# Patient Record
Sex: Female | Born: 1960 | Race: White | Hispanic: No | Marital: Married | Smoking: Never smoker
Health system: Southern US, Community
[De-identification: ages and names within clinical notes are randomized; demographics above are authoritative.]

## PROBLEM LIST (undated history)

## (undated) DIAGNOSIS — T7840XA Allergy, unspecified, initial encounter: Secondary | ICD-10-CM

## (undated) DIAGNOSIS — D1800 Hemangioma unspecified site: Secondary | ICD-10-CM

## (undated) DIAGNOSIS — C4491 Basal cell carcinoma of skin, unspecified: Secondary | ICD-10-CM

## (undated) DIAGNOSIS — R011 Cardiac murmur, unspecified: Secondary | ICD-10-CM

## (undated) DIAGNOSIS — M810 Age-related osteoporosis without current pathological fracture: Secondary | ICD-10-CM

## (undated) DIAGNOSIS — E785 Hyperlipidemia, unspecified: Secondary | ICD-10-CM

## (undated) DIAGNOSIS — F988 Other specified behavioral and emotional disorders with onset usually occurring in childhood and adolescence: Secondary | ICD-10-CM

## (undated) DIAGNOSIS — F419 Anxiety disorder, unspecified: Secondary | ICD-10-CM

## (undated) DIAGNOSIS — M1990 Unspecified osteoarthritis, unspecified site: Secondary | ICD-10-CM

## (undated) DIAGNOSIS — H538 Other visual disturbances: Secondary | ICD-10-CM

## (undated) DIAGNOSIS — D229 Melanocytic nevi, unspecified: Secondary | ICD-10-CM

## (undated) DIAGNOSIS — J302 Other seasonal allergic rhinitis: Secondary | ICD-10-CM

## (undated) DIAGNOSIS — H269 Unspecified cataract: Secondary | ICD-10-CM

## (undated) HISTORY — DX: Anxiety disorder, unspecified: F41.9

## (undated) HISTORY — DX: Allergy, unspecified, initial encounter: T78.40XA

## (undated) HISTORY — DX: Age-related osteoporosis without current pathological fracture: M81.0

## (undated) HISTORY — DX: Unspecified osteoarthritis, unspecified site: M19.90

## (undated) HISTORY — DX: Cardiac murmur, unspecified: R01.1

## (undated) HISTORY — DX: Other specified behavioral and emotional disorders with onset usually occurring in childhood and adolescence: F98.8

## (undated) HISTORY — PX: BREAST SURGERY: SHX581

## (undated) HISTORY — PX: BREAST EXCISIONAL BIOPSY: SUR124

## (undated) HISTORY — DX: Melanocytic nevi, unspecified: D22.9

## (undated) HISTORY — DX: Hyperlipidemia, unspecified: E78.5

## (undated) HISTORY — DX: Other visual disturbances: H53.8

## (undated) HISTORY — PX: EYE SURGERY: SHX253

## (undated) HISTORY — DX: Unspecified cataract: H26.9

## (undated) HISTORY — DX: Basal cell carcinoma of skin, unspecified: C44.91

---

## 1995-01-11 DIAGNOSIS — D229 Melanocytic nevi, unspecified: Secondary | ICD-10-CM

## 1995-01-11 HISTORY — DX: Melanocytic nevi, unspecified: D22.9

## 1997-12-23 ENCOUNTER — Other Ambulatory Visit: Admission: RE | Admit: 1997-12-23 | Discharge: 1997-12-23 | Payer: Self-pay | Admitting: Obstetrics and Gynecology

## 1998-01-05 ENCOUNTER — Inpatient Hospital Stay (HOSPITAL_COMMUNITY): Admission: AD | Admit: 1998-01-05 | Discharge: 1998-01-05 | Payer: Self-pay | Admitting: Gynecology

## 1998-01-06 ENCOUNTER — Ambulatory Visit (HOSPITAL_COMMUNITY): Admission: RE | Admit: 1998-01-06 | Discharge: 1998-01-06 | Payer: Self-pay | Admitting: Gynecology

## 1998-06-09 ENCOUNTER — Other Ambulatory Visit: Admission: RE | Admit: 1998-06-09 | Discharge: 1998-06-09 | Payer: Self-pay | Admitting: Obstetrics and Gynecology

## 1998-08-30 ENCOUNTER — Inpatient Hospital Stay (HOSPITAL_COMMUNITY): Admission: AD | Admit: 1998-08-30 | Discharge: 1998-08-30 | Payer: Self-pay | Admitting: Gynecology

## 1999-01-12 ENCOUNTER — Inpatient Hospital Stay (HOSPITAL_COMMUNITY): Admission: AD | Admit: 1999-01-12 | Discharge: 1999-01-15 | Payer: Self-pay | Admitting: Obstetrics and Gynecology

## 1999-02-19 ENCOUNTER — Other Ambulatory Visit: Admission: RE | Admit: 1999-02-19 | Discharge: 1999-02-19 | Payer: Self-pay | Admitting: Obstetrics and Gynecology

## 2000-03-22 ENCOUNTER — Other Ambulatory Visit: Admission: RE | Admit: 2000-03-22 | Discharge: 2000-03-22 | Payer: Self-pay | Admitting: Gynecology

## 2001-03-29 ENCOUNTER — Other Ambulatory Visit: Admission: RE | Admit: 2001-03-29 | Discharge: 2001-03-29 | Payer: Self-pay | Admitting: Gynecology

## 2001-06-18 ENCOUNTER — Other Ambulatory Visit: Admission: RE | Admit: 2001-06-18 | Discharge: 2001-06-18 | Payer: Self-pay | Admitting: Gynecology

## 2001-11-19 ENCOUNTER — Inpatient Hospital Stay (HOSPITAL_COMMUNITY): Admission: AD | Admit: 2001-11-19 | Discharge: 2001-11-19 | Payer: Self-pay | Admitting: Gynecology

## 2002-01-10 ENCOUNTER — Inpatient Hospital Stay (HOSPITAL_COMMUNITY): Admission: AD | Admit: 2002-01-10 | Discharge: 2002-01-14 | Payer: Self-pay | Admitting: Gynecology

## 2002-01-10 ENCOUNTER — Encounter (INDEPENDENT_AMBULATORY_CARE_PROVIDER_SITE_OTHER): Payer: Self-pay

## 2002-02-25 ENCOUNTER — Other Ambulatory Visit: Admission: RE | Admit: 2002-02-25 | Discharge: 2002-02-25 | Payer: Self-pay | Admitting: Gynecology

## 2002-09-09 ENCOUNTER — Encounter: Admission: RE | Admit: 2002-09-09 | Discharge: 2002-10-09 | Payer: Self-pay | Admitting: Gynecology

## 2003-05-23 ENCOUNTER — Encounter: Payer: Self-pay | Admitting: Family Medicine

## 2003-05-23 ENCOUNTER — Encounter: Admission: RE | Admit: 2003-05-23 | Discharge: 2003-05-23 | Payer: Self-pay | Admitting: Family Medicine

## 2003-07-21 ENCOUNTER — Encounter: Admission: RE | Admit: 2003-07-21 | Discharge: 2003-07-21 | Payer: Self-pay | Admitting: Gynecology

## 2003-11-04 ENCOUNTER — Encounter: Admission: RE | Admit: 2003-11-04 | Discharge: 2003-11-04 | Payer: Self-pay | Admitting: Family Medicine

## 2004-06-11 ENCOUNTER — Ambulatory Visit: Payer: Self-pay | Admitting: Internal Medicine

## 2004-06-16 ENCOUNTER — Other Ambulatory Visit: Admission: RE | Admit: 2004-06-16 | Discharge: 2004-06-16 | Payer: Self-pay | Admitting: Internal Medicine

## 2004-06-16 ENCOUNTER — Ambulatory Visit: Payer: Self-pay | Admitting: Internal Medicine

## 2004-08-16 ENCOUNTER — Encounter: Admission: RE | Admit: 2004-08-16 | Discharge: 2004-08-16 | Payer: Self-pay | Admitting: Gynecology

## 2005-07-07 ENCOUNTER — Ambulatory Visit: Payer: Self-pay | Admitting: Internal Medicine

## 2005-07-15 ENCOUNTER — Ambulatory Visit: Payer: Self-pay | Admitting: Internal Medicine

## 2005-07-15 ENCOUNTER — Encounter: Payer: Self-pay | Admitting: Internal Medicine

## 2005-07-15 ENCOUNTER — Other Ambulatory Visit: Admission: RE | Admit: 2005-07-15 | Discharge: 2005-07-15 | Payer: Self-pay | Admitting: Internal Medicine

## 2005-09-13 ENCOUNTER — Ambulatory Visit: Payer: Self-pay | Admitting: Internal Medicine

## 2005-10-26 ENCOUNTER — Encounter: Admission: RE | Admit: 2005-10-26 | Discharge: 2005-10-26 | Payer: Self-pay | Admitting: Internal Medicine

## 2006-10-03 ENCOUNTER — Ambulatory Visit: Payer: Self-pay | Admitting: Internal Medicine

## 2006-10-03 LAB — CONVERTED CEMR LAB
Albumin: 3.9 g/dL (ref 3.5–5.2)
Alkaline Phosphatase: 53 units/L (ref 39–117)
BUN: 14 mg/dL (ref 6–23)
Basophils Absolute: 0.1 10*3/uL (ref 0.0–0.1)
Calcium: 9.1 mg/dL (ref 8.4–10.5)
Eosinophils Absolute: 0.1 10*3/uL (ref 0.0–0.6)
Eosinophils Relative: 2.5 % (ref 0.0–5.0)
Glucose, Bld: 92 mg/dL (ref 70–99)
HCT: 40 % (ref 36.0–46.0)
Hemoglobin, Urine: NEGATIVE
Ketones, ur: NEGATIVE mg/dL
LDL Cholesterol: 111 mg/dL — ABNORMAL HIGH (ref 0–99)
MCHC: 35.3 g/dL (ref 30.0–36.0)
Monocytes Relative: 6.2 % (ref 3.0–11.0)
Neutrophils Relative %: 59.3 % (ref 43.0–77.0)
Potassium: 4.6 meq/L (ref 3.5–5.1)
RBC: 4.4 M/uL (ref 3.87–5.11)
RDW: 12.1 % (ref 11.5–14.6)
Sodium: 143 meq/L (ref 135–145)
Total Protein: 6.8 g/dL (ref 6.0–8.3)
Urobilinogen, UA: 1 (ref 0.0–1.0)

## 2006-10-12 ENCOUNTER — Ambulatory Visit: Payer: Self-pay | Admitting: Internal Medicine

## 2006-12-12 ENCOUNTER — Encounter: Admission: RE | Admit: 2006-12-12 | Discharge: 2006-12-12 | Payer: Self-pay | Admitting: Internal Medicine

## 2006-12-19 ENCOUNTER — Encounter: Admission: RE | Admit: 2006-12-19 | Discharge: 2006-12-19 | Payer: Self-pay | Admitting: Internal Medicine

## 2007-02-14 ENCOUNTER — Other Ambulatory Visit: Admission: RE | Admit: 2007-02-14 | Discharge: 2007-02-14 | Payer: Self-pay | Admitting: Gynecology

## 2007-07-04 ENCOUNTER — Telehealth: Payer: Self-pay | Admitting: Internal Medicine

## 2007-07-30 ENCOUNTER — Encounter: Payer: Self-pay | Admitting: Pulmonary Disease

## 2007-07-31 ENCOUNTER — Telehealth: Payer: Self-pay | Admitting: Internal Medicine

## 2007-08-01 ENCOUNTER — Encounter: Payer: Self-pay | Admitting: Internal Medicine

## 2007-08-05 ENCOUNTER — Encounter: Admission: RE | Admit: 2007-08-05 | Discharge: 2007-08-05 | Payer: Self-pay | Admitting: Gastroenterology

## 2007-08-10 ENCOUNTER — Ambulatory Visit: Payer: Self-pay | Admitting: Pulmonary Disease

## 2007-08-20 DIAGNOSIS — J309 Allergic rhinitis, unspecified: Secondary | ICD-10-CM

## 2007-08-30 ENCOUNTER — Encounter: Payer: Self-pay | Admitting: Internal Medicine

## 2007-11-09 ENCOUNTER — Encounter: Payer: Self-pay | Admitting: Internal Medicine

## 2007-12-27 ENCOUNTER — Encounter: Admission: RE | Admit: 2007-12-27 | Discharge: 2007-12-27 | Payer: Self-pay | Admitting: Gynecology

## 2008-01-03 ENCOUNTER — Ambulatory Visit: Payer: Self-pay | Admitting: Internal Medicine

## 2008-01-03 LAB — CONVERTED CEMR LAB
ALT: 19 units/L (ref 0–35)
AST: 22 units/L (ref 0–37)
Alkaline Phosphatase: 77 units/L (ref 39–117)
Basophils Absolute: 0 10*3/uL (ref 0.0–0.1)
Bilirubin, Direct: 0.1 mg/dL (ref 0.0–0.3)
CO2: 27 meq/L (ref 19–32)
Creatinine, Ser: 0.8 mg/dL (ref 0.4–1.2)
Direct LDL: 151.2 mg/dL
Eosinophils Relative: 2.9 % (ref 0.0–5.0)
GFR calc non Af Amer: 81 mL/min
Glucose, Bld: 84 mg/dL (ref 70–99)
HCT: 40.2 % (ref 36.0–46.0)
HDL: 46.7 mg/dL (ref >39.0)
Lymphocytes Relative: 29.5 % (ref 12.0–46.0)
MCHC: 33.5 g/dL (ref 30.0–36.0)
MCV: 93.4 fL (ref 78.0–100.0)
Monocytes Absolute: 0.4 10*3/uL (ref 0.1–1.0)
Monocytes Relative: 6.1 % (ref 3.0–12.0)
Neutro Abs: 3.8 10*3/uL (ref 1.4–7.7)
Neutrophils Relative %: 61.2 % (ref 43.0–77.0)
Platelets: 207 10*3/uL (ref 150–400)
RDW: 11.8 % (ref 11.5–14.6)
TSH: 1.18 microintl units/mL (ref 0.35–5.50)
Total CHOL/HDL Ratio: 4.6
Total Protein, Urine: NEGATIVE mg/dL
Triglycerides: 47 mg/dL (ref 0–149)
Urine Glucose: NEGATIVE mg/dL
VLDL: 9 mg/dL (ref 0–40)
pH: 5.5 (ref 5.0–8.0)

## 2008-01-08 ENCOUNTER — Ambulatory Visit: Payer: Self-pay | Admitting: Internal Medicine

## 2008-01-08 DIAGNOSIS — E785 Hyperlipidemia, unspecified: Secondary | ICD-10-CM

## 2008-01-08 DIAGNOSIS — K219 Gastro-esophageal reflux disease without esophagitis: Secondary | ICD-10-CM

## 2008-02-28 ENCOUNTER — Encounter: Payer: Self-pay | Admitting: Internal Medicine

## 2008-03-04 ENCOUNTER — Other Ambulatory Visit: Admission: RE | Admit: 2008-03-04 | Discharge: 2008-03-04 | Payer: Self-pay | Admitting: Gynecology

## 2008-04-30 DIAGNOSIS — C4491 Basal cell carcinoma of skin, unspecified: Secondary | ICD-10-CM

## 2008-04-30 HISTORY — DX: Basal cell carcinoma of skin, unspecified: C44.91

## 2008-07-15 ENCOUNTER — Encounter: Admission: RE | Admit: 2008-07-15 | Discharge: 2008-07-15 | Payer: Self-pay | Admitting: Gastroenterology

## 2008-10-15 ENCOUNTER — Telehealth: Payer: Self-pay | Admitting: Pulmonary Disease

## 2008-10-21 ENCOUNTER — Telehealth (INDEPENDENT_AMBULATORY_CARE_PROVIDER_SITE_OTHER): Payer: Self-pay | Admitting: *Deleted

## 2009-03-17 ENCOUNTER — Other Ambulatory Visit: Admission: RE | Admit: 2009-03-17 | Discharge: 2009-03-17 | Payer: Self-pay | Admitting: Internal Medicine

## 2009-10-15 LAB — HM COLONOSCOPY

## 2009-12-17 ENCOUNTER — Encounter: Admission: RE | Admit: 2009-12-17 | Discharge: 2009-12-17 | Payer: Self-pay | Admitting: Internal Medicine

## 2009-12-23 ENCOUNTER — Encounter: Admission: RE | Admit: 2009-12-23 | Discharge: 2009-12-23 | Payer: Self-pay | Admitting: Gastroenterology

## 2010-03-17 ENCOUNTER — Other Ambulatory Visit: Admission: RE | Admit: 2010-03-17 | Discharge: 2010-03-17 | Payer: Self-pay | Admitting: Internal Medicine

## 2010-07-12 ENCOUNTER — Encounter: Admission: RE | Admit: 2010-07-12 | Discharge: 2010-07-12 | Payer: Self-pay | Admitting: Gastroenterology

## 2010-12-21 NOTE — Assessment & Plan Note (Signed)
[facility]                                 ON-CALL NOTE   Amanda Salazar, Amanda Salazar                     MRN:          [id number]  DATE:03/01/2008                            DOB:          11/01/1960    REGULAR DOCTOR:  Corwin Levins, MD   ON CALLIdamae Schuller A. Tower, MD   CHIEF COMPLAINT:  Urinary tract infection.   She states she used to have a lot of urinary tract infections.  In fact,  she had a workup from a urologist in the past.  She did not have one in  a long time.  She is starting to feel some discomfort in the bladder  area and some discomfort with voiding.  No fever, back pain, vomiting,  or other symptoms.  She wants me to call in an antibiotic, preferably  Macrobid, to the pharmacy for her to start taking.  I have told her that  I cannot prescribe antibiotics over the phone for urinary tract  infection, that she needs a urine test and possibly culture.  She would  like to know what other options she had for helping her symptoms in the  meantime, as she is giving a party to night and cannot go to Urgent  Care.  I have told her to try Azo over-the-counter, as this may help her  symptoms and would treat the infection, but may help her with her  urinary discomfort and told she can be seen.  I have told to drink lots  and lots of water.  She can even drink some cranberry juice to help  acidify the urine and advised her further to go to Urgent Care when she  is able to get a urine specimen and be treated for this.  Also advised  her that if she begins to develop back pain, nausea, vomiting, or fever,  come to the emergency room immediately.     Marne A. Tower, MD  Electronically Signed    MAT/MedQ  DD: 03/01/2008  DT: 03/01/2008  Job #: [id number]

## 2010-12-24 NOTE — H&P (Signed)
[facility]  Patient:    Amanda Salazar, Amanda Salazar Visit Number: [id number] MRN: [id number]          Service Type: OBS Location: [facility] Attending Physician:  Tonye Royalty Dictated by:   Nadyne Coombes. Fontaine, M.D. Admit Date:  11/19/2001 Discharge Date: 11/19/2001                           History and Physical  CHIEF COMPLAINT:              1. Pregnancy at 37-38 weeks.                               2. History of three prior cesarean sections with                                  increasing bouts of prodromal labor.                               3. Denies permanent sterilization.  HISTORY OF PRESENT ILLNESS:   A 50 year old G5, P3 female at 67-[redacted] weeks gestation with increasing bouts of contractions for scheduled repeat cesarean section and tubal sterilization.  Due to the frequent bouts of contractions and for fear of uterine rupture, the patient is admitted at 37-38 weeks instead of the 39 weeks for repeat section/BTL.  PAST MEDICAL HISTORY:         Unremarkable.  PAST SURGICAL HISTORY:        Cesarean section x3, D&C, breast biopsy.  ALLERGIES:                    None.  REVIEW OF SYSTEMS:            Noncontributory.  SOCIAL HISTORY:               Noncontributory.  FAMILY HISTORY:               Noncontributory.  PHYSICAL EXAMINATION:  VITAL SIGNS:                  Afebrile.  Vital signs are stable.  HEENT:                        Normal.  LUNGS:                        Clear.  CARDIAC:                      Regular rate.  No rubs, murmurs, or gallops.  ABDOMEN:                      Gravid, vertex fetus.  Positive fetal heart tones.  Cervix difficult to palpate due to fetal head low in the pelvis, not grossly dilated.  ASSESSMENT:                   A 50 year old G5, P3 female at 56-[redacted] weeks gestation, three prior cesarean sections for repeat cesarean section, tubal sterilization.  I reviewed with the patient the risks, benefits,  indications, and alternatives for repeat cesarean section and tubal sterilization.  I discussed the risks of infection, antibiotics,  wound complications, opening and draining of incisions, closure by secondary intention, the risk of hemorrhage necessitating transfusion and the risk of transfusion, the risk of inadvertent injury to internal organs including bowel, bladder, ureters, vessels, and nerves necessitating major exploratory reparative surgeries, future reparative surgeries including ostomy formation were all discussed, understood, and accepted.  The risks of fetal injury including musculoskeletal, neurologic, scalpel injuries were all discussed, understood, and accepted.  The patient understands that tubal sterilization is a permanent procedure, although she also understands that there is a risk of failure and she accepts this risk. Dictated by:   Nadyne Coombes. Fontaine, M.D. Attending Physician:  Tonye Royalty DD:  01/09/02 TD:  01/10/02 Job: [phone] [id number]

## 2010-12-24 NOTE — Op Note (Signed)
[facility]  Patient:    Amanda Salazar, Amanda Salazar Visit Number: [id number] MRN: [id number]          Service Type: OBS Location: 910A 9134 01 Attending Physician:  Merrily Pew Dictated by:   Nadyne Coombes Fontaine, M.D. Proc. Date: 01/10/02 Admit Date:  01/10/2002                             Operative Report  PREOPERATIVE DIAGNOSES:       1. Pregnancy near term.                               2. Prior cesarean section, desires repeat                                  cesarean section.                               3. Multiparous, desires permanent sterilization.   POSTOPERATIVE DIAGNOSES:      1. Pregnancy near term.                               2. Prior cesarean section, desires repeat                                  cesarean section.                               3. Multiparous, desires permanent sterilization.  OPERATION:                    1. Repeat low transverse cervical                                  cesarean section.                               2. Bilateral tubal sterilization, modified                                  Pomeroy technique.  SURGEON:                      Timothy P. Fontaine, M.D.  ASSISTANT:                    Douglass Rivers, M.D.  ANESTHESIA:                   Spinal.  ESTIMATED BLOOD LOSS:         Less than 500 cc.  COMPLICATIONS:                None.  SPECIMENS:                    1. Samples of cord blood.  2. Portions of right fallopian tube.                               3. Portions of left fallopian tube.  FINDINGS:                     At 1332 hours normal female infant, Apgars 8 and 9, weight 8 pounds 0 ounces.  Pelvic anatomy noted to be normal.  DESCRIPTION OF PROCEDURE:     The patient was taken to the operating room and underwent spinal anesthesia.  She was placed in the left tilt, supine position, received an abdominal perineal preparation with Betadine solution. The  bladder was emptied with an indwelling Foley catheterization placed in a sterile technique. The patient was draped in the usual fashion, and after assuring adequate abdominal anesthesia, the abdomen was sharply entered through a repeat Pfannenstiel incision. The bladder flap was sharply and bluntly developed without difficulty, and inspection of the lower uterine segment showed a very attenuated lower uterine segment with transillumination of the fluid. The initial lower uterine segment incision ruptured the membranes due to the thinness of the segment, and this was bluntly extended laterally. The infants head was then delivered through the incision with the assistance of the vacuum extractor.  The nares and mouth were suctioned.  The rest of the infant was delivered.  The cord was doubly clamped and cut, and the infant handed to pediatrics in attendance.  Samples of cord blood were obtained.  The placenta was then spontaneously extruded and noted to be intact.  The uterus was exteriorized and the endometrial cavity explored with a sponge to remove all placental membrane fragments.  The patient received 1 g Ancef antibiotic prophylaxis. The uterine incision was then closed in one layer using 0 Vicryl suture in a running interlocking stitch.  The left fallopian tube was identified, traced from its insertion to its fimbriated end, and a mid tubal segment was then doubly ligated using 0 plain suture, and the intervening segment sharply excised. Tubal lumen, as well as adequate hemostasis was grossly visualized. A similar procedure was carried out on the other side. The uterus was then returned to the abdomen which was copiously irrigated. Adequate hemostasis was visualized. Both tubal sites reinspected showing adequate hemostasis and the anterior fascia reapproximated using 0 Vicryl suture in a running stitch. The subcutaneous tissues were irrigated, hemostasis achieved with electrocautery, and  the skin was then reapproximated with staples. A sterile dressing was applied and the patient was taken to the recovery room in good condition having tolerated the procedure well. Dictated by:   Nadyne Coombes. Fontaine, M.D. Attending Physician:  Merrily Pew DD:  01/10/02 TD:  01/14/02 Job: [id number]

## 2010-12-24 NOTE — Assessment & Plan Note (Signed)
[facility]                           PRIMARY CARE OFFICE NOTE   BODHI, STENGLEIN                     MRN:          [id number]  DATE:10/12/2006                            DOB:          04-25-1961    Amanda Salazar is a delightful 50 year old woman who presents today for  routine examination.  She was last seen in the office September 13, 2005  for headache and tooth pain, diagnosed by Dr. Jonny Ruiz as having sinusitis.  Treated with Ceftin b.i.d. with good results.  The patient's last  general physical exam was July 15, 2005, please see that note.  Last  pelvic and Pap smear was done at that time and was a normal study.   CHIEF COMPLAINT:  The patient reports that her allergies are flaring up  a little bit and she has restarted Allegra and is going to be taking her  Nasonex.  She also had 2 children that had influenza-like illness,  although she herself did not come down with influenza symptoms.   PAST MEDICAL HISTORY:  SURGICAL:  1. Breast biopsies in 1986, 1987, 1997, all benign.  2. C-section x4 - 1996, 1998, 2000, 2003.  3. Patient had Radio Keratotomy in 1992.   MEDICAL ILLNESS:  1. Usual childhood disease.  2. Heartburn with history of ulcers in the past.  3. Adolescent heart murmur, resolved.  4. Hay fever and allergies.  5. 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   CURRENT MEDICATIONS:  Nabumetone 500 mg daily.   PHYSICIAN ROSTER:  1. Dr. Stefani Dama, Neurosurgery.  2. Dr. Ria Bush. [facility] for Dermatology.   FAMILY HISTORY:  Positive for arthritis in her mother, positive for  colon cancer in her paternal grandmother.  Positive for hyperlipidemia  and hypertension in her mother.  Positive for maternal aunt with kidney  disease.  There is no family history for breast cancer, lung cancer, or  coronary artery disease.   SOCIAL HISTORY:  The patient has a  degree in Patent attorney from  [employer], an [facility] from [employer].  She worked in  Set designer for 21 years.  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   REVIEW OF SYSTEMS:  Negative for any constitutional problems.  She has  an eye appointment in 2 weeks with her ophthalmologist.  No ENT,  cardiovascular, respiratory, GI, or GU complaints.   EXAMINATION:  Temperature was 98.2, blood pressure 98/59, pulse was 73,  weight 112.  GENERAL APPEARANCE:  This is a slender woman in no acute distress.  HEENT:  Normocephalic/atraumatic, EACs and TMs were normal.  Oropharynx  with native dentition in good repair, no buccal or palate lesions were  noted.  Posterior pharynx was clear.  Conjunctivae and sclerae was  clear.  Further exam deferred to ophthalmology.  NECK:  Supple without thyromegaly.  NODES:  No adenopathy was noted in the cervical, supraclavicular,  or  axillary regions.  CHEST:  No CVA tenderness, no deformities.  LUNGS:  Clear with no rales, wheezes, or rhonchi.  BREAST:  Skin was normal, there has been some mild tissue loss, nipples  without discharge.  The patient had a 3-cm cystic structure at the 10  o'clock position on the left breast.  No other abnormalities were found.  CARDIOVASCULAR:  Was 2+ radial pulse with no JVD or carotid bruits.  She  had a quiet precordium with regular rate and rhythm without murmurs,  rubs, or gallops.  ABDOMEN:  Soft, no guarding or rebound.  No organosplenomegaly was  appreciated.  PELVIC:  Deferred until her next annual exam.  EXTREMITIES:  Without clubbing, cyanosis, edema, or deformities   DATABASE:  Hemoglobin was 14.1 grams, white count with 5800 with a  normal differential.  Chemistries were normal with a blood sugar of 92.  Kidney functions and liver functions were normal.  Cholesterol 184,  triglycerides 74, HDL 58.2, LDL  was 111.  Thyroid function normal with a  TSH of 1.64.  Urinalysis was negative.   ASSESSMENT/PLAN:  1. Allergies.  The patient to continue with Allegra and the Nasonex as      needed.  2. Musculoskeletal.  The patient can use the nabumetone as needed but      generally she gets by without.  She is going to increase her      exercise quotient.  3. Health maintenance.  The patient was with a normal examination.      She was reassured that with a normal Pap smear that going 2 years      that was definitely safe as recommended by ACOG.  The patient's      last mammogram was October 26, 2005 and she will be scheduling her      followup mammogram on her own.   In summary is a very pleasant woman who is medically stable at this  time.  She has asked to return to see me on an as needed basis,  otherwise I will see her in 1 year for routine followup.     Rosalyn Gess Norins, MD  Electronically Signed    MEN/MedQ  DD: 10/12/2006  DT: 10/12/2006  Job #: [id number]   cc:   Sherrie Sport
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
## 2011-02-16 ENCOUNTER — Other Ambulatory Visit: Payer: Self-pay | Admitting: Internal Medicine

## 2011-02-16 DIAGNOSIS — Z1231 Encounter for screening mammogram for malignant neoplasm of breast: Secondary | ICD-10-CM

## 2011-02-23 ENCOUNTER — Ambulatory Visit
Admission: RE | Admit: 2011-02-23 | Discharge: 2011-02-23 | Disposition: A | Discharge status: Home / Self Care | Payer: 59 | Source: Ambulatory Visit | Attending: Internal Medicine | Admitting: Internal Medicine

## 2011-02-23 DIAGNOSIS — Z1231 Encounter for screening mammogram for malignant neoplasm of breast: Secondary | ICD-10-CM

## 2011-06-06 ENCOUNTER — Other Ambulatory Visit: Payer: Self-pay | Admitting: Dermatology

## 2011-06-14 ENCOUNTER — Ambulatory Visit: Payer: 59 | Admitting: Gynecology

## 2011-12-14 ENCOUNTER — Encounter: Payer: Self-pay | Admitting: Gastroenterology

## 2012-02-06 ENCOUNTER — Other Ambulatory Visit: Payer: Self-pay | Admitting: Gynecology

## 2012-02-06 ENCOUNTER — Other Ambulatory Visit: Payer: Self-pay | Admitting: Gastroenterology

## 2012-02-06 DIAGNOSIS — D1800 Hemangioma unspecified site: Secondary | ICD-10-CM

## 2012-02-06 DIAGNOSIS — Z1231 Encounter for screening mammogram for malignant neoplasm of breast: Secondary | ICD-10-CM

## 2012-02-15 ENCOUNTER — Other Ambulatory Visit: Payer: 59

## 2012-02-20 ENCOUNTER — Other Ambulatory Visit: Payer: 59

## 2012-02-27 ENCOUNTER — Ambulatory Visit
Admission: RE | Admit: 2012-02-27 | Discharge: 2012-02-27 | Disposition: A | Discharge status: Home / Self Care | Payer: 59 | Source: Ambulatory Visit | Attending: Gynecology | Admitting: Gynecology

## 2012-02-27 DIAGNOSIS — Z1231 Encounter for screening mammogram for malignant neoplasm of breast: Secondary | ICD-10-CM

## 2012-02-28 ENCOUNTER — Other Ambulatory Visit: Payer: 59

## 2012-03-14 ENCOUNTER — Ambulatory Visit
Admission: RE | Admit: 2012-03-14 | Discharge: 2012-03-14 | Disposition: A | Discharge status: Home / Self Care | Payer: 59 | Source: Ambulatory Visit | Attending: Gastroenterology | Admitting: Gastroenterology

## 2012-03-14 ENCOUNTER — Other Ambulatory Visit: Payer: Self-pay | Admitting: Gastroenterology

## 2012-03-14 ENCOUNTER — Other Ambulatory Visit: Payer: 59

## 2012-03-14 DIAGNOSIS — D1800 Hemangioma unspecified site: Secondary | ICD-10-CM

## 2012-04-17 ENCOUNTER — Other Ambulatory Visit: Payer: Self-pay | Admitting: Internal Medicine

## 2012-04-17 DIAGNOSIS — Z1231 Encounter for screening mammogram for malignant neoplasm of breast: Secondary | ICD-10-CM

## 2012-05-03 ENCOUNTER — Ambulatory Visit: Payer: 59

## 2012-06-01 ENCOUNTER — Emergency Department (HOSPITAL_COMMUNITY): Payer: 59

## 2012-06-01 ENCOUNTER — Emergency Department (HOSPITAL_COMMUNITY)
Admission: EM | Admit: 2012-06-01 | Discharge: 2012-06-01 | Disposition: A | Discharge status: Home / Self Care | Payer: 59 | Attending: Emergency Medicine | Admitting: Emergency Medicine

## 2012-06-01 ENCOUNTER — Encounter (HOSPITAL_COMMUNITY): Payer: Self-pay | Admitting: Emergency Medicine

## 2012-06-01 DIAGNOSIS — J301 Allergic rhinitis due to pollen: Secondary | ICD-10-CM | POA: Insufficient documentation

## 2012-06-01 DIAGNOSIS — IMO0002 Reserved for concepts with insufficient information to code with codable children: Secondary | ICD-10-CM | POA: Insufficient documentation

## 2012-06-01 DIAGNOSIS — Y9389 Activity, other specified: Secondary | ICD-10-CM | POA: Insufficient documentation

## 2012-06-01 DIAGNOSIS — S060XAA Concussion with loss of consciousness status unknown, initial encounter: Secondary | ICD-10-CM | POA: Insufficient documentation

## 2012-06-01 DIAGNOSIS — S0081XA Abrasion of other part of head, initial encounter: Secondary | ICD-10-CM

## 2012-06-01 DIAGNOSIS — R112 Nausea with vomiting, unspecified: Secondary | ICD-10-CM | POA: Insufficient documentation

## 2012-06-01 DIAGNOSIS — Z79899 Other long term (current) drug therapy: Secondary | ICD-10-CM | POA: Insufficient documentation

## 2012-06-01 DIAGNOSIS — Y92410 Unspecified street and highway as the place of occurrence of the external cause: Secondary | ICD-10-CM | POA: Insufficient documentation

## 2012-06-01 DIAGNOSIS — Z8505 Personal history of malignant neoplasm of liver: Secondary | ICD-10-CM | POA: Insufficient documentation

## 2012-06-01 DIAGNOSIS — S060X9A Concussion with loss of consciousness of unspecified duration, initial encounter: Secondary | ICD-10-CM

## 2012-06-01 DIAGNOSIS — S6000XA Contusion of unspecified finger without damage to nail, initial encounter: Secondary | ICD-10-CM | POA: Insufficient documentation

## 2012-06-01 HISTORY — DX: Hemangioma unspecified site: D18.00

## 2012-06-01 HISTORY — DX: Other seasonal allergic rhinitis: J30.2

## 2012-06-01 MED ORDER — SODIUM CHLORIDE 0.9 % IV SOLN: 1000.0000 mL | INTRAVENOUS | Status: DC

## 2012-06-01 MED ORDER — ONDANSETRON HCL 4 MG/2ML IJ SOLN
4.0000 mg | Freq: Once | INTRAMUSCULAR | Status: AC
  Administered 2012-06-01: 4 mg via INTRAVENOUS
  Filled 2012-06-01: qty 2

## 2012-06-01 MED ORDER — ONDANSETRON 8 MG PO TBDP: 8.0000 mg | ORAL_TABLET | Freq: Three times a day (TID) | ORAL | Status: DC | PRN

## 2012-06-01 MED ORDER — ONDANSETRON 4 MG PO TBDP
8.0000 mg | ORAL_TABLET | Freq: Once | ORAL | Status: AC
  Administered 2012-06-01: 8 mg via ORAL

## 2012-06-01 MED ORDER — ONDANSETRON 4 MG PO TBDP
ORAL_TABLET | ORAL | Status: AC
  Filled 2012-06-01: qty 2

## 2012-06-01 MED ORDER — HYDROCODONE-ACETAMINOPHEN 5-325 MG PO TABS: 1.0000 | ORAL_TABLET | Freq: Four times a day (QID) | ORAL | Status: DC | PRN

## 2012-06-01 MED ORDER — MORPHINE SULFATE 4 MG/ML IJ SOLN
4.0000 mg | Freq: Once | INTRAMUSCULAR | Status: AC
  Administered 2012-06-01: 4 mg via INTRAVENOUS
  Filled 2012-06-01: qty 1

## 2012-06-01 MED ORDER — SODIUM CHLORIDE 0.9 % IV SOLN
1000.0000 mL | Freq: Once | INTRAVENOUS | Status: AC
  Administered 2012-06-01: 1000 mL via INTRAVENOUS
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
## 2012-06-01 NOTE — ED Notes (Signed)
Report received from [place], [place].  Pt ready to be discharged only needs help changing clothes and wheelchair assistance to car.  Husband at bedside.  Pt already has discharge paperwork and IV d/c prior to this RN.  Assisted pt to change into gown.  Warm blanket given.  Pt assisted into wheelchair and assisted to car.  Questions answered.  Pt still c/o nausea.  Pt has her Rx x 2 and informed of 24 hour pharmacy if she needs to have them filled there.

## 2012-06-01 NOTE — ED Notes (Signed)
Antibiotic ointment administered to forehead.
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
## 2012-06-01 NOTE — ED Notes (Signed)
Pt to CT at this time.

## 2012-06-01 NOTE — ED Notes (Signed)
[employer] at bedside 
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
## 2012-06-01 NOTE — ED Notes (Signed)
Dr Knapp at pt bedside 

## 2013-04-25 ENCOUNTER — Other Ambulatory Visit: Payer: Self-pay

## 2013-04-25 ENCOUNTER — Other Ambulatory Visit (HOSPITAL_COMMUNITY)
Admission: RE | Admit: 2013-04-25 | Discharge: 2013-04-25 | Disposition: A | Discharge status: Home / Self Care | Payer: 59 | Source: Ambulatory Visit | Attending: Internal Medicine | Admitting: Internal Medicine

## 2013-04-25 DIAGNOSIS — Z01419 Encounter for gynecological examination (general) (routine) without abnormal findings: Secondary | ICD-10-CM | POA: Insufficient documentation

## 2013-04-25 DIAGNOSIS — Z1151 Encounter for screening for human papillomavirus (HPV): Secondary | ICD-10-CM | POA: Insufficient documentation

## 2013-06-24 ENCOUNTER — Encounter: Payer: Self-pay | Admitting: Physician Assistant

## 2013-06-25 ENCOUNTER — Ambulatory Visit: Payer: Self-pay | Admitting: Physician Assistant

## 2013-07-23 ENCOUNTER — Encounter: Payer: Self-pay | Admitting: Physician Assistant

## 2013-07-23 ENCOUNTER — Ambulatory Visit (INDEPENDENT_AMBULATORY_CARE_PROVIDER_SITE_OTHER): Payer: 59 | Admitting: Physician Assistant

## 2013-07-23 VITALS — BP 100/60 | HR 68 | Temp 97.9°F | Resp 16 | Wt 113.0 lb

## 2013-07-23 DIAGNOSIS — F411 Generalized anxiety disorder: Secondary | ICD-10-CM

## 2013-07-23 MED ORDER — CITALOPRAM HYDROBROMIDE 10 MG PO TABS: 10.0000 mg | ORAL_TABLET | Freq: Every day | ORAL | Status: DC

## 2013-07-23 NOTE — Progress Notes (Signed)
HPI Patient presents for what was suppose to be a one month follow up. She had a CPE in Sept 18 and was started on Celexa for anxiety and states she thought it was too high of a dose at 20mg . When she was on the 20 mg she states she felt flighty and paranoid, so far on the half of a dose she has been being better and not feeling an anxious.   Past Medical History  Diagnosis Date  . Seasonal allergies   . Hemangioma     on liver     Allergies  Allergen Reactions  . Erythromycin Nausea And Vomiting      Current Outpatient Prescriptions on File Prior to Visit  Medication Sig Dispense Refill  . CALCIUM PO Take 1 tablet by mouth daily.      . Cholecalciferol (VITAMIN D3 MAXIMUM STRENGTH) 5000 UNITS capsule Take 5,000 Units by mouth daily.      . cycloSPORINE (RESTASIS) 0.05 % ophthalmic emulsion Place 1 drop into both eyes 2 (two) times daily.      . nabumetone (RELAFEN) 500 MG tablet Take 500 mg by mouth 2 (two) times daily as needed. For pain      . Omega-3 Fatty Acids (FISH OIL) 1200 MG CAPS Take 1,200 mg by mouth daily.      Marland Kitchen PRESCRIPTION MEDICATION Place 1 drop into both eyes 4 (four) times daily. Albumin preservative free 10% opthalmic solution  Compounded at [facility]       No current facility-administered medications on file prior to visit.    ROS: all negative expect above.   Physical: Filed Weights   07/23/13 1214  Weight: 113 lb (51.256 kg)   Filed Vitals:   07/23/13 1214  BP: 100/60  Pulse: 68  Temp: 97.9 F (36.6 C)  Resp: 16   General Appearance: Well nourished, in no apparent distress. Eyes: PERRLA, EOMs. Sinuses: No Frontal/maxillary tenderness ENT/Mouth: Ext aud canals clear, normal light reflex with TMs without erythema, bulging. Post pharynx without erythema, swelling, exudate.  Respiratory: CTAB Cardio: RRR, no murmurs, rubs or gallops. Peripheral pulses brisk and equal bilaterally, without edema. No aortic or femoral bruits. Abdomen: Soft,  with bowl sounds. Nontender, no guarding, rebound. Lymphatics: Non tender without lymphadenopathy.  Musculoskeletal: Full ROM all peripheral extremities, 5/5 strength, and normal gait. Skin: Warm, dry without rashes, lesions, ecchymosis.  Neuro: Cranial nerves intact, reflexes equal bilaterally. Normal muscle tone, no cerebellar symptoms. Sensation intact.  Pysch: Awake and oriented X 3, normal affect, Insight and Judgment appropriate.   Assessment and Plan: Anxiety- continue celexa but at half the dose= 10mg  QD Call or follow up in the office PRN

## 2013-07-23 NOTE — Patient Instructions (Signed)

## 2013-08-06 ENCOUNTER — Other Ambulatory Visit: Payer: Self-pay | Admitting: Dermatology

## 2013-10-07 ENCOUNTER — Other Ambulatory Visit: Payer: Self-pay | Admitting: Physician Assistant

## 2013-10-07 MED ORDER — NABUMETONE 500 MG PO TABS: 500.0000 mg | ORAL_TABLET | Freq: Two times a day (BID) | ORAL | Status: DC | PRN

## 2013-10-09 ENCOUNTER — Other Ambulatory Visit: Payer: Self-pay | Admitting: Physician Assistant

## 2014-04-25 ENCOUNTER — Ambulatory Visit: Payer: Self-pay | Admitting: Physician Assistant

## 2014-04-29 ENCOUNTER — Encounter: Payer: Self-pay | Admitting: Physician Assistant

## 2014-04-29 ENCOUNTER — Ambulatory Visit: Payer: Self-pay | Admitting: Physician Assistant

## 2014-04-29 ENCOUNTER — Ambulatory Visit (INDEPENDENT_AMBULATORY_CARE_PROVIDER_SITE_OTHER): Payer: BC Managed Care – PPO | Admitting: Physician Assistant

## 2014-04-29 VITALS — BP 100/62 | HR 72 | Temp 97.9°F | Resp 16 | Ht 62.0 in | Wt 117.0 lb

## 2014-04-29 DIAGNOSIS — R922 Inconclusive mammogram: Secondary | ICD-10-CM

## 2014-04-29 DIAGNOSIS — C4491 Basal cell carcinoma of skin, unspecified: Secondary | ICD-10-CM

## 2014-04-29 DIAGNOSIS — Z0000 Encounter for general adult medical examination without abnormal findings: Secondary | ICD-10-CM

## 2014-04-29 DIAGNOSIS — F411 Generalized anxiety disorder: Secondary | ICD-10-CM

## 2014-04-29 LAB — CBC WITH DIFFERENTIAL/PLATELET
BASOS ABS: 0 10*3/uL (ref 0.0–0.1)
BASOS PCT: 0 % (ref 0–1)
EOS PCT: 2 % (ref 0–5)
Eosinophils Absolute: 0.1 10*3/uL (ref 0.0–0.7)
HEMATOCRIT: 40.1 % (ref 36.0–46.0)
HEMOGLOBIN: 14.2 g/dL (ref 12.0–15.0)
Lymphocytes Relative: 29 % (ref 12–46)
Lymphs Abs: 1.8 10*3/uL (ref 0.7–4.0)
MCH: 31.7 pg (ref 26.0–34.0)
MCHC: 35.4 g/dL (ref 30.0–36.0)
MCV: 89.5 fL (ref 78.0–100.0)
MONO ABS: 0.4 10*3/uL (ref 0.1–1.0)
MONOS PCT: 6 % (ref 3–12)
NEUTROS ABS: 4 10*3/uL (ref 1.7–7.7)
Neutrophils Relative %: 63 % (ref 43–77)
Platelets: 222 10*3/uL (ref 150–400)
RBC: 4.48 MIL/uL (ref 3.87–5.11)
RDW: 12.8 % (ref 11.5–15.5)
WBC: 6.3 10*3/uL (ref 4.0–10.5)

## 2014-04-29 LAB — HEMOGLOBIN A1C
Hgb A1c MFr Bld: 5.3 % (ref <5.7)
MEAN PLASMA GLUCOSE: 105 mg/dL (ref <117)

## 2014-04-29 MED ORDER — ALPRAZOLAM 0.5 MG PO TABS
ORAL_TABLET | ORAL | Status: DC
Start: 2014-04-29 — End: 2015-06-03

## 2014-04-29 MED ORDER — PROMETHAZINE HCL 25 MG PO TABS: 12.5000 mg | ORAL_TABLET | Freq: Four times a day (QID) | ORAL | Status: DC | PRN

## 2014-04-29 MED ORDER — CIPROFLOXACIN HCL 500 MG PO TABS: 500.0000 mg | ORAL_TABLET | Freq: Two times a day (BID) | ORAL | Status: AC

## 2014-04-29 MED ORDER — HYOSCYAMINE SULFATE 0.125 MG SL SUBL: 0.1250 mg | SUBLINGUAL_TABLET | SUBLINGUAL | Status: DC | PRN

## 2014-04-29 MED ORDER — BUPROPION HCL ER (XL) 150 MG PO TB24: 150.0000 mg | ORAL_TABLET | ORAL | Status: DC

## 2014-04-29 NOTE — Patient Instructions (Addendum)
If you are traveling you can take these medications to be more prepared. If you get chest pain, shortness of breath or abdominal pain please go to the hospital wherever you may be.   Ciprofloxacin is good for travelers diarrhea, you can take 2 pills a day for 7 days. Or it is also good for urinary tract infections, you can take 2 a day for 7 days.  Phenergran is for nausea but it sedating so plan on eating and sleeping.  Levsin is good for nausea, diarrhea, or abdominal cramping- it can constipate you so don't take too much. This dissolves under your tongue.   Preventative Care for Adults - Female      MAINTAIN REGULAR HEALTH EXAMS:  A routine yearly physical is a good way to check in with your primary care provider about your health and preventive screening. It is also an opportunity to share updates about your health and any concerns you have, and receive a thorough all-over exam.   Most health insurance companies pay for at least some preventative services.  Check with your health plan for specific coverages.  WHAT PREVENTATIVE SERVICES DO WOMEN NEED?  Adult women should have their weight and blood pressure checked regularly.   Women age 33 and older should have their cholesterol levels checked regularly.  Women should be screened for cervical cancer with a Pap smear and pelvic exam beginning at either age 31, or 3 years after they become sexually activity.    Breast cancer screening generally begins at age 17 with a mammogram and breast exam by your primary care provider.    Beginning at age 47 and continuing to age 34, women should be screened for colorectal cancer.  Certain people may need continued testing until age 67.  Updating vaccinations is part of preventative care.  Vaccinations help protect against diseases such as the flu.  Osteoporosis is a disease in which the bones lose minerals and strength as we age. Women ages 61 and over should discuss this with their caregivers,  as should women after menopause who have other risk factors.  Lab tests are generally done as part of preventative care to screen for anemia and blood disorders, to screen for problems with the kidneys and liver, to screen for bladder problems, to check blood sugar, and to check your cholesterol level.  Preventative services generally include counseling about diet, exercise, avoiding tobacco, drugs, excessive alcohol consumption, and sexually transmitted infections.    GENERAL RECOMMENDATIONS FOR GOOD HEALTH:  Healthy diet:  Eat a variety of foods, including fruit, vegetables, animal or vegetable protein, such as meat, fish, chicken, and eggs, or beans, lentils, tofu, and grains, such as rice.  Drink plenty of water daily.  Decrease saturated fat in the diet, avoid lots of red meat, processed foods, sweets, fast foods, and fried foods.  Exercise:  Aerobic exercise helps maintain good heart health. At least 30-40 minutes of moderate-intensity exercise is recommended. For example, a brisk walk that increases your heart rate and breathing. This should be done on most days of the week.   Find a type of exercise or a variety of exercises that you enjoy so that it becomes a part of your daily life.  Examples are running, walking, swimming, water aerobics, and biking.  For motivation and support, explore group exercise such as aerobic class, spin class, Zumba, Yoga,or  martial arts, etc.    Set exercise goals for yourself, such as a certain weight goal, walk or run in  a race such as a 5k walk/run.  Speak to your primary care provider about exercise goals.  Disease prevention:  If you smoke or chew tobacco, find out from your caregiver how to quit. It can literally save your life, no matter how long you have been a tobacco user. If you do not use tobacco, never begin.   Maintain a healthy diet and normal weight. Increased weight leads to problems with blood pressure and diabetes.   The Body  Mass Index or BMI is a way of measuring how much of your body is fat. Having a BMI above 27 increases the risk of heart disease, diabetes, hypertension, stroke and other problems related to obesity. Your caregiver can help determine your BMI and based on it develop an exercise and dietary program to help you achieve or maintain this important measurement at a healthful level.  High blood pressure causes heart and blood vessel problems.  Persistent high blood pressure should be treated with medicine if weight loss and exercise do not work.   Fat and cholesterol leaves deposits in your arteries that can block them. This causes heart disease and vessel disease elsewhere in your body.  If your cholesterol is found to be high, or if you have heart disease or certain other medical conditions, then you may need to have your cholesterol monitored frequently and be treated with medication.   Ask if you should have a cardiac stress test if your history suggests this. A stress test is a test done on a treadmill that looks for heart disease. This test can find disease prior to there being a problem.  Menopause can be associated with physical symptoms and risks. Hormone replacement therapy is available to decrease these. You should talk to your caregiver about whether starting or continuing to take hormones is right for you.   Osteoporosis is a disease in which the bones lose minerals and strength as we age. This can result in serious bone fractures. Risk of osteoporosis can be identified using a bone density scan. Women ages 53 and over should discuss this with their caregivers, as should women after menopause who have other risk factors. Ask your caregiver whether you should be taking a calcium supplement and Vitamin D, to reduce the rate of osteoporosis.   Avoid drinking alcohol in excess (more than two drinks per day).  Avoid use of street drugs. Do not share needles with anyone. Ask for professional help if you  need assistance or instructions on stopping the use of alcohol, cigarettes, and/or drugs.  Brush your teeth twice a day with fluoride toothpaste, and floss once a day. Good oral hygiene prevents tooth decay and gum disease. The problems can be painful, unattractive, and can cause other health problems. Visit your dentist for a routine oral and dental check up and preventive care every 6-12 months.   Look at your skin regularly.  Use a mirror to look at your back. Notify your caregivers of changes in moles, especially if there are changes in shapes, colors, a size larger than a pencil eraser, an irregular border, or development of new moles.  Safety:  Use seatbelts 100% of the time, whether driving or as a passenger.  Use safety devices such as hearing protection if you work in environments with loud noise or significant background noise.  Use safety glasses when doing any work that could send debris in to the eyes.  Use a helmet if you ride a bike or motorcycle.  Use appropriate  safety gear for contact sports.  Talk to your caregiver about gun safety.  Use sunscreen with a SPF (or skin protection factor) of 15 or greater.  Lighter skinned people are at a greater risk of skin cancer. Don't forget to also wear sunglasses in order to protect your eyes from too much damaging sunlight. Damaging sunlight can accelerate cataract formation.   Practice safe sex. Use condoms. Condoms are used for birth control and to help reduce the spread of sexually transmitted infections (or STIs).  Some of the STIs are gonorrhea (the clap), chlamydia, syphilis, trichomonas, herpes, HPV (human papilloma virus) and HIV (human immunodeficiency virus) which causes AIDS. The herpes, HIV and HPV are viral illnesses that have no cure. These can result in disability, cancer and death.   Keep carbon monoxide and smoke detectors in your home functioning at all times. Change the batteries every 6 months or use a model that plugs into  the wall.   Vaccinations:  Stay up to date with your tetanus shots and other required immunizations. You should have a booster for tetanus every 10 years. Be sure to get your flu shot every year, since 5%-20% of the [place] population comes down with the flu. The flu vaccine changes each year, so being vaccinated once is not enough. Get your shot in the fall, before the flu season peaks.   Other vaccines to consider:  Human Papilloma Virus or HPV causes cancer of the cervix, and other infections that can be transmitted from person to person. There is a vaccine for HPV, and females should get immunized between the ages of 6 and 31. It requires a series of 3 shots.   Pneumococcal vaccine to protect against certain types of pneumonia.  This is normally recommended for adults age 51 or older.  However, adults younger than 54 years old with certain underlying conditions such as diabetes, heart or lung disease should also receive the vaccine.  Shingles vaccine to protect against Varicella Zoster if you are older than age 96, or younger than 54 years old with certain underlying illness.  Hepatitis A vaccine to protect against a form of infection of the liver by a virus acquired from food.  Hepatitis B vaccine to protect against a form of infection of the liver by a virus acquired from blood or body fluids, particularly if you work in health care.  If you plan to travel internationally, check with your local health department for specific vaccination recommendations.  Cancer Screening:  Breast cancer screening is essential to preventive care for women. All women age 75 and older should perform a breast self-exam every month. At age 82 and older, women should have their caregiver complete a breast exam each year. Women at ages 16 and older should have a mammogram (x-ray film) of the breasts. Your caregiver can discuss how often you need mammograms.    Cervical cancer screening includes taking a Pap smear  (sample of cells examined under a microscope) from the cervix (end of the uterus). It also includes testing for HPV (Human Papilloma Virus, which can cause cervical cancer). Screening and a pelvic exam should begin at age 73, or 3 years after a woman becomes sexually active. Screening should occur every year, with a Pap smear but no HPV testing, up to age 24. After age 66, you should have a Pap smear every 3 years with HPV testing, if no HPV was found previously.   Most routine colon cancer screening begins at the age of  50. On a yearly basis, doctors may provide special easy to use take-home tests to check for hidden blood in the stool. Sigmoidoscopy or colonoscopy can detect the earliest forms of colon cancer and is life saving. These tests use a small camera at the end of a tube to directly examine the colon. Speak to your caregiver about this at age 81, when routine screening begins (and is repeated every 5 years unless early forms of pre-cancerous polyps or small growths are found).

## 2014-04-29 NOTE — Progress Notes (Signed)
Complete Physical  Assessment and Plan: 1. Dense breast tissue Needs 3D MGM  2. Basal cell carcinoma Continue follow up with Dr. Denna Haggard  3. Routine general medical examination at a health care facility - CBC with Differential - BASIC METABOLIC PANEL WITH GFR - Hepatic function panel - Lipid panel - TSH - Hemoglobin A1c - Insulin, fasting - Vit D  25 hydroxy (rtn osteoporosis monitoring) - Urinalysis, Routine w reflex microscopic - Microalbumin / creatinine urine ratio - Vitamin B12 - Magnesium - Iron and TIBC - Ferritin  4. Anxiety Try wellbutrin, given xanax 0.5mg  #30 NR, stress management techniques discussed, increase water, good sleep hygiene discussed, increase exercise, and increase veggies.    Discussed med's effects and SE's. Screening labs and tests as requested with regular follow-up as recommended.  HPI 53 y.o. female  presents for a complete physical.  Her blood pressure has been controlled at home, today their BP is BP: 100/62 mmHg She does not workout. She denies chest pain, shortness of breath, dizziness.  She is not on cholesterol medication and denies myalgias. Her cholesterol is at goal. The cholesterol last visit was:   Lab Results  Component Value Date   CHOL 216* 01/03/2008   HDL 46.7 01/03/2008   LDLCALC 111* 10/03/2006   LDLDIRECT 151.2 01/03/2008   TRIG 47 01/03/2008   CHOLHDL 4.6 CALC 01/03/2008   Last A1C in the office was: 5.2 Patient is on Vitamin D supplement.   Iron was 156, ferritin 38, B12 374.  She has increased anxiety, she feels stress at work and stress with her kids, her daughter is struggling with depression and her son is in 10th grade and is not doing his work. She has trouble winding down at night, she was on celexa which she thought was helpful but she felt like she had some memory issues.    Current Medications:  Current Outpatient Prescriptions on File Prior to Visit  Medication Sig Dispense Refill  . CALCIUM PO Take 500 mg  by mouth daily.       . Cholecalciferol (VITAMIN D3 MAXIMUM STRENGTH) 5000 UNITS capsule Take 5,000 Units by mouth daily.      . cycloSPORINE (RESTASIS) 0.05 % ophthalmic emulsion Place 1 drop into both eyes 2 (two) times daily.      . nabumetone (RELAFEN) 500 MG tablet Take 1 tablet (500 mg total) by mouth 2 (two) times daily as needed (for pain).  90 tablet  0  . Omega-3 Fatty Acids (FISH OIL) 1200 MG CAPS Take 1,200 mg by mouth daily.      Marland Kitchen PRESCRIPTION MEDICATION Place 1 drop into both eyes 4 (four) times daily. Albumin preservative free 10% opthalmic solution  Compounded at [facility]       No current facility-administered medications on file prior to visit.   Health Maintenance:   Immunization History  Administered Date(s) Administered  . Tdap 04/16/2012   Tetanus: 2013 Pneumovax:  Flu vaccine: declines Zostavax: Pap: 2014 normal due 3 years MGM: 2013 DUE suggest 3D DEXA: Colonoscopy:2011 EGD:  Allergies:  Allergies  Allergen Reactions  . Erythromycin Nausea And Vomiting   Medical History:  Past Medical History  Diagnosis Date  . Seasonal allergies   . Hemangioma     on liver   Surgical History:  Past Surgical History  Procedure Laterality Date  . Breast surgery      biopsy x 3  . Cesarean section      x 3   Family History:  Family History  Problem Relation Age of Onset  . Hypertension Mother    Social History:  History  Substance Use Topics  . Smoking status: Never Smoker   . Smokeless tobacco: Not on file  . Alcohol Use: 0.6 oz/week    1 Glasses of wine per week    Review of Systems: [X]  = complains of  [ ]  = denies  General: Fatigue [ ]  Fever [ ]  Chills [ ]  Weakness [ ]   Insomnia [ ] Weight change [ ]  Night sweats [ ]   Change in appetite [ ]  Eyes: Redness [ ]  Blurred vision [ ]  Diplopia [ ]  Discharge [ ]   ENT: Congestion [ ]  Sinus Pain [ ]  Post Nasal Drip [ ]  Sore Throat [ ]  Earache [ ]  hearing loss [ ]  Tinnitus [ ]  Snoring [ ]    Cardiac: Chest pain/pressure [ ]  SOB [ ]  Orthopnea [ ]   Palpitations [ ]   Paroxysmal nocturnal dyspnea[ ]  Claudication [ ]  Edema [ ]   Pulmonary: Cough [ ]  Wheezing[ ]   SOB [ ]   Pleurisy [ ]   GI: Nausea [ ]  Vomiting[ ]  Dysphagia[ ]  Heartburn[ ]  Abdominal pain [ ]  Constipation [ ] ; Diarrhea [ ]  BRBPR [ ]  Melena[ ]  Bloating [ ]  Hemorrhoids [ ]   GU: Hematuria[ ]  Dysuria [ ]  Nocturia[ ]  Urgency [ ]   Hesitancy [ ]  Discharge [ ]  Frequency [ ]   Breast:  Breast lumps [ ]   nipple discharge [ ]    Neuro: Headaches[ ]  Vertigo[ ]  Paresthesias[ ]  Spasm [ ]  Speech changes [ ]  Incoordination [ ]   Ortho: Arthritis [ ]  Joint pain [ ]  Muscle pain [ ]  Joint swelling [ ]  Back Pain [ ]  Skin:  Rash [ ]   Pruritis [ ]  Change in skin lesion [ ]   Psych: Depression[ ]  Anxiety[x ] Confusion [ ]  Memory loss [ ]   Heme/Lypmh: Bleeding [ ]  Bruising [ ]  Enlarged lymph nodes [ ]   Endocrine: Visual blurring [ ]  Paresthesia [ ]  Polyuria [ ]  Polydypsea [ ]    Heat/cold intolerance [ ]  Hypoglycemia [ ]   Physical Exam: Estimated body mass index is 21.39 kg/(m^2) as calculated from the following:   Height as of this encounter: 5\' 2"  (1.575 m).   Weight as of this encounter: 117 lb (53.071 kg). BP 100/62  Pulse 72  Temp(Src) 97.9 F (36.6 C)  Resp 16  Ht 5\' 2"  (1.575 m)  Wt 117 lb (53.071 kg)  BMI 21.39 kg/m2 General Appearance: Well nourished, in no apparent distress. Eyes: PERRLA, EOMs, conjunctiva no swelling or erythema, normal fundi and vessels. Sinuses: No Frontal/maxillary tenderness ENT/Mouth: Ext aud canals clear, normal light reflex with TMs without erythema, bulging.  Good dentition. No erythema, swelling, or exudate on post pharynx. Tonsils not swollen or erythematous. Hearing normal.  Neck: Supple, thyroid normal. No bruits Respiratory: Respiratory effort normal, BS equal bilaterally without rales, rhonchi, wheezing or stridor. Cardio: RRR without murmurs, rubs or gallops. Brisk peripheral pulses without edema.   Chest: symmetric, with normal excursions and percussion. Breasts: Symmetric, without lumps, nipple discharge, retractions. Abdomen: Soft, +BS. Non tender, no guarding, rebound, hernias, masses, or organomegaly. .  Lymphatics: Non tender without lymphadenopathy.  Genitourinary: decline Musculoskeletal: Full ROM all peripheral extremities,5/5 strength, and normal gait. Skin: Warm, dry without rashes, lesions, ecchymosis.  Neuro: Cranial nerves intact, reflexes equal bilaterally. Normal muscle tone, no cerebellar symptoms. Sensation intact.  Psych: Awake and oriented X 3, normal affect, Insight and Judgment appropriate.   EKG: defer  Vicie Mutters 12:43 PM

## 2014-04-30 LAB — URINALYSIS, ROUTINE W REFLEX MICROSCOPIC
BILIRUBIN URINE: NEGATIVE
Glucose, UA: NEGATIVE mg/dL
HGB URINE DIPSTICK: NEGATIVE
KETONES UR: NEGATIVE mg/dL
Leukocytes, UA: NEGATIVE
Nitrite: NEGATIVE
PROTEIN: NEGATIVE mg/dL
Specific Gravity, Urine: 1.009 (ref 1.005–1.030)
UROBILINOGEN UA: 0.2 mg/dL (ref 0.0–1.0)
pH: 6 (ref 5.0–8.0)

## 2014-04-30 LAB — BASIC METABOLIC PANEL WITH GFR
BUN: 8 mg/dL (ref 6–23)
CHLORIDE: 105 meq/L (ref 96–112)
CO2: 27 mEq/L (ref 19–32)
CREATININE: 0.61 mg/dL (ref 0.50–1.10)
Calcium: 9.8 mg/dL (ref 8.4–10.5)
GFR, Est Non African American: >89 mL/min
GLUCOSE: 78 mg/dL (ref 70–99)
POTASSIUM: 4.5 meq/L (ref 3.5–5.3)
Sodium: 140 mEq/L (ref 135–145)

## 2014-04-30 LAB — TSH: TSH: 1.484 u[IU]/mL (ref 0.350–4.500)

## 2014-04-30 LAB — MICROALBUMIN / CREATININE URINE RATIO
Creatinine, Urine: 59.1 mg/dL
Microalb Creat Ratio: 3.4 mg/g (ref 0.0–30.0)
Microalb, Ur: 0.2 mg/dL (ref <2.0)

## 2014-04-30 LAB — LIPID PANEL
Cholesterol: 214 mg/dL — ABNORMAL HIGH (ref 0–200)
HDL: 69 mg/dL (ref >39)
LDL CALC: 128 mg/dL — AB (ref 0–99)
Total CHOL/HDL Ratio: 3.1 Ratio
Triglycerides: 83 mg/dL (ref <150)
VLDL: 17 mg/dL (ref 0–40)

## 2014-04-30 LAB — IRON AND TIBC
%SAT: 38 % (ref 20–55)
IRON: 122 ug/dL (ref 42–145)
TIBC: 325 ug/dL (ref 250–470)
UIBC: 203 ug/dL (ref 125–400)

## 2014-04-30 LAB — FERRITIN: Ferritin: 52 ng/mL (ref 10–291)

## 2014-04-30 LAB — HEPATIC FUNCTION PANEL
ALBUMIN: 4.3 g/dL (ref 3.5–5.2)
ALK PHOS: 81 U/L (ref 39–117)
ALT: 15 U/L (ref 0–35)
AST: 21 U/L (ref 0–37)
BILIRUBIN INDIRECT: 0.4 mg/dL (ref 0.2–1.2)
Bilirubin, Direct: 0.1 mg/dL (ref 0.0–0.3)
TOTAL PROTEIN: 7 g/dL (ref 6.0–8.3)
Total Bilirubin: 0.5 mg/dL (ref 0.2–1.2)

## 2014-04-30 LAB — INSULIN, FASTING: INSULIN FASTING, SERUM: 3.4 u[IU]/mL (ref 2.0–19.6)

## 2014-04-30 LAB — VITAMIN B12: Vitamin B-12: >2000 pg/mL — ABNORMAL HIGH (ref 211–911)

## 2014-04-30 LAB — MAGNESIUM: MAGNESIUM: 2 mg/dL (ref 1.5–2.5)

## 2014-04-30 LAB — VITAMIN D 25 HYDROXY (VIT D DEFICIENCY, FRACTURES): VIT D 25 HYDROXY: 80 ng/mL (ref 30–89)

## 2014-05-03 ENCOUNTER — Encounter: Payer: Self-pay | Admitting: Physician Assistant

## 2014-07-20 ENCOUNTER — Other Ambulatory Visit: Payer: Self-pay | Admitting: Physician Assistant

## 2014-07-20 ENCOUNTER — Other Ambulatory Visit: Payer: Self-pay | Admitting: Internal Medicine

## 2014-07-20 MED ORDER — NABUMETONE 500 MG PO TABS: 500.0000 mg | ORAL_TABLET | Freq: Two times a day (BID) | ORAL | Status: DC | PRN

## 2014-07-24 ENCOUNTER — Other Ambulatory Visit: Payer: Self-pay | Admitting: Physician Assistant

## 2014-07-24 MED ORDER — PROMETHAZINE HCL 25 MG PO TABS: 12.5000 mg | ORAL_TABLET | Freq: Four times a day (QID) | ORAL | Status: DC | PRN

## 2014-08-04 ENCOUNTER — Other Ambulatory Visit: Payer: Self-pay | Admitting: Physician Assistant

## 2014-08-05 ENCOUNTER — Other Ambulatory Visit: Payer: Self-pay

## 2015-04-30 ENCOUNTER — Other Ambulatory Visit: Payer: Self-pay

## 2015-04-30 ENCOUNTER — Ambulatory Visit (INDEPENDENT_AMBULATORY_CARE_PROVIDER_SITE_OTHER): Payer: BLUE CROSS/BLUE SHIELD | Admitting: Physician Assistant

## 2015-04-30 VITALS — BP 100/70 | HR 83 | Temp 97.5°F | Resp 16 | Ht 61.0 in | Wt 116.0 lb

## 2015-04-30 DIAGNOSIS — E785 Hyperlipidemia, unspecified: Secondary | ICD-10-CM

## 2015-04-30 DIAGNOSIS — K219 Gastro-esophageal reflux disease without esophagitis: Secondary | ICD-10-CM

## 2015-04-30 DIAGNOSIS — Z1231 Encounter for screening mammogram for malignant neoplasm of breast: Secondary | ICD-10-CM

## 2015-04-30 DIAGNOSIS — D649 Anemia, unspecified: Secondary | ICD-10-CM

## 2015-04-30 DIAGNOSIS — Z1389 Encounter for screening for other disorder: Secondary | ICD-10-CM

## 2015-04-30 DIAGNOSIS — E559 Vitamin D deficiency, unspecified: Secondary | ICD-10-CM

## 2015-04-30 DIAGNOSIS — R413 Other amnesia: Secondary | ICD-10-CM

## 2015-04-30 DIAGNOSIS — Z79899 Other long term (current) drug therapy: Secondary | ICD-10-CM

## 2015-04-30 DIAGNOSIS — I10 Essential (primary) hypertension: Secondary | ICD-10-CM

## 2015-04-30 DIAGNOSIS — J309 Allergic rhinitis, unspecified: Secondary | ICD-10-CM

## 2015-04-30 DIAGNOSIS — Z1159 Encounter for screening for other viral diseases: Secondary | ICD-10-CM

## 2015-04-30 DIAGNOSIS — Z0000 Encounter for general adult medical examination without abnormal findings: Secondary | ICD-10-CM

## 2015-04-30 DIAGNOSIS — Z131 Encounter for screening for diabetes mellitus: Secondary | ICD-10-CM

## 2015-04-30 DIAGNOSIS — R922 Inconclusive mammogram: Secondary | ICD-10-CM

## 2015-04-30 DIAGNOSIS — F411 Generalized anxiety disorder: Secondary | ICD-10-CM | POA: Insufficient documentation

## 2015-04-30 DIAGNOSIS — C4491 Basal cell carcinoma of skin, unspecified: Secondary | ICD-10-CM

## 2015-04-30 LAB — CBC WITH DIFFERENTIAL/PLATELET
BASOS PCT: 0 % (ref 0–1)
Basophils Absolute: 0 10*3/uL (ref 0.0–0.1)
EOS ABS: 0.2 10*3/uL (ref 0.0–0.7)
EOS PCT: 3 % (ref 0–5)
HCT: 40.4 % (ref 36.0–46.0)
Hemoglobin: 14.4 g/dL (ref 12.0–15.0)
LYMPHS ABS: 1.8 10*3/uL (ref 0.7–4.0)
Lymphocytes Relative: 27 % (ref 12–46)
MCH: 31.8 pg (ref 26.0–34.0)
MCHC: 35.6 g/dL (ref 30.0–36.0)
MCV: 89.2 fL (ref 78.0–100.0)
MONO ABS: 0.5 10*3/uL (ref 0.1–1.0)
MONOS PCT: 7 % (ref 3–12)
MPV: 10.5 fL (ref 8.6–12.4)
NEUTROS PCT: 63 % (ref 43–77)
Neutro Abs: 4.1 10*3/uL (ref 1.7–7.7)
PLATELETS: 239 10*3/uL (ref 150–400)
RBC: 4.53 MIL/uL (ref 3.87–5.11)
RDW: 13.5 % (ref 11.5–15.5)
WBC: 6.5 10*3/uL (ref 4.0–10.5)

## 2015-04-30 LAB — HEMOGLOBIN A1C
HEMOGLOBIN A1C: 5.3 % (ref <5.7)
MEAN PLASMA GLUCOSE: 105 mg/dL (ref <117)

## 2015-04-30 MED ORDER — PREDNISONE 20 MG PO TABS: ORAL_TABLET | ORAL | Status: DC

## 2015-04-30 MED ORDER — PROMETHAZINE HCL 25 MG PO TABS: 25.0000 mg | ORAL_TABLET | Freq: Four times a day (QID) | ORAL | Status: DC | PRN

## 2015-04-30 MED ORDER — CIPROFLOXACIN HCL 500 MG PO TABS: 500.0000 mg | ORAL_TABLET | Freq: Two times a day (BID) | ORAL | Status: DC

## 2015-04-30 MED ORDER — FLUCONAZOLE 150 MG PO TABS: 150.0000 mg | ORAL_TABLET | Freq: Every day | ORAL | Status: DC

## 2015-04-30 MED ORDER — NABUMETONE 500 MG PO TABS: 500.0000 mg | ORAL_TABLET | Freq: Two times a day (BID) | ORAL | Status: DC | PRN

## 2015-04-30 MED ORDER — AZITHROMYCIN 250 MG PO TABS: ORAL_TABLET | ORAL | Status: DC

## 2015-04-30 MED ORDER — HYOSCYAMINE SULFATE 0.125 MG SL SUBL: 0.1250 mg | SUBLINGUAL_TABLET | SUBLINGUAL | Status: DC | PRN

## 2015-04-30 NOTE — Patient Instructions (Addendum)
  If you are traveling you can take these medications to be more prepared. If you get chest pain, shortness of breath or abdominal pain please go to the hospital wherever you may be.   Ciprofloxacin is good for travelers diarrhea, you can take 2 pills a day for 7 days. Or it is also good for urinary tract infections, you can take 2 a day for 7 days.  Zpak- is good for sinus infections please finish as prescribed Phenergran is for nausea but it sedating so plan on eating and sleeping.  Levsin is good for nausea, diarrhea, or abdominal cramping- it can constipate you so don't take too much. This dissolves under your tongue.  Prednisone is good for joint pain or rashes or spider bites- you can take it as prescribed but if you are feeling better you can stop it early.  Diflucan is for a yeast infection.   

## 2015-04-30 NOTE — Progress Notes (Signed)
Complete Physical  Assessment and Plan: 1. Hyperlipidemia -continue medications, check lipids, decrease fatty foods, increase activity.  - CBC with Differential/Platelet - BASIC METABOLIC PANEL WITH GFR - Hepatic function panel - Lipid panel - TSH - EKG 12-Lead  2. Dense breast tissue Get 3D MGM  3. Basal cell carcinoma Follow up tafeen  4. Generalized anxiety disorder Stop wellbutrin, start brintellix  5. Gastroesophageal reflux disease, esophagitis presence not specified Continue PPI/H2 blocker, diet discussed  6. Allergic rhinitis, unspecified allergic rhinitis type Continue OTC allergy pills  7. Screening for blood or protein in urine - Urinalysis, Routine w reflex microscopic (not at [facility]) - Microalbumin / creatinine urine ratio  8. Routine general medical examination at a health care facility - hyoscyamine (LEVSIN/SL) 0.125 MG SL tablet; Place 1 tablet (0.125 mg total) under the tongue every 4 (four) hours as needed for cramping (nausea, diarrhea).  Dispense: 50 tablet; Refill: 1 - nabumetone (RELAFEN) 500 MG tablet; Take 1 tablet (500 mg total) by mouth 2 (two) times daily as needed (for pain).  Dispense: 90 tablet; Refill: 1 - CBC with Differential/Platelet - BASIC METABOLIC PANEL WITH GFR - Hepatic function panel - Lipid panel - TSH - Hemoglobin A1c - Insulin, fasting - Magnesium - Vit D  25 hydroxy (rtn osteoporosis monitoring) - Urinalysis, Routine w reflex microscopic (not at [facility]) - Microalbumin / creatinine urine ratio - Iron and TIBC - Ferritin - Vitamin B12 - EKG 12-Lead - RPR - Hepatitis C antibody - Sedimentation rate  9. 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  - RPR - Sedimentation rate  10. Medication management - Magnesium  11. Vitamin D deficiency - Vit D  25 hydroxy (rtn osteoporosis monitoring)  12. Screening for diabetes mellitus - Hemoglobin  A1c - Insulin, fasting  13. Anemia, unspecified anemia type - Iron and TIBC - Ferritin - Vitamin B12  14. Screening for viral disease - RPR - Hepatitis C antibody - HIV antibody (with reflex)    Discussed med's effects and SE's. Screening labs and tests as requested with regular follow-up as recommended.  HPI 54 y.o. female  presents for a complete physical.  Her blood pressure has been controlled at home, today their BP is BP: 100/70 mmHg She does not workout. She denies chest pain, shortness of breath, dizziness.  She is not on cholesterol medication and denies myalgias. Her cholesterol is at goal. The cholesterol last visit was:   Lab Results  Component Value Date   CHOL 214* 04/29/2014   HDL 69 04/29/2014   LDLCALC 128* 04/29/2014   LDLDIRECT 151.2 01/03/2008   TRIG 83 04/29/2014   CHOLHDL 3.1 04/29/2014   Last A1C in the office was:  Lab Results  Component Value Date   HGBA1C 5.3 04/29/2014   Patient is on Vitamin D supplement.  Lab Results  Component Value Date   VD25OH 80 04/29/2014   Lab Results  Component Value Date   VITAMINB12 >2000* 04/29/2014   Lab Results  Component Value Date   IRON 122 04/29/2014   TIBC 325 04/29/2014   FERRITIN 52 04/29/2014   She has increased anxiety, she feels stress at work and stress with her kids, she is very stressful at work, only sleeping 4 hours, her daughter had a severe accident this summer with cervical and lumbar fractures, she is back at school ( [employer]) but struggling with depression, her son is in 11th grade.  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 Still completing work, no HA, dizziness, changes in vision, weakness.    Current Medications:  Current Outpatient Prescriptions on File Prior to Visit  Medication Sig Dispense Refill  . ALPRAZolam  (XANAX) 0.5 MG tablet 1/2-1 pill as needed daily for anxiety 30 tablet 0  . buPROPion (WELLBUTRIN XL) 150 MG 24 hr tablet TAKE 1 TABLET (150 MG TOTAL) BY MOUTH EVERY MORNING. 90 tablet 0  . CALCIUM PO Take 500 mg by mouth daily.     . Cholecalciferol (VITAMIN D3 MAXIMUM STRENGTH) 5000 UNITS capsule Take 5,000 Units by mouth daily.    . Cyanocobalamin (VITAMIN B 12 PO) Take by mouth daily.    . cycloSPORINE (RESTASIS) 0.05 % ophthalmic emulsion Place 1 drop into both eyes 2 (two) times daily.    . hyoscyamine (LEVSIN/SL) 0.125 MG SL tablet Place 1 tablet (0.125 mg total) under the tongue every 4 (four) hours as needed for cramping (nausea, diarrhea). 50 tablet 1  . nabumetone (RELAFEN) 500 MG tablet Take 1 tablet (500 mg total) by mouth 2 (two) times daily as needed (for pain). 90 tablet 1  . Omega-3 Fatty Acids (FISH OIL) 1200 MG CAPS Take 1,200 mg by mouth daily.    Marland Kitchen OVER THE COUNTER MEDICATION Turmeric daily     No current facility-administered medications on file prior to visit.   Health Maintenance:   Immunization History  Administered Date(s) Administered  . Tdap 04/16/2012   Tetanus: 2013 Pneumovax: N/A Prevnar 13: due age 78 Flu vaccine: declines Zostavax: N/A Pap: 2014 normal due 3 years/2017 MGM: 2013 DUE suggest 3D DEXA: Colonoscopy:2011 EGD:  Allergies:  Allergies  Allergen Reactions  . Erythromycin Nausea And Vomiting   Medical History:  Past Medical History  Diagnosis Date  . Seasonal allergies   . Hemangioma     on liver   Surgical History:  Past Surgical History  Procedure Laterality Date  . Breast surgery      biopsy x 3  . Cesarean section      x 3   Family History:  Family History  Problem Relation Age of Onset  . Hypertension Mother    Social History:  Social History  Substance Use Topics  . Smoking status: Never Smoker   . Smokeless tobacco: Not on file  . Alcohol Use: 0.6 oz/week    1 Glasses of wine per week   Review of Systems   Constitutional: Negative.   HENT: Negative.   Eyes: Negative.   Respiratory: Negative.   Cardiovascular: Negative.   Gastrointestinal: Negative.   Genitourinary: Negative.   Musculoskeletal: Negative.   Skin: Negative.   Neurological: Negative.  Negative for dizziness, tingling, tremors, sensory change, speech change, focal weakness, seizures and loss of consciousness.  Endo/Heme/Allergies: Negative.   Psychiatric/Behavioral: Positive for depression and memory loss. Negative for suicidal ideas, hallucinations and substance abuse. The patient is nervous/anxious and has insomnia.      Physical Exam: Estimated body mass index is 21.93 kg/(m^2) as calculated from the following:   Height as of this encounter: 5\' 1"  (1.549 m).   Weight as of this encounter: 116 lb (52.617 kg). BP 100/70 mmHg  Pulse 83  Temp(Src) 97.5 F (36.4 C) (Temporal)  Resp 16  Ht 5\' 1"  (1.549 m)  Wt 116 lb (52.617  kg)  BMI 21.93 kg/m2 General Appearance: Well nourished, in no apparent distress. Eyes: PERRLA, EOMs, conjunctiva no swelling or erythema, normal fundi and vessels. Sinuses: No Frontal/maxillary tenderness ENT/Mouth: Ext aud canals clear, normal light reflex with TMs without erythema, bulging.  Good dentition. No erythema, swelling, or exudate on post pharynx. Tonsils not swollen or erythematous. Hearing normal.  Neck: Supple, thyroid normal. No bruits Respiratory: Respiratory effort normal, BS equal bilaterally without rales, rhonchi, wheezing or stridor. Cardio: RRR without murmurs, rubs or gallops. Brisk peripheral pulses without edema.  Chest: symmetric, with normal excursions and percussion. Breasts: Symmetric, without lumps, nipple discharge, retractions. Abdomen: Soft, +BS. Non tender, no guarding, rebound, hernias, masses, or organomegaly. .  Lymphatics: Non tender without lymphadenopathy.  Genitourinary: decline Musculoskeletal: Full ROM all peripheral extremities,5/5 strength, and normal  gait. Skin: Warm, dry without rashes, lesions, ecchymosis.  Neuro: Cranial nerves intact, reflexes equal bilaterally. Normal muscle tone, no cerebellar symptoms. Sensation intact.  Psych: Awake and oriented X 3, normal affect, Insight and Judgment appropriate.   EKG: defer   Vicie Mutters 2:32 PM

## 2015-05-01 LAB — BASIC METABOLIC PANEL WITH GFR
BUN: 14 mg/dL (ref 7–25)
CALCIUM: 9.4 mg/dL (ref 8.6–10.4)
CHLORIDE: 102 mmol/L (ref 98–110)
CO2: 28 mmol/L (ref 20–31)
CREATININE: 0.67 mg/dL (ref 0.50–1.05)
GFR, Est African American: >89 mL/min (ref >=60)
GFR, Est Non African American: >89 mL/min (ref >=60)
Glucose, Bld: 81 mg/dL (ref 65–99)
Potassium: 4.5 mmol/L (ref 3.5–5.3)
SODIUM: 140 mmol/L (ref 135–146)

## 2015-05-01 LAB — IRON AND TIBC
%SAT: 32 % (ref 11–50)
Iron: 97 ug/dL (ref 45–160)
TIBC: 301 ug/dL (ref 250–450)
UIBC: 204 ug/dL (ref 125–400)

## 2015-05-01 LAB — LIPID PANEL
Cholesterol: 221 mg/dL — ABNORMAL HIGH (ref 125–200)
HDL: 58 mg/dL (ref >=46)
LDL Cholesterol: 146 mg/dL — ABNORMAL HIGH (ref <130)
Total CHOL/HDL Ratio: 3.8 Ratio (ref <=5.0)
Triglycerides: 86 mg/dL (ref <150)
VLDL: 17 mg/dL (ref <30)

## 2015-05-01 LAB — TSH: TSH: 1.555 u[IU]/mL (ref 0.350–4.500)

## 2015-05-01 LAB — SEDIMENTATION RATE: Sed Rate: 4 mm/hr (ref 0–30)

## 2015-05-01 LAB — URINALYSIS, ROUTINE W REFLEX MICROSCOPIC
BILIRUBIN URINE: NEGATIVE
GLUCOSE, UA: NEGATIVE
HGB URINE DIPSTICK: NEGATIVE
KETONES UR: NEGATIVE
Leukocytes, UA: NEGATIVE
NITRITE: NEGATIVE
PH: 6 (ref 5.0–8.0)
Protein, ur: NEGATIVE
SPECIFIC GRAVITY, URINE: 1.015 (ref 1.001–1.035)

## 2015-05-01 LAB — HEPATIC FUNCTION PANEL
ALBUMIN: 4.4 g/dL (ref 3.6–5.1)
ALT: 14 U/L (ref 6–29)
AST: 19 U/L (ref 10–35)
Alkaline Phosphatase: 94 U/L (ref 33–130)
BILIRUBIN TOTAL: 0.4 mg/dL (ref 0.2–1.2)
Bilirubin, Direct: 0.1 mg/dL (ref <=0.2)
Indirect Bilirubin: 0.3 mg/dL (ref 0.2–1.2)
Total Protein: 6.9 g/dL (ref 6.1–8.1)

## 2015-05-01 LAB — RPR

## 2015-05-01 LAB — MICROALBUMIN / CREATININE URINE RATIO
Creatinine, Urine: 103.4 mg/dL
Microalb Creat Ratio: 8.7 mg/g (ref 0.0–30.0)
Microalb, Ur: 0.9 mg/dL (ref <2.0)

## 2015-05-01 LAB — HEPATITIS C ANTIBODY: HCV Ab: NEGATIVE

## 2015-05-01 LAB — VITAMIN B12: Vitamin B-12: 1613 pg/mL — ABNORMAL HIGH (ref 211–911)

## 2015-05-01 LAB — FERRITIN: FERRITIN: 105 ng/mL (ref 10–291)

## 2015-05-01 LAB — INSULIN, FASTING: INSULIN FASTING, SERUM: 2.7 u[IU]/mL (ref 2.0–19.6)

## 2015-05-01 LAB — MAGNESIUM: MAGNESIUM: 2 mg/dL (ref 1.5–2.5)

## 2015-05-01 LAB — VITAMIN D 25 HYDROXY (VIT D DEFICIENCY, FRACTURES): Vit D, 25-Hydroxy: 58 ng/mL (ref 30–100)

## 2015-05-06 ENCOUNTER — Other Ambulatory Visit: Payer: Self-pay

## 2015-05-06 ENCOUNTER — Ambulatory Visit
Admission: RE | Admit: 2015-05-06 | Discharge: 2015-05-06 | Disposition: A | Discharge status: Home / Self Care | Payer: BLUE CROSS/BLUE SHIELD | Source: Ambulatory Visit

## 2015-05-06 DIAGNOSIS — Z1231 Encounter for screening mammogram for malignant neoplasm of breast: Secondary | ICD-10-CM

## 2015-05-07 ENCOUNTER — Encounter: Payer: Self-pay | Admitting: Physician Assistant

## 2015-05-07 ENCOUNTER — Other Ambulatory Visit: Payer: Self-pay | Admitting: Physician Assistant

## 2015-06-03 ENCOUNTER — Encounter: Payer: Self-pay | Admitting: Physician Assistant

## 2015-06-03 ENCOUNTER — Ambulatory Visit (INDEPENDENT_AMBULATORY_CARE_PROVIDER_SITE_OTHER): Payer: BLUE CROSS/BLUE SHIELD | Admitting: Physician Assistant

## 2015-06-03 VITALS — BP 128/70 | HR 75 | Temp 97.3°F | Resp 16 | Ht 61.0 in | Wt 120.0 lb

## 2015-06-03 DIAGNOSIS — F411 Generalized anxiety disorder: Secondary | ICD-10-CM

## 2015-06-03 DIAGNOSIS — R413 Other amnesia: Secondary | ICD-10-CM | POA: Diagnosis not present

## 2015-06-03 MED ORDER — AMPHETAMINE-DEXTROAMPHETAMINE 10 MG PO TABS: 10.0000 mg | ORAL_TABLET | Freq: Two times a day (BID) | ORAL | Status: DC

## 2015-06-03 MED ORDER — ALPRAZOLAM 0.5 MG PO TABS: ORAL_TABLET | ORAL | Status: DC

## 2015-06-03 MED ORDER — PHOSPHATIDYLSERINE-DHA-EPA 100-19.5-6.5 MG PO CAPS: ORAL_CAPSULE | ORAL | Status: DC

## 2015-06-03 MED ORDER — BUPROPION HCL ER (XL) 150 MG PO TB24: ORAL_TABLET | ORAL | Status: DC

## 2015-06-03 NOTE — Patient Instructions (Signed)
Generalized Anxiety Disorder Generalized anxiety disorder (GAD) is a mental disorder. It interferes with life functions, including relationships, work, and school. GAD is different from normal anxiety, which everyone experiences at some point in their lives in response to specific life events and activities. Normal anxiety actually helps [place] prepare for and get through these life events and activities. Normal anxiety goes away after the event or activity is over.  GAD causes anxiety that is not necessarily related to specific events or activities. It also causes excess anxiety in proportion to specific events or activities. The anxiety associated with GAD is also difficult to control. GAD can vary from mild to severe. People with severe GAD can have intense waves of anxiety with physical symptoms (panic attacks).  SYMPTOMS The anxiety and worry associated with GAD are difficult to control. This anxiety and worry are related to many life events and activities and also occur more days than not for 6 months or longer. People with GAD also have three or more of the following symptoms (one or more in children):  Restlessness.   Fatigue.  Difficulty concentrating.   Irritability.  Muscle tension.  Difficulty sleeping or unsatisfying sleep. DIAGNOSIS GAD is diagnosed through an assessment by your health care provider. Your health care provider will ask you questions aboutyour mood,physical symptoms, and events in your life. Your health care provider may ask you about your medical history and use of alcohol or drugs, including prescription medicines. Your health care provider may also do a physical exam and blood tests. Certain medical conditions and the use of certain substances can cause symptoms similar to those associated with GAD. Your health care provider may refer you to a mental health specialist for further evaluation. TREATMENT The following therapies are usually used to treat GAD:    Medication. Antidepressant medication usually is prescribed for long-term daily control. Antianxiety medicines may be added in severe cases, especially when panic attacks occur.   Talk therapy (psychotherapy). Certain types of talk therapy can be helpful in treating GAD by providing support, education, and guidance. A form of talk therapy called cognitive behavioral therapy can teach you healthy ways to think about and react to daily life events and activities.  Stress managementtechniques. These include yoga, meditation, and exercise and can be very helpful when they are practiced regularly. A mental health specialist can help determine which treatment is best for you. Some people see improvement with one therapy. However, other people require a combination of therapies.   This information is not intended to replace advice given to you by your health care provider. Make sure you discuss any questions you have with your health care provider.   Document Released: 11/19/2012 Document Revised: 08/15/2014 Document Reviewed: 11/19/2012 Elsevier Interactive Patient Education 2016 [place] [employer]   Use "WARM" strategy.  W= write it down  A= associate it  R= repeat it  M= make a mental note  2.   You can keep a Social worker.  Use a 3-ring notebook with sections for the following: calendar, important names and phone numbers,  medications, doctors' names/phone numbers, lists/reminders, and a section to journal what you did  each day.   3.    Use a calendar to write appointments down.  4.    Write yourself a schedule for the day.  This can be placed on the calendar or in a separate section of the Memory Notebook.  Keeping a  regular schedule can help memory.  5.  Use medication organizer with sections for each day or morning/evening pills.  You may need help loading it  6.    Keep a basket, or pegboard by the door.  Place items that you need to take  out with you in the basket or on the pegboard.  You may also want to  include a message board for reminders.  7.    Use sticky notes.  Place sticky notes with reminders in a place where the task is performed.  For example: " turn off the  stove" placed by the stove, "lock the door" placed on the door at eye level, " take your medications" on  the bathroom mirror or by the place where you normally take your medications.  8.    Use alarms/timers.  Use while cooking to remind yourself to check on food or as a reminder to take your medicine, or as a  reminder to make a call, or as a reminder to perform another task, etc.

## 2015-06-03 NOTE — Progress Notes (Signed)
Assessment and Plan: Anxiety/memory impairment- normal neuro- continue wellbutrin- will add on vayacog and do low dose adderall. Suggest counseling. Follow up 1-2 months   HPI 54 y.o.female presents for follow up for medication change after CPE. She was having anxiety/depression due to increase stress with children/job and her wellbutrin was switched to brintellix but she got nausea with it so she was unable to use it.  So she started back on 150mg  1/2 pill daily.  All of her labs were normal but her cholesterol was elevated.   She went on vacation for a week and states she felt better after that. No issues setting up the trip, organizing, driving down to [place]. She mainly complains of trouble with auditory processing, and word finding issues. Normal CT head 05/2012.     Past Medical History  Diagnosis Date  . Seasonal allergies   . Hemangioma     on liver     Allergies  Allergen Reactions  . Erythromycin Nausea And Vomiting      Current Outpatient Prescriptions on File Prior to Visit  Medication Sig Dispense Refill  . ALPRAZolam (XANAX) 0.5 MG tablet 1/2-1 pill as needed daily for anxiety 30 tablet 0  . buPROPion (WELLBUTRIN XL) 150 MG 24 hr tablet TAKE 1 TABLET (150 MG TOTAL) BY MOUTH EVERY MORNING. 90 tablet 0  . Cholecalciferol (VITAMIN D3 MAXIMUM STRENGTH) 5000 UNITS capsule Take 5,000 Units by mouth daily.    . Cyanocobalamin (VITAMIN B 12 PO) Take by mouth daily.    . cycloSPORINE (RESTASIS) 0.05 % ophthalmic emulsion Place 1 drop into both eyes 2 (two) times daily.    . nabumetone (RELAFEN) 500 MG tablet Take 1 tablet (500 mg total) by mouth 2 (two) times daily as needed (for pain). 90 tablet 1  . Omega-3 Fatty Acids (FISH OIL) 1200 MG CAPS Take 1,200 mg by mouth daily.    Marland Kitchen OVER THE COUNTER MEDICATION Turmeric daily     No current facility-administered medications on file prior to visit.    ROS: all negative except above.   Physical Exam: Filed Weights   06/03/15  1150  Weight: 120 lb (54.432 kg)   BP 128/70 mmHg  Pulse 75  Temp(Src) 97.3 F (36.3 C) (Temporal)  Resp 16  Ht 5\' 1"  (1.549 m)  Wt 120 lb (54.432 kg)  BMI 22.69 kg/m2  SpO2 95% General Appearance: Well nourished, in no apparent distress. Eyes: PERRLA, EOMs, conjunctiva no swelling or erythema Sinuses: No Frontal/maxillary tenderness ENT/Mouth: Ext aud canals clear, TMs without erythema, bulging. No erythema, swelling, or exudate on post pharynx.  Tonsils not swollen or erythematous. Hearing normal.  Neck: Supple, thyroid normal.  Respiratory: Respiratory effort normal, BS equal bilaterally without rales, rhonchi, wheezing or stridor.  Cardio: RRR with no MRGs. Brisk peripheral pulses without edema.  Abdomen: Soft, + BS.  Non tender, no guarding, rebound, hernias, masses. Lymphatics: Non tender without lymphadenopathy.  Musculoskeletal: Full ROM, 5/5 strength, normal gait.  Skin: Warm, dry without rashes, lesions, ecchymosis.  Neuro: Cranial nerves intact. Normal muscle tone, no cerebellar symptoms. Sensation intact.  Psych: Awake and oriented X 3, normal affect, Insight and Judgment appropriate.     Vicie Mutters, PA-C 11:57 AM 2020 [facility]

## 2015-06-10 ENCOUNTER — Other Ambulatory Visit: Payer: Self-pay | Admitting: Physician Assistant

## 2015-06-10 MED ORDER — AMPHETAMINE-DEXTROAMPHETAMINE 10 MG PO TABS: 10.0000 mg | ORAL_TABLET | Freq: Two times a day (BID) | ORAL | Status: DC

## 2015-06-11 ENCOUNTER — Other Ambulatory Visit: Payer: Self-pay | Admitting: Physician Assistant

## 2015-07-13 ENCOUNTER — Encounter (INDEPENDENT_AMBULATORY_CARE_PROVIDER_SITE_OTHER): Payer: BLUE CROSS/BLUE SHIELD | Admitting: Ophthalmology

## 2015-07-13 DIAGNOSIS — H4311 Vitreous hemorrhage, right eye: Secondary | ICD-10-CM | POA: Diagnosis not present

## 2015-07-13 DIAGNOSIS — H33301 Unspecified retinal break, right eye: Secondary | ICD-10-CM

## 2015-07-13 DIAGNOSIS — H2513 Age-related nuclear cataract, bilateral: Secondary | ICD-10-CM

## 2015-07-13 DIAGNOSIS — H4313 Vitreous hemorrhage, bilateral: Secondary | ICD-10-CM | POA: Diagnosis not present

## 2015-07-27 ENCOUNTER — Ambulatory Visit (INDEPENDENT_AMBULATORY_CARE_PROVIDER_SITE_OTHER): Payer: BLUE CROSS/BLUE SHIELD | Admitting: Ophthalmology

## 2015-07-27 DIAGNOSIS — H33301 Unspecified retinal break, right eye: Secondary | ICD-10-CM

## 2015-08-05 ENCOUNTER — Ambulatory Visit (INDEPENDENT_AMBULATORY_CARE_PROVIDER_SITE_OTHER): Payer: BLUE CROSS/BLUE SHIELD | Admitting: Physician Assistant

## 2015-08-05 ENCOUNTER — Encounter: Payer: Self-pay | Admitting: Physician Assistant

## 2015-08-05 ENCOUNTER — Other Ambulatory Visit: Payer: Self-pay

## 2015-08-05 VITALS — BP 116/80 | HR 84 | Temp 97.5°F | Resp 16 | Ht 61.0 in | Wt 122.0 lb

## 2015-08-05 DIAGNOSIS — Z79899 Other long term (current) drug therapy: Secondary | ICD-10-CM

## 2015-08-05 DIAGNOSIS — E559 Vitamin D deficiency, unspecified: Secondary | ICD-10-CM | POA: Diagnosis not present

## 2015-08-05 DIAGNOSIS — F411 Generalized anxiety disorder: Secondary | ICD-10-CM | POA: Diagnosis not present

## 2015-08-05 DIAGNOSIS — E785 Hyperlipidemia, unspecified: Secondary | ICD-10-CM

## 2015-08-05 DIAGNOSIS — R413 Other amnesia: Secondary | ICD-10-CM

## 2015-08-05 MED ORDER — AMPHETAMINE-DEXTROAMPHETAMINE 10 MG PO TABS: 10.0000 mg | ORAL_TABLET | Freq: Two times a day (BID) | ORAL | Status: DC

## 2015-08-05 MED ORDER — ALPRAZOLAM 0.5 MG PO TABS: ORAL_TABLET | ORAL | Status: DC

## 2015-08-05 NOTE — Patient Instructions (Addendum)
Go to [email or web address]  Generalized Anxiety Disorder Generalized anxiety disorder (GAD) is a mental disorder. It interferes with life functions, including relationships, work, and school. GAD is different from normal anxiety, which everyone experiences at some point in their lives in response to specific life events and activities. Normal anxiety actually helps [place] prepare for and get through these life events and activities. Normal anxiety goes away after the event or activity is over.  GAD causes anxiety that is not necessarily related to specific events or activities. It also causes excess anxiety in proportion to specific events or activities. The anxiety associated with GAD is also difficult to control. GAD can vary from mild to severe. People with severe GAD can have intense waves of anxiety with physical symptoms (panic attacks).  SYMPTOMS The anxiety and worry associated with GAD are difficult to control. This anxiety and worry are related to many life events and activities and also occur more days than not for 6 months or longer. People with GAD also have three or more of the following symptoms (one or more in children):  Restlessness.   Fatigue.  Difficulty concentrating.   Irritability.  Muscle tension.  Difficulty sleeping or unsatisfying sleep. DIAGNOSIS GAD is diagnosed through an assessment by your health care provider. Your health care provider will ask you questions aboutyour mood,physical symptoms, and events in your life. Your health care provider may ask you about your medical history and use of alcohol or drugs, including prescription medicines. Your health care provider may also do a physical exam and blood tests. Certain medical conditions and the use of certain substances can cause symptoms similar to those associated with GAD. Your health care provider may refer you to a mental health specialist for further evaluation. TREATMENT The following therapies are usually  used to treat GAD:   Medication. Antidepressant medication usually is prescribed for long-term daily control. Antianxiety medicines may be added in severe cases, especially when panic attacks occur.   Talk therapy (psychotherapy). Certain types of talk therapy can be helpful in treating GAD by providing support, education, and guidance. A form of talk therapy called cognitive behavioral therapy can teach you healthy ways to think about and react to daily life events and activities.  Stress managementtechniques. These include yoga, meditation, and exercise and can be very helpful when they are practiced regularly. A mental health specialist can help determine which treatment is best for you. Some people see improvement with one therapy. However, other people require a combination of therapies.   This information is not intended to replace advice given to you by your health care provider. Make sure you discuss any questions you have with your health care provider.   Document Released: 11/19/2012 Document Revised: 08/15/2014 Document Reviewed: 11/19/2012 Elsevier Interactive Patient Education [employer].

## 2015-08-05 NOTE — Progress Notes (Signed)
Assessment and Plan:  1. Hypertension -Continue medication, monitor blood pressure at home. Continue DASH diet.  Reminder to go to the ER if any CP, SOB, nausea, dizziness, severe HA, changes vision/speech, left arm numbness and tingling and jaw pain.  2. Cholesterol -Continue diet and exercise. Check cholesterol next OV  3. Memory issues Continue adderall PRN, and will add on vayacog, suggest counseling, normal neuro  4. Vitamin D Def - check level and continue medications.   5. Anxiety Continue wellbutrin, suggest counseling.   Continue diet and meds as discussed. Further disposition pending results of labs. Over 30 minutes of exam, counseling, chart review, and critical decision making was performed Future Appointments Date Time Provider [place]  11/30/2015 2:30 PM [name], MD [facility] None  05/02/2016 2:00 PM Vicie Mutters, PA-C [facility] None    HPI 54 y.o. female  presents for 3 month follow up on hypertension, cholesterol, stress/memory issues, and vitamin D deficiency.   Her blood pressure has been controlled at home, today their BP is BP: 116/80 mmHg  She does not workout. She denies chest pain, shortness of breath, dizziness. She has been having increased stress with her daughter and work and having trouble concentrating/word finding issues, normal neuro exams. She was put on wellbutrin, adderall and vayacog last visit but never got an email and is here for follow up, she states that she is feeling better. Her kids are home from college which has increased her stress but she is not forgetting things, feels she is handling work better. .  She had right eye blackness after a trip to [place] for work, she saw her eye doctor and had retinal tear without detachment, had surgery with Dr. Zigmund Daniel, has follow up appointment in April. Eye is better now, no pain, issues.   She is not on cholesterol medication and denies myalgias. Her cholesterol is not at goal. The  cholesterol last visit was:   Lab Results  Component Value Date   CHOL 221* 04/30/2015   HDL 58 04/30/2015   LDLCALC 146* 04/30/2015   LDLDIRECT 151.2 01/03/2008   TRIG 86 04/30/2015   CHOLHDL 3.8 04/30/2015   Last A1C in the office was:  Lab Results  Component Value Date   HGBA1C 5.3 04/30/2015   Patient is on Vitamin D supplement.   Lab Results  Component Value Date   VD25OH 64 04/30/2015      Current Medications:  Current Outpatient Prescriptions on File Prior to Visit  Medication Sig Dispense Refill  . ALPRAZolam (XANAX) 0.5 MG tablet 1/2-1 pill as needed daily for anxiety 60 tablet 0  . amphetamine-dextroamphetamine (ADDERALL) 10 MG tablet Take 1 tablet (10 mg total) by mouth 2 (two) times daily with a meal. 60 tablet 0  . buPROPion (WELLBUTRIN XL) 150 MG 24 hr tablet TAKE 1 TABLET (150 MG TOTAL) BY MOUTH EVERY MORNING. 90 tablet 1  . cetirizine (ZYRTEC) 10 MG tablet Take 10 mg by mouth daily.    . Cholecalciferol (VITAMIN D3 MAXIMUM STRENGTH) 5000 UNITS capsule Take 5,000 Units by mouth daily.    . Cyanocobalamin (VITAMIN B 12 PO) Take by mouth daily.    . cycloSPORINE (RESTASIS) 0.05 % ophthalmic emulsion Place 1 drop into both eyes 2 (two) times daily.    . nabumetone (RELAFEN) 500 MG tablet Take 1 tablet (500 mg total) by mouth 2 (two) times daily as needed (for pain). 90 tablet 1  . Omega-3 Fatty Acids (FISH OIL) 1200 MG CAPS Take 1,200  mg by mouth daily.    Marland Kitchen OVER THE COUNTER MEDICATION Turmeric daily    . Phosphatidylserine-DHA-EPA (VAYACOG) 100-19.5-6.5 MG CAPS 2 a day 60 capsule 3  . triamcinolone (NASACORT ALLERGY 24HR) 55 MCG/ACT AERO nasal inhaler Place 2 sprays into the nose daily.     No current facility-administered medications on file prior to visit.   Medical History:  Past Medical History  Diagnosis Date  . Seasonal allergies   . Hemangioma     on liver   Allergies:  Allergies  Allergen Reactions  . Erythromycin Nausea And Vomiting     Review  of Systems:  Review of Systems  Constitutional: Negative.   HENT: Negative.   Eyes: Negative.   Respiratory: Negative.   Cardiovascular: Negative.   Gastrointestinal: Negative.   Genitourinary: Negative.   Musculoskeletal: Negative.   Skin: Negative.   Neurological: Negative.  Negative for dizziness, tingling, tremors, sensory change, speech change, focal weakness, seizures and loss of consciousness.  Endo/Heme/Allergies: Negative.   Psychiatric/Behavioral: Negative for depression, suicidal ideas, hallucinations, memory loss and substance abuse. The patient is nervous/anxious. The patient does not have insomnia.     Family history- Review and unchanged Social history- Review and unchanged Physical Exam: BP 116/80 mmHg  Pulse 84  Temp(Src) 97.5 F (36.4 C) (Temporal)  Resp 16  Ht 5\' 1"  (1.549 m)  Wt 122 lb (55.339 kg)  BMI 23.06 kg/m2  SpO2 96% Wt Readings from Last 3 Encounters:  08/05/15 122 lb (55.339 kg)  06/03/15 120 lb (54.432 kg)  04/30/15 116 lb (52.617 kg)   General Appearance: Well nourished, in no apparent distress. Eyes: PERRLA, EOMs, conjunctiva no swelling or erythema Sinuses: No Frontal/maxillary tenderness ENT/Mouth: Ext aud canals clear, TMs without erythema, bulging. No erythema, swelling, or exudate on post pharynx.  Tonsils not swollen or erythematous. Hearing normal.  Neck: Supple, thyroid normal.  Respiratory: Respiratory effort normal, BS equal bilaterally without rales, rhonchi, wheezing or stridor.  Cardio: RRR with no MRGs. Brisk peripheral pulses without edema.  Abdomen: Soft, + BS,  Non tender, no guarding, rebound, hernias, masses. Lymphatics: Non tender without lymphadenopathy.  Musculoskeletal: Full ROM, 5/5 strength, Normal gait Skin: Warm, dry without rashes, lesions, ecchymosis.  Neuro: Cranial nerves intact. Normal muscle tone, no cerebellar symptoms. Psych: Awake and oriented X 3, normal affect, Insight and Judgment appropriate.     Vicie Mutters, PA-C 11:34 AM [facility]

## 2015-08-13 ENCOUNTER — Encounter: Payer: Self-pay | Admitting: Physician Assistant

## 2015-08-14 MED ORDER — PHOSPHATIDYLSERINE-DHA-EPA 100-19.5-6.5 MG PO CAPS: ORAL_CAPSULE | ORAL | Status: DC

## 2015-09-07 ENCOUNTER — Encounter (INDEPENDENT_AMBULATORY_CARE_PROVIDER_SITE_OTHER): Payer: BLUE CROSS/BLUE SHIELD | Admitting: Ophthalmology

## 2015-10-07 ENCOUNTER — Encounter (INDEPENDENT_AMBULATORY_CARE_PROVIDER_SITE_OTHER): Payer: BLUE CROSS/BLUE SHIELD | Admitting: Ophthalmology

## 2015-10-07 DIAGNOSIS — H33301 Unspecified retinal break, right eye: Secondary | ICD-10-CM

## 2015-10-07 DIAGNOSIS — H43813 Vitreous degeneration, bilateral: Secondary | ICD-10-CM

## 2015-11-30 ENCOUNTER — Ambulatory Visit (INDEPENDENT_AMBULATORY_CARE_PROVIDER_SITE_OTHER): Payer: BLUE CROSS/BLUE SHIELD | Admitting: Ophthalmology

## 2015-11-30 DIAGNOSIS — H33301 Unspecified retinal break, right eye: Secondary | ICD-10-CM | POA: Diagnosis not present

## 2015-11-30 DIAGNOSIS — H2513 Age-related nuclear cataract, bilateral: Secondary | ICD-10-CM

## 2015-11-30 DIAGNOSIS — H43813 Vitreous degeneration, bilateral: Secondary | ICD-10-CM

## 2015-12-07 ENCOUNTER — Ambulatory Visit: Payer: Self-pay | Admitting: Physician Assistant

## 2015-12-16 ENCOUNTER — Ambulatory Visit: Payer: Self-pay | Admitting: Physician Assistant

## 2015-12-21 ENCOUNTER — Encounter: Payer: Self-pay | Admitting: Physician Assistant

## 2015-12-21 ENCOUNTER — Ambulatory Visit: Payer: BLUE CROSS/BLUE SHIELD | Admitting: Physician Assistant

## 2015-12-21 VITALS — BP 118/76 | HR 92 | Temp 97.0°F | Resp 16 | Ht 61.0 in | Wt 123.0 lb

## 2015-12-21 DIAGNOSIS — F411 Generalized anxiety disorder: Secondary | ICD-10-CM

## 2015-12-21 DIAGNOSIS — E785 Hyperlipidemia, unspecified: Secondary | ICD-10-CM

## 2015-12-21 DIAGNOSIS — F988 Other specified behavioral and emotional disorders with onset usually occurring in childhood and adolescence: Secondary | ICD-10-CM | POA: Insufficient documentation

## 2015-12-21 DIAGNOSIS — R0989 Other specified symptoms and signs involving the circulatory and respiratory systems: Secondary | ICD-10-CM

## 2015-12-21 DIAGNOSIS — E559 Vitamin D deficiency, unspecified: Secondary | ICD-10-CM

## 2015-12-21 LAB — HEPATIC FUNCTION PANEL
ALBUMIN: 4.4 g/dL (ref 3.6–5.1)
ALK PHOS: 95 U/L (ref 33–130)
ALT: 16 U/L (ref 6–29)
AST: 21 U/L (ref 10–35)
BILIRUBIN INDIRECT: 0.3 mg/dL (ref 0.2–1.2)
Bilirubin, Direct: 0.1 mg/dL (ref <=0.2)
TOTAL PROTEIN: 6.9 g/dL (ref 6.1–8.1)
Total Bilirubin: 0.4 mg/dL (ref 0.2–1.2)

## 2015-12-21 LAB — CBC WITH DIFFERENTIAL/PLATELET
BASOS PCT: 0 %
Basophils Absolute: 0 cells/uL (ref 0–200)
EOS ABS: 102 {cells}/uL (ref 15–500)
Eosinophils Relative: 2 %
HEMATOCRIT: 40.6 % (ref 35.0–45.0)
HEMOGLOBIN: 13.6 g/dL (ref 11.7–15.5)
LYMPHS ABS: 1632 {cells}/uL (ref 850–3900)
LYMPHS PCT: 32 %
MCH: 30.1 pg (ref 27.0–33.0)
MCHC: 33.5 g/dL (ref 32.0–36.0)
MCV: 89.8 fL (ref 80.0–100.0)
MONO ABS: 306 {cells}/uL (ref 200–950)
MPV: 10.5 fL (ref 7.5–12.5)
Monocytes Relative: 6 %
Neutro Abs: 3060 cells/uL (ref 1500–7800)
Neutrophils Relative %: 60 %
Platelets: 263 10*3/uL (ref 140–400)
RBC: 4.52 MIL/uL (ref 3.80–5.10)
RDW: 13.5 % (ref 11.0–15.0)
WBC: 5.1 10*3/uL (ref 3.8–10.8)

## 2015-12-21 LAB — LIPID PANEL
Cholesterol: 229 mg/dL — ABNORMAL HIGH (ref 125–200)
HDL: 66 mg/dL (ref >=46)
LDL CALC: 140 mg/dL — AB (ref <130)
TRIGLYCERIDES: 113 mg/dL (ref <150)
Total CHOL/HDL Ratio: 3.5 Ratio (ref <=5.0)
VLDL: 23 mg/dL (ref <30)

## 2015-12-21 LAB — BASIC METABOLIC PANEL WITH GFR
BUN: 11 mg/dL (ref 7–25)
CHLORIDE: 105 mmol/L (ref 98–110)
CO2: 26 mmol/L (ref 20–31)
Calcium: 9.4 mg/dL (ref 8.6–10.4)
Creat: 0.75 mg/dL (ref 0.50–1.05)
GFR, EST NON AFRICAN AMERICAN: 89 mL/min (ref >=60)
GLUCOSE: 96 mg/dL (ref 65–99)
POTASSIUM: 4.5 mmol/L (ref 3.5–5.3)
Sodium: 140 mmol/L (ref 135–146)

## 2015-12-21 LAB — TSH: TSH: 2.22 mIU/L

## 2015-12-21 MED ORDER — AMPHETAMINE-DEXTROAMPHETAMINE 10 MG PO TABS: 10.0000 mg | ORAL_TABLET | Freq: Two times a day (BID) | ORAL | Status: DC

## 2015-12-21 NOTE — Progress Notes (Signed)
Assessment and Plan:  1. Hypertension -Continue medication, monitor blood pressure at home. Continue DASH diet.  Reminder to go to the ER if any CP, SOB, nausea, dizziness, severe HA, changes vision/speech, left arm numbness and tingling and jaw pain.  2. Cholesterol -Continue diet and exercise. Check cholesterol next OV  3. Memory issues Continue adderall PRN, and continue vayacog, suggest counseling, normal neuro  4. Vitamin D Def - check level and continue medications.   5. Anxiety  suggest counseling.   Continue diet and meds as discussed. Further disposition pending results of labs. Over 30 minutes of exam, counseling, chart review, and critical decision making was performed Future Appointments Date Time Provider [place]  05/02/2016 2:00 PM Vicie Mutters, PA-C [facility] None    HPI 55 y.o. female  presents for 3 month follow up on hypertension, cholesterol, stress/memory issues, and vitamin D deficiency.   Her blood pressure has been controlled at home, today their BP is BP: 118/76 mmHg  She does not workout. She denies chest pain, shortness of breath, dizziness. She has been having increased stress with her daughter and work and having trouble concentrating/word finding issues, normal neuro exams. She states that she is feeling better with the adderall. Her kids are still causing stress at home but takes adderall at work an states able to focus better.   She is not on cholesterol medication and denies myalgias. Her cholesterol is not at goal. The cholesterol last visit was:   Lab Results  Component Value Date   CHOL 221* 04/30/2015   HDL 58 04/30/2015   LDLCALC 146* 04/30/2015   LDLDIRECT 151.2 01/03/2008   TRIG 86 04/30/2015   CHOLHDL 3.8 04/30/2015   Last A1C in the office was:  Lab Results  Component Value Date   HGBA1C 5.3 04/30/2015   Patient is on Vitamin D supplement.   Lab Results  Component Value Date   VD25OH 107 04/30/2015      Current  Medications:  Current Outpatient Prescriptions on File Prior to Visit  Medication Sig Dispense Refill  . ALPRAZolam (XANAX) 0.5 MG tablet 1/2-1 pill as needed daily for anxiety 60 tablet 0  . cetirizine (ZYRTEC) 10 MG tablet Take 10 mg by mouth daily.    . Cholecalciferol (VITAMIN D3 MAXIMUM STRENGTH) 5000 UNITS capsule Take 5,000 Units by mouth daily.    . Cyanocobalamin (VITAMIN B 12 PO) Take by mouth daily.    . cycloSPORINE (RESTASIS) 0.05 % ophthalmic emulsion Place 1 drop into both eyes 2 (two) times daily.    . hyoscyamine (LEVSIN SL) 0.125 MG SL tablet     . nabumetone (RELAFEN) 500 MG tablet Take 1 tablet (500 mg total) by mouth 2 (two) times daily as needed (for pain). 90 tablet 1  . Omega-3 Fatty Acids (FISH OIL) 1200 MG CAPS Take 1,200 mg by mouth daily.    Marland Kitchen OVER THE COUNTER MEDICATION Turmeric daily    . Phosphatidylserine-DHA-EPA (VAYACOG) 100-19.5-6.5 MG CAPS 2 a day 60 capsule 3  . promethazine (PHENERGAN) 25 MG tablet     . triamcinolone (NASACORT ALLERGY 24HR) 55 MCG/ACT AERO nasal inhaler Place 2 sprays into the nose daily.     No current facility-administered medications on file prior to visit.   Medical History:  Past Medical History  Diagnosis Date  . Seasonal allergies   . Hemangioma     on liver   Allergies:  Allergies  Allergen Reactions  . Erythromycin Nausea And Vomiting     Review  of Systems:  Review of Systems  Constitutional: Negative.   HENT: Negative.   Eyes: Negative.   Respiratory: Negative.   Cardiovascular: Negative.   Gastrointestinal: Negative.   Genitourinary: Negative.   Musculoskeletal: Negative.   Skin: Negative.   Neurological: Negative.  Negative for dizziness, tingling, tremors, sensory change, speech change, focal weakness, seizures and loss of consciousness.  Endo/Heme/Allergies: Negative.   Psychiatric/Behavioral: Negative for depression, suicidal ideas, hallucinations, memory loss and substance abuse. The patient is  nervous/anxious. The patient does not have insomnia.     Family history- Review and unchanged Social history- Review and unchanged Physical Exam: BP 118/76 mmHg  Pulse 92  Temp(Src) 97 F (36.1 C) (Temporal)  Resp 16  Ht 5\' 1"  (1.549 m)  Wt 123 lb (55.792 kg)  BMI 23.25 kg/m2  SpO2 96% Wt Readings from Last 3 Encounters:  12/21/15 123 lb (55.792 kg)  08/05/15 122 lb (55.339 kg)  06/03/15 120 lb (54.432 kg)   General Appearance: Well nourished, in no apparent distress. Eyes: PERRLA, EOMs, conjunctiva no swelling or erythema Sinuses: No Frontal/maxillary tenderness ENT/Mouth: Ext aud canals clear, TMs without erythema, bulging. No erythema, swelling, or exudate on post pharynx.  Tonsils not swollen or erythematous. Hearing normal.  Neck: Supple, thyroid normal.  Respiratory: Respiratory effort normal, BS equal bilaterally without rales, rhonchi, wheezing or stridor.  Cardio: RRR with no MRGs. Brisk peripheral pulses without edema.  Abdomen: Soft, + BS,  Non tender, no guarding, rebound, hernias, masses. Lymphatics: Non tender without lymphadenopathy.  Musculoskeletal: Full ROM, 5/5 strength, Normal gait Skin: Warm, dry without rashes, lesions, ecchymosis.  Neuro: Cranial nerves intact. Normal muscle tone, no cerebellar symptoms. Psych: Awake and oriented X 3, normal affect, Insight and Judgment appropriate.    Vicie Mutters, PA-C 11:39 AM [facility]

## 2015-12-21 NOTE — Patient Instructions (Addendum)
Counseling services  Dr. Arbutus Leas, Ph.D. [address] [place] [address] Phone: [phone], [place] [id number] [place] [address] [place] [address]   [facility] Hours: Monday-Thursday 830-8pm  Friday 830AM-7PM Address: [place] [phone].  Address: [place], [place] [address] [facility] [phone] office [email or web address] [address], [place], [place], [place] [address]  Rema Fendt, therapist  Toy Cookey, MA, clinical psychologist  Cognitive-Behavior Therapy; Mood Disorders; Anxiety Disorders; adult and child ADHD; Family Therapy; Stress Management; personal growth, and Marital Therapy.    Terrance Mass Ph.D., clinical psychologist Cognitive-Behavior Therapy; Mood Disorders; Anxiety Disorders; Stress     Management  [facility] [address] [place], [place] [address] [phone]   [facility] [place], psychotherapist [address] [place], [place] [address] [phone]  Karin Golden Ph.D., clinical psychologist [phone] office [place],  [phone] Cognitive Behavior Therapy, Depression, Bipolar, Anxiety, Grief and Loss      Medial Head Gastrocnemius Tear With Rehab Medial head gastrocnemius tear, also called tennis leg, is a tear (strain) in a muscle or tendon of the inner portion (medial head) of one of the calf muscles (gastrocnemius). The inner portion of the calf muscle attaches to the thigh bone (femur) and is responsible for bending the knee and straightening the foot (standing "on tiptoe"). Strains are classified into three categories. Grade 1 strains cause pain, but the tendon is not lengthened. Grade 2 strains include a lengthened ligament, due to the ligament being stretched or partially  ruptured. With grade 2 strains there is still function, although function may be decreased. Grade 3 strains involve a complete tear of the tendon or muscle, and function is usually impaired. SYMPTOMS   Sudden "pop" or tear felt at the time of injury.  Pain, tenderness, swelling, warmth, or redness over the middle inner calf.  Pain and weakness with ankle motion, especially flexing the ankle against resistance, as well as pain with lifting up the foot (extending the ankle).  Bruising (contusion) of the calf, heel, and sometimes the foot within 48 hours of injury.  Muscle spasm in the calf. CAUSES  Muscle and ligament strains occur when a force is placed on the muscle or ligament that is greater than it can handle. Common causes of injury include:  Direct hit (trauma) to the calf.  Sudden forceful pushing off or landing on the foot (jumping, landing, serving a tennis ball, lunging). RISK INCREASES WITH:  Sports that require sudden, explosive calf muscle contraction, such as those involving jumping (basketball), hill running, quick starts (running), or lunging (racquetball, tennis).  Contact sports (football, soccer, hockey).  Poor strength and flexibility.  Previous lower limb injury. PREVENTION  Warm up and stretch properly before activity.  Allow for adequate recovery between workouts.  Maintain physical fitness:  Strength, flexibility, and endurance.  Cardiovascular fitness.  Learn and use proper exercise technique.  Complete rehabilitation after lower limb injury, before returning to competition or practice. PROGNOSIS  If treated properly, tennis leg usually heals within 6 weeks of nonsurgical treatment.  RELATED COMPLICATIONS   Longer healing time, if not properly treated or if not given enough time to heal.  Recurring symptoms and injury, if activity is resumed too soon, with overuse, with a direct blow, or with poor technique.  If untreated, may progress to a  complete  tear (rare) or other injury, due to limping and favoring of the injured leg.  Persistent limping, due to scarring and shortening of the calf muscles, as a result of inadequate rehabilitation.  Prolonged disability. TREATMENT  Treatment first involves the use of ice and medication to help reduce pain and inflammation. The use of strengthening and stretching exercises may help reduce pain with activity. These exercises may be performed at home or with a therapist. For severe injuries, referral to a therapist may be needed for further evaluation and treatment. Your caregiver may advise that you wear a brace to help healing. Sometimes, crutches are needed until you can walk without limping. Rarely, surgery is needed.  MEDICATION   If pain medicine is needed, nonsteroidal anti-inflammatory medicines (aspirin and ibuprofen), or other minor pain relievers (acetaminophen), are often advised.  Do not take pain medicine for 7 days before surgery.  Prescription pain relievers may be given, if your caregiver thinks they are needed. Use only as directed and only as much as you need. HEAT AND COLD  Cold treatment (icing) should be applied for 10 to 15 minutes every 2 to 3 hours for inflammation and pain, and immediately after activity that aggravates your symptoms. Use ice packs or an ice massage.  Heat treatment may be used before performing stretching and strengthening activities prescribed by your caregiver, physical therapist, or athletic trainer. Use a heat pack or a warm water soak. SEEK MEDICAL CARE IF:   Symptoms get worse or do not improve in 2 weeks, despite treatment.  Numbness or tingling develops.  New, unexplained symptoms develop. (Drugs used in treatment may produce side effects.) EXERCISES  RANGE OF MOTION (ROM) AND STRETCHING EXERCISES - Medial Head Gastrocnemius Tear (Tennis Leg) These exercises may help you when beginning to rehabilitate your injury. Your symptoms may  resolve with or without further involvement from your physician, physical therapist, or athletic trainer. While completing these exercises, remember:   Restoring tissue flexibility helps normal motion to return to the joints. This allows healthier, less painful movement and activity.  An effective stretch should be held for at least 30 seconds.  A stretch should never be painful. You should only feel a gentle lengthening or release in the stretched tissue. STRETCH - Gastrocsoleus  Sit with your right / left leg extended. Holding onto both ends of a belt or towel, loop it around the ball of your foot.  Keeping your right / left ankle and foot relaxed and your knee straight, pull your foot and ankle toward you using the belt.  You should feel a gentle stretch behind your calf or knee. Hold this position for __________ seconds. Repeat __________ times. Complete this stretch __________ times per day.  RANGE OF MOTION - Ankle Dorsiflexion, Active Assisted   Remove your shoes and sit on a chair, preferably not on a carpeted surface.  Place your right / left foot directly under the knee. Extend your opposite leg for support.  Keeping your heel down, slide your right / left foot back toward the chair, until you feel a stretch at your ankle or calf. If you do not feel a stretch, slide your bottom forward to the edge of the chair, while still keeping your heel down.  Hold this stretch for __________ seconds. Repeat __________ times. Complete this stretch __________ times per day.  STRETCH - Gastroc, Standing   Place your hands on a wall.  Extend your right / left leg behind you, keeping the front knee somewhat  bent.  Slightly point your toes inward on your back foot.  Keeping your right / left heel on the floor and your knee straight, shift your weight toward the wall, not allowing your back to arch.  You should feel a gentle stretch in the right / left calf. Hold this position for  __________ seconds. Repeat __________ times. Complete this stretch __________ times per day. STRETCH - Soleus, Standing   Place your hands on a wall.  Extend your right / left leg behind you, keeping the other knee somewhat bent.  Point your toes of your back foot slightly inward.  Keep your right / left heel on the floor, bend your back knee, and slightly shift your weight over the back leg so that you feel a gentle stretch deep in your back calf.  Hold this position for __________ seconds. Repeat __________ times. Complete this stretch __________ times per day. STRETCH - Gastrocsoleus, Standing Note: This exercise can place a lot of stress on your foot and ankle. Please complete this exercise only if specifically instructed by your caregiver.   Place the ball of your right / left foot on a step, keeping your other foot firmly on the same step.  Hold on to the wall or a rail for balance.  Slowly lift your other foot, allowing your body weight to press your heel down over the edge of the step.  You should feel a stretch in your right / left calf.  Hold this position for __________ seconds.  Repeat this exercise with a slight bend in your right / left knee. Repeat __________ times. Complete this stretch __________ times per day.  STRENGTHENING EXERCISES - Medial Head Gastrocnemius Tear (Tennis Leg) These exercises may help you when beginning to rehabilitate your injury. They may resolve your symptoms with or without further involvement from your physician, physical therapist, or athletic trainer. While completing these exercises, remember:   Muscles can gain both the endurance and the strength needed for everyday activities through controlled exercises.  Complete these exercises as instructed by your physician, physical therapist, or athletic trainer. Increase the resistance and repetitions only as guided by your caregiver. STRENGTH - Plantar-flexors  Sit with your right / left  leg extended. Holding onto both ends of a rubber exercise band or tubing, loop it around the ball of your foot. Keep a slight tension in the band.  Slowly push your toes away from you, pointing them downward.  Hold this position for __________ seconds. Return slowly, controlling the tension in the band. Repeat __________ times. Complete this exercise __________ times per day.  STRENGTH - Plantar-flexors  Stand with your feet shoulder width apart. Steady yourself with a wall or table, using as little support as needed.  Keeping your weight evenly spread over the width of your feet, rise up on your toes.*  Hold this position for __________ seconds. Repeat __________ times. Complete this exercise __________ times per day.  *If this is too easy, shift your weight toward your right / left leg until you feel challenged. Ultimately, you may be asked to do this exercise while standing on your right / left foot only. STRENGTH - Plantar-flexors, Eccentric Note: This exercise can place a lot of stress on your foot and ankle. Please complete this exercise only if specifically instructed by your caregiver.   Place the balls of your feet on a step. With your hands, use only enough support from a wall or rail to keep your balance.  Keep your  knees straight and rise up on your toes.  Slowly shift your weight entirely to your right / left toes and pick up your opposite foot. Gently and with controlled movement, lower your weight through your right / left foot so that your heel drops below the level of the step. You will feel a slight stretch in the back of your right / left calf.  Use the healthy leg to help rise up onto the balls of both feet, then lower weight only onto the right / left leg again. Build up to 15 repetitions. Then progress to 3 sets of 15 repetitions.*  After completing the above exercise, complete the same exercise with a slight knee bend (about 30 degrees). Again, build up to 15  repetitions. Then progress to 3 sets of 15 repetitions.* Perform this exercise __________ times per day.  *When you easily complete 3 sets of 15, your physician, physical therapist, or athletic trainer may advise you to add resistance, by wearing a backpack filled with additional weight.   This information is not intended to replace advice given to you by your health care provider. Make sure you discuss any questions you have with your health care provider.   Document Released: 07/25/2005 Document Revised: 08/15/2014 Document Reviewed: 11/06/2008 Elsevier Interactive Patient Education [employer].

## 2015-12-22 LAB — VITAMIN D 25 HYDROXY (VIT D DEFICIENCY, FRACTURES): VIT D 25 HYDROXY: 48 ng/mL (ref 30–100)

## 2016-02-08 ENCOUNTER — Encounter: Payer: Self-pay | Admitting: Physician Assistant

## 2016-02-13 ENCOUNTER — Other Ambulatory Visit: Payer: Self-pay | Admitting: Physician Assistant

## 2016-02-17 ENCOUNTER — Encounter: Payer: Self-pay | Admitting: Internal Medicine

## 2016-02-17 ENCOUNTER — Ambulatory Visit (INDEPENDENT_AMBULATORY_CARE_PROVIDER_SITE_OTHER): Payer: BLUE CROSS/BLUE SHIELD | Admitting: Internal Medicine

## 2016-02-17 VITALS — BP 106/72 | HR 68 | Temp 97.6°F | Resp 16 | Ht 62.0 in | Wt 120.4 lb

## 2016-02-17 DIAGNOSIS — M2550 Pain in unspecified joint: Secondary | ICD-10-CM | POA: Diagnosis not present

## 2016-02-17 NOTE — Patient Instructions (Signed)
Tumeric 900 mg with   Bioperine (Extract of fruit of black pepper)   ([email or web address])

## 2016-02-18 ENCOUNTER — Other Ambulatory Visit: Payer: Self-pay | Admitting: Internal Medicine

## 2016-02-18 ENCOUNTER — Encounter: Payer: Self-pay | Admitting: Internal Medicine

## 2016-02-18 LAB — SEDIMENTATION RATE: Sed Rate: 1 mm/hr (ref 0–30)

## 2016-02-18 LAB — ANTI-DNA ANTIBODY, DOUBLE-STRANDED

## 2016-02-18 LAB — C-REACTIVE PROTEIN

## 2016-02-18 LAB — RHEUMATOID FACTOR

## 2016-02-18 LAB — CYCLIC CITRUL PEPTIDE ANTIBODY, IGG

## 2016-02-18 NOTE — Progress Notes (Signed)
  Subjective:    Patient ID: Amanda Salazar, female    DOB: [birth year], 55 y.o.   MRN: [id number]  HPI  This very nice 55 yo MWF presents with2-3 week hx/o aching in her 5th fingers bilaterally and denies any trauma.  Medication Sig  . ALPRAZolam (XANAX) 0.5 MG tablet 1/2-1 pill as needed daily for anxiety  . amphetamine-dextroamphetamine (ADDERALL) 10 MG tablet Take 1 tablet (10 mg total) by mouth 2 (two) times daily with a meal.  . cetirizine (ZYRTEC) 10 MG tablet Take 10 mg by mouth daily.  . Cholecalciferol (VITAMIN D3 MAXIMUM STRENGTH) 5000 UNITS capsule Take 5,000 Units by mouth daily.  . Cyanocobalamin (VITAMIN B 12 PO) Take by mouth daily.  . cycloSPORINE (RESTASIS) 0.05 % ophthalmic emulsion Place 1 drop into both eyes 2 (two) times daily.  . hyoscyamine (LEVSIN SL) 0.125 MG SL tablet   . nabumetone (RELAFEN) 500 MG tablet Take 1 tablet (500 mg total) by mouth 2 (two) times daily as needed (for pain).  . Omega-3 Fatty Acids (FISH OIL) 1200 MG CAPS Take 1,200 mg by mouth daily.  Marland Kitchen OVER THE COUNTER MEDICATION Turmeric daily  . Phosphatidylserine-DHA-EPA (VAYACOG) 100-19.5-6.5 MG CAPS 2 a day  . promethazine (PHENERGAN) 25 MG tablet   . triamcinolone (NASACORT ALLERGY 24HR) 55 MCG/ACT AERO nasal inhaler Place 2 sprays into the nose daily.  Marland Kitchen buPROPion (WELLBUTRIN XL) 150 MG 24 hr tablet TAKE 1 TABLET (150 MG TOTAL) BY MOUTH EVERY MORNING.    Allergies  Allergen Reactions  . Erythromycin Nausea And Vomiting   Past Medical History  Diagnosis Date  . Seasonal allergies   . Hemangioma     on liver   Review of Systems 10 point systems review negative except as above.     Objective:   Physical Exam  BP 106/72 mmHg  Pulse 68  Temp(Src) 97.6 F (36.4 C)  Resp 16  Ht 5\' 2"  (1.575 m)  Wt 120 lb 6.4 oz (54.613 kg)  BMI 22.02 kg/m2  Skin - exposed clear w/o rash.   HEENT - Eac's patent. TM's Nl. EOM's full. PERRLA. NasoOroPharynx clear. Neck - supple. Nl Thyroid. Carotids  2+ & No bruits, nodes, JVD Chest - Clear equal BS w/o Rales, rhonchi, wheezes. Cor - Nl HS. RRR w/o sig MGR. MS- FROM w/o deformities. Muscle power, tone and bulk Nl. Gait Nl.(+) tenderness if PIP & DIP of 5th digits bilaterally.  Neuro - No obvious Cr N abnormalities. Sensory, motor and Cerebellar functions appear Nl w/o focal abnormalities.    Assessment & Plan:   1. Pain in joint, multiple sites  - Sedimentation rate - Anti-DNA antibody, double-stranded - Cyclic citrul peptide antibody, IgG - Rheumatoid factor - C-reactive protein  - disposition f/o serologic w/u to r/o autoimmune dz.

## 2016-05-02 ENCOUNTER — Encounter: Payer: Self-pay | Admitting: Physician Assistant

## 2016-06-02 ENCOUNTER — Other Ambulatory Visit: Payer: Self-pay | Admitting: Internal Medicine

## 2016-06-02 ENCOUNTER — Encounter: Payer: Self-pay | Admitting: Physician Assistant

## 2016-06-02 ENCOUNTER — Other Ambulatory Visit: Payer: Self-pay | Admitting: Physician Assistant

## 2016-06-02 DIAGNOSIS — Z1231 Encounter for screening mammogram for malignant neoplasm of breast: Secondary | ICD-10-CM

## 2016-06-02 MED ORDER — AMPHETAMINE-DEXTROAMPHETAMINE 10 MG PO TABS: 10.0000 mg | ORAL_TABLET | Freq: Two times a day (BID) | ORAL | 0 refills | Status: DC

## 2016-06-28 ENCOUNTER — Encounter: Payer: Self-pay | Admitting: Physician Assistant

## 2016-06-28 ENCOUNTER — Ambulatory Visit (INDEPENDENT_AMBULATORY_CARE_PROVIDER_SITE_OTHER): Payer: Managed Care, Other (non HMO) | Admitting: Physician Assistant

## 2016-06-28 ENCOUNTER — Other Ambulatory Visit (HOSPITAL_COMMUNITY)
Admission: RE | Admit: 2016-06-28 | Discharge: 2016-06-28 | Disposition: A | Discharge status: Home / Self Care | Payer: Managed Care, Other (non HMO) | Source: Ambulatory Visit | Attending: Physician Assistant | Admitting: Physician Assistant

## 2016-06-28 VITALS — BP 124/80 | HR 72 | Temp 97.3°F | Resp 16 | Ht 62.0 in | Wt 124.8 lb

## 2016-06-28 DIAGNOSIS — Z1151 Encounter for screening for human papillomavirus (HPV): Secondary | ICD-10-CM | POA: Insufficient documentation

## 2016-06-28 DIAGNOSIS — Z1389 Encounter for screening for other disorder: Secondary | ICD-10-CM

## 2016-06-28 DIAGNOSIS — Z01419 Encounter for gynecological examination (general) (routine) without abnormal findings: Secondary | ICD-10-CM | POA: Insufficient documentation

## 2016-06-28 DIAGNOSIS — J309 Allergic rhinitis, unspecified: Secondary | ICD-10-CM

## 2016-06-28 DIAGNOSIS — C4491 Basal cell carcinoma of skin, unspecified: Secondary | ICD-10-CM | POA: Diagnosis not present

## 2016-06-28 DIAGNOSIS — E559 Vitamin D deficiency, unspecified: Secondary | ICD-10-CM | POA: Diagnosis not present

## 2016-06-28 DIAGNOSIS — R6889 Other general symptoms and signs: Secondary | ICD-10-CM

## 2016-06-28 DIAGNOSIS — F411 Generalized anxiety disorder: Secondary | ICD-10-CM | POA: Diagnosis not present

## 2016-06-28 DIAGNOSIS — K219 Gastro-esophageal reflux disease without esophagitis: Secondary | ICD-10-CM

## 2016-06-28 DIAGNOSIS — I10 Essential (primary) hypertension: Secondary | ICD-10-CM | POA: Diagnosis not present

## 2016-06-28 DIAGNOSIS — Z124 Encounter for screening for malignant neoplasm of cervix: Secondary | ICD-10-CM

## 2016-06-28 DIAGNOSIS — Z131 Encounter for screening for diabetes mellitus: Secondary | ICD-10-CM

## 2016-06-28 DIAGNOSIS — R922 Inconclusive mammogram: Secondary | ICD-10-CM

## 2016-06-28 DIAGNOSIS — E785 Hyperlipidemia, unspecified: Secondary | ICD-10-CM

## 2016-06-28 DIAGNOSIS — Z0000 Encounter for general adult medical examination without abnormal findings: Secondary | ICD-10-CM

## 2016-06-28 DIAGNOSIS — Z0001 Encounter for general adult medical examination with abnormal findings: Secondary | ICD-10-CM | POA: Diagnosis not present

## 2016-06-28 DIAGNOSIS — Z79899 Other long term (current) drug therapy: Secondary | ICD-10-CM | POA: Diagnosis not present

## 2016-06-28 DIAGNOSIS — F988 Other specified behavioral and emotional disorders with onset usually occurring in childhood and adolescence: Secondary | ICD-10-CM

## 2016-06-28 DIAGNOSIS — R0989 Other specified symptoms and signs involving the circulatory and respiratory systems: Secondary | ICD-10-CM

## 2016-06-28 LAB — CBC WITH DIFFERENTIAL/PLATELET
Basophils Absolute: 0 cells/uL (ref 0–200)
Basophils Relative: 0 %
EOS PCT: 3 %
Eosinophils Absolute: 150 cells/uL (ref 15–500)
HCT: 41.7 % (ref 35.0–45.0)
HEMOGLOBIN: 13.8 g/dL (ref 11.7–15.5)
LYMPHS ABS: 1850 {cells}/uL (ref 850–3900)
Lymphocytes Relative: 37 %
MCH: 30.5 pg (ref 27.0–33.0)
MCHC: 33.1 g/dL (ref 32.0–36.0)
MCV: 92.1 fL (ref 80.0–100.0)
MPV: 10.4 fL (ref 7.5–12.5)
Monocytes Absolute: 400 cells/uL (ref 200–950)
Monocytes Relative: 8 %
NEUTROS ABS: 2600 {cells}/uL (ref 1500–7800)
NEUTROS PCT: 52 %
PLATELETS: 276 10*3/uL (ref 140–400)
RBC: 4.53 MIL/uL (ref 3.80–5.10)
RDW: 13.4 % (ref 11.0–15.0)
WBC: 5 10*3/uL (ref 3.8–10.8)

## 2016-06-28 NOTE — Patient Instructions (Addendum)
Tumeric with black pepper extract is a great natural antiinflammatory that helps with arthritis and aches and pain. Can get from [employer] or any health food store. Need to take at least 800mg  twice a day with food.   VAGINAL DRYNESS OVERVIEW  Vaginal dryness, also known as atrophic vaginitis, is a common condition in postmenopausal women. This condition is also common in women who have had both ovaries removed at the time of hysterectomy.   Some women have uncomfortable symptoms of vaginal dryness, such as pain with sex, burning vaginal discomfort or itching, or abnormal vaginal discharge, while others have no symptoms at all.  VAGINAL DRYNESS CAUSES   Estrogen helps to keep the vagina moist and to maintain thickness of the vaginal lining. Vaginal dryness occurs when the ovaries produce a decreased amount of estrogen. This can occur at certain times in a woman's life, and may be permanent or temporary. Times when less estrogen is made include: ?At the time of menopause. ?After surgical removal of the ovaries, chemotherapy, or radiation therapy of the pelvis for cancer. ?After having a baby, particularly in women who breastfeed. ?While using certain medications, such as danazol, medroxyprogesterone (brand names: Provera or DepoProvera), leuprolide (brand name: Lupron), or nafarelin. When these medications are stopped, estrogen production resumes.  Women who smoke cigarettes have been shown to have an increased risk of an earlier menopause transition as compared to non-smokers. Therefore, atrophic vaginitis symptoms may appear at a younger age in this population.  VAGINAL DRYNESS TREATMENT   There are three treatment options for women with vaginal dryness:  Vaginal lubricants and moisturizers - Vaginal lubricants and moisturizers can be purchased without a prescription. These products do not contain any hormones and have virtually no side effects. - Albolene is found in the facial cleanser section at  [facility], [facility], or [facility]. It is a large jar with a blue top. This is the best lubricant for women because it is hypoallergenic. -Natural lubricants, such as olive, avocado or peanut oil, are easily available products that may be used as a lubricant with sex.  -Vaginal moisturizes (eg, Replens, Moist Again, Vagisil, K-Y Silk-E, and Feminease) are formulated to allow water to be retained in the vaginal tissues. Moisturizers are applied into the vagina three times weekly to allow a continued moisturizing effect. These should not be used just before having sex, as they can be irritating.  Vaginal estrogen - Vaginal estrogen is the most effective treatment option for women with vaginal dryness. Vaginal estrogen must be prescribed by a healthcare provider. Very low doses of vaginal estrogen can be used when it is put into the vagina to treat vaginal dryness. A small amount of estrogen is absorbed into the bloodstream, but only about 100 times less than when using estrogen pills or tablets. As a result, there is a much lower risk of side effects, such as blood clots, breast cancer, and heart attack, compared with other estrogen-containing products (birth control pills, menopausal hormone therapy).   Ospemifene - Ospemifene is a prescription medication that is similar to estrogen, but is not estrogen. In the vaginal tissue, it acts similarly to estrogen. In the breast tissue, it acts as an estrogen blocker. It comes in a pill, and is prescribed for women who want to use an estrogen-like medication for vaginal dryness or painful sex associated with vaginal dryness, but prefer not to use a vaginal medication. The medication may cause hot flashes as a side effect. This type of medication may increase the risk  of blood clots or uterine cancer. Further study of ospemifene is needed to evaluate the risk of these complications. This medication has not been tested in women who have had breast cancer or are at a high risk  of developing breast cancer.    Sexual activity - Vaginal estrogen improves vaginal dryness quickly, usually within a few weeks. You may continue to have sex as you treat vaginal dryness because sex itself can help to keep the vaginal tissues healthy. Vaginal intercourse may help the vaginal tissues by keeping them soft and stretchable and preventing the tissues from shrinking.  If sex continues to be painful despite treatment for vaginal dryness, talk to your healthcare provider.     Pityriasis Rosea Introduction Pityriasis rosea is a rash that usually appears on the trunk of the body. It may also appear on the upper arms and upper legs. It usually begins as a single patch, and then more patches begin to develop. The rash may cause mild itching, but it normally does not cause other problems. It usually goes away without treatment. However, it may take weeks or months for the rash to go away completely. What are the causes? The cause of this condition is not known. The condition does not spread from person to person (is noncontagious). What increases the risk? This condition is more likely to develop in young adults and children. It is most common in the spring and fall. What are the signs or symptoms? The main symptom of this condition is a rash.  The rash usually begins with a single oval patch that is larger than the ones that follow. This is called a herald patch. It generally appears a week or more before the rest of the rash appears.  When more patches start to develop, they spread quickly on the trunk, back, and arms. These patches are smaller than the first one.  The patches that make up the rash are usually oval-shaped and pink or red in color. They are usually flat, but they may sometimes be raised so that they can be felt with a finger. They may also be finely crinkled and have a scaly ring around the edge.  The rash does not typically appear on areas of the skin that are exposed  to the sun. Most people who have this condition do not have other symptoms, but some have mild itching. In a few cases, a mild headache or body aches may occur before the rash appears and then go away. How is this diagnosed? Your health care provider may diagnose this condition by doing a physical exam and taking your medical history. To rule out other possible causes for the rash, the health care provider may order blood tests or take a skin sample from the rash to be looked at under a microscope. How is this treated? Usually, treatment is not needed for this condition. The rash will probably go away on its own in 4-8 weeks. In some cases, a health care provider may recommend or prescribe medicine to reduce itching. Follow these instructions at home:  Take medicines only as directed by your health care provider.  Avoid scratching the affected areas of skin.  Do not take hot baths or use a sauna. Use only warm water when bathing or showering. Heat can increase itching. Contact a health care provider if:  Your rash does not go away in 8 weeks.  Your rash gets much worse.  You have a fever.  You have swelling or pain  in the rash area.  You have fluid, blood, or pus coming from the rash area. This information is not intended to replace advice given to you by your health care provider. Make sure you discuss any questions you have with your health care provider. Document Released: 08/31/2001 Document Revised: 12/31/2015 Document Reviewed: 07/02/2014  2017 Elsevier

## 2016-06-28 NOTE — Progress Notes (Signed)
Complete Physical  Assessment and Plan: Hyperlipidemia -continue medications, check lipids, decrease fatty foods, increase activity.  - CBC with Differential/Platelet - BASIC METABOLIC PANEL WITH GFR - Hepatic function panel - Lipid panel - TSH - EKG 12-Lead   Dense breast tissue Get 3D MGM   Basal cell carcinoma Follow up tafeen  Generalized anxiety disorder Continue medications  Gastroesophageal reflux disease, esophagitis presence not specified Continue PPI/H2 blocker, diet discussed  Allergic rhinitis, unspecified allergic rhinitis type Continue OTC allergy pills  Screening for blood or protein in urine - Urinalysis, Routine w reflex microscopic (not at [facility]) - Microalbumin / creatinine urine ratio  Routine general medical examination at a health care facility - BASIC METABOLIC PANEL WITH GFR - Hepatic function panel - Lipid panel - TSH - Hemoglobin A1c - Insulin, fasting - Magnesium - Vit D  25 hydroxy (rtn osteoporosis monitoring) - Urinalysis, Routine w reflex microscopic (not at [facility]) - Microalbumin / creatinine urine ratio - Iron and TIBC - Ferritin - EKG 12-Lead   Medication management - Magnesium   Vitamin D deficiency - Vit D  25 hydroxy (rtn osteoporosis monitoring)  Screening for diabetes mellitus - Hemoglobin A1c - Insulin, fasting  Anemia, unspecified anemia type - Iron and TIBC - Ferritin  Screening for cervical cancer PAP  Discussed med's effects and SE's. Screening labs and tests as requested with regular follow-up as recommended.  HPI 55 y.o. female  presents for a complete physical.  Her blood pressure has been controlled at home, today their BP is BP: 124/80 She does not workout. She denies chest pain, shortness of breath, dizziness.  She is not on cholesterol medication and denies myalgias. Her cholesterol is at goal. The cholesterol last visit was:   Lab Results  Component Value Date   CHOL 229 (H) 12/21/2015   HDL 66  12/21/2015   LDLCALC 140 (H) 12/21/2015   LDLDIRECT 151.2 01/03/2008   TRIG 113 12/21/2015   CHOLHDL 3.5 12/21/2015   Last A1C in the office was:  Lab Results  Component Value Date   HGBA1C 5.3 04/30/2015   Patient is on Vitamin D supplement.  Lab Results  Component Value Date   VD25OH 48 12/21/2015   Lab Results  Component Value Date   VITAMINB12 1,613 (H) 04/30/2015   Lab Results  Component Value Date   IRON 97 04/30/2015   TIBC 301 04/30/2015   FERRITIN 105 04/30/2015   She has quit her job with pay cut but she is now working in the office, works at [employer] , 3rd week, has some increased stress with this but hoping it will be better in the long run. States daughter [name] is doing better, should grad in May. Rarely takes adderall at work.  Has had feet/hand pain, has had negative work up for autoimmune and is on tumeric that is helping.    Current Medications:  Current Outpatient Prescriptions on File Prior to Visit  Medication Sig Dispense Refill  . ALPRAZolam (XANAX) 0.5 MG tablet 1/2-1 pill as needed daily for anxiety 60 tablet 0  . amphetamine-dextroamphetamine (ADDERALL) 10 MG tablet Take 1 tablet (10 mg total) by mouth 2 (two) times daily with a meal. 60 tablet 0  . cetirizine (ZYRTEC) 10 MG tablet Take 10 mg by mouth daily.    . Cholecalciferol (VITAMIN D3 MAXIMUM STRENGTH) 5000 UNITS capsule Take 5,000 Units by mouth daily.    . Cyanocobalamin (VITAMIN B 12 PO) Take by mouth daily.    Marland Kitchen  cycloSPORINE (RESTASIS) 0.05 % ophthalmic emulsion Place 1 drop into both eyes 2 (two) times daily.    . hyoscyamine (LEVSIN SL) 0.125 MG SL tablet     . nabumetone (RELAFEN) 500 MG tablet Take 1 tablet (500 mg total) by mouth 2 (two) times daily as needed (for pain). 90 tablet 1  . Omega-3 Fatty Acids (FISH OIL) 1200 MG CAPS Take 1,200 mg by mouth daily.    Marland Kitchen OVER THE COUNTER MEDICATION Turmeric daily    . Phosphatidylserine-DHA-EPA (VAYACOG) 100-19.5-6.5 MG CAPS 2  a day 60 capsule 3  . promethazine (PHENERGAN) 25 MG tablet     . triamcinolone (NASACORT ALLERGY 24HR) 55 MCG/ACT AERO nasal inhaler Place 2 sprays into the nose daily.     No current facility-administered medications on file prior to visit.    Health Maintenance:   Immunization History  Administered Date(s) Administered  . Tdap 04/16/2012   Tetanus: 2013 Pneumovax: N/A Prevnar 13: due age 13 Flu vaccine: declines Zostavax: due at 89 Pap: 2014 normal due 3 years/2017 MGM: 04/2015 DUE suggest 3D, getting tomorrow DEXA: Colonoscopy:2011 EGD:  Medical History:  Past Medical History:  Diagnosis Date  . 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 Seasonal allergies    Allergies Allergies  Allergen Reactions  . Erythromycin Nausea And Vomiting    SURGICAL HISTORY She  has a past surgical history that includes Breast surgery and Cesarean section. FAMILY HISTORY Her family history includes Hypertension in her mother. SOCIAL HISTORY She  reports that she has never smoked. She does not have any smokeless tobacco history on file. She reports that she drinks about 0.6 oz of alcohol per week . She reports that she does not use drugs.  Review of Systems  Constitutional: Negative.   HENT: Negative.   Eyes: Negative.   Respiratory: Negative.   Cardiovascular: Negative.   Gastrointestinal: Negative.   Genitourinary: Negative.   Musculoskeletal: Negative.   Skin: Negative.   Neurological: Negative.  Negative for dizziness, tingling, tremors, sensory change, speech change, focal weakness, seizures and loss of consciousness.  Endo/Heme/Allergies: Negative.   Psychiatric/Behavioral: Positive for depression and memory loss. Negative for hallucinations, substance abuse and suicidal ideas. The patient is nervous/anxious and has insomnia.      Physical Exam: Estimated body mass index is 22.83 kg/m as calculated from the following:   Height as of this encounter: 5\' 2"  (1.575 m).   Weight as of  this encounter: 124 lb 12.8 oz (56.6 kg). BP 124/80   Pulse 72   Temp 97.3 F (36.3 C)   Resp 16   Ht 5\' 2"  (1.575 m)   Wt 124 lb 12.8 oz (56.6 kg)   BMI 22.83 kg/m  General Appearance: Well nourished, in no apparent distress. Eyes: PERRLA, EOMs, conjunctiva no swelling or erythema, normal fundi and vessels. Sinuses: No Frontal/maxillary tenderness ENT/Mouth: Ext aud canals clear, normal light reflex with TMs without erythema, bulging.  Good dentition. No erythema, swelling, or exudate on post pharynx. Tonsils not swollen or erythematous. Hearing normal.  Neck: Supple, thyroid normal. No bruits Respiratory: Respiratory effort normal, BS equal bilaterally without rales, rhonchi, wheezing or stridor. Cardio: RRR without murmurs, rubs or gallops. Brisk peripheral pulses without edema.  Chest: symmetric, with normal excursions and percussion. Breasts: Symmetric, without lumps, nipple discharge, retractions. Abdomen: Soft, +BS. Non tender, no guarding, rebound, hernias, masses, or organomegaly. .  Lymphatics: Non tender without lymphadenopathy.  Genitourinary: decline Musculoskeletal: Full ROM all peripheral extremities,5/5 strength, and normal gait.  Skin: Warm, dry without rashes, lesions, ecchymosis.  Neuro: Cranial nerves intact, reflexes equal bilaterally. Normal muscle tone, no cerebellar symptoms. Sensation intact.  Psych: Awake and oriented X 3, normal affect, Insight and Judgment appropriate.   EKG: defer   Vicie Mutters 9:28 AM

## 2016-06-29 ENCOUNTER — Ambulatory Visit
Admission: RE | Admit: 2016-06-29 | Discharge: 2016-06-29 | Disposition: A | Discharge status: Home / Self Care | Payer: Managed Care, Other (non HMO) | Source: Ambulatory Visit | Attending: Internal Medicine | Admitting: Internal Medicine

## 2016-06-29 DIAGNOSIS — Z1231 Encounter for screening mammogram for malignant neoplasm of breast: Secondary | ICD-10-CM

## 2016-06-29 LAB — TSH: TSH: 2.55 mIU/L

## 2016-06-29 LAB — BASIC METABOLIC PANEL WITH GFR
BUN: 12 mg/dL (ref 7–25)
CALCIUM: 9.4 mg/dL (ref 8.6–10.4)
CO2: 26 mmol/L (ref 20–31)
Chloride: 106 mmol/L (ref 98–110)
Creat: 0.78 mg/dL (ref 0.50–1.05)
GFR, EST NON AFRICAN AMERICAN: 85 mL/min (ref >=60)
Glucose, Bld: 80 mg/dL (ref 65–99)
Potassium: 4 mmol/L (ref 3.5–5.3)
SODIUM: 143 mmol/L (ref 135–146)

## 2016-06-29 LAB — HEMOGLOBIN A1C
Hgb A1c MFr Bld: 4.8 % (ref <5.7)
Mean Plasma Glucose: 91 mg/dL

## 2016-06-29 LAB — IRON AND TIBC
%SAT: 46 % (ref 11–50)
IRON: 153 ug/dL (ref 45–160)
TIBC: 335 ug/dL (ref 250–450)
UIBC: 182 ug/dL (ref 125–400)

## 2016-06-29 LAB — URINALYSIS, ROUTINE W REFLEX MICROSCOPIC
Bilirubin Urine: NEGATIVE
Glucose, UA: NEGATIVE
Hgb urine dipstick: NEGATIVE
Ketones, ur: NEGATIVE
LEUKOCYTES UA: NEGATIVE
NITRITE: NEGATIVE
PH: 5.5 (ref 5.0–8.0)
Protein, ur: NEGATIVE
Specific Gravity, Urine: 1.009 (ref 1.001–1.035)

## 2016-06-29 LAB — LIPID PANEL
CHOL/HDL RATIO: 3.7 ratio (ref <5.0)
CHOLESTEROL: 246 mg/dL — AB (ref <200)
HDL: 66 mg/dL (ref >50)
LDL Cholesterol: 159 mg/dL — ABNORMAL HIGH (ref <100)
TRIGLYCERIDES: 107 mg/dL (ref <150)
VLDL: 21 mg/dL (ref <30)

## 2016-06-29 LAB — HEPATIC FUNCTION PANEL
ALT: 14 U/L (ref 6–29)
AST: 20 U/L (ref 10–35)
Albumin: 4.6 g/dL (ref 3.6–5.1)
Alkaline Phosphatase: 95 U/L (ref 33–130)
BILIRUBIN DIRECT: 0.1 mg/dL (ref <=0.2)
BILIRUBIN INDIRECT: 0.5 mg/dL (ref 0.2–1.2)
BILIRUBIN TOTAL: 0.6 mg/dL (ref 0.2–1.2)
Total Protein: 7.3 g/dL (ref 6.1–8.1)

## 2016-06-29 LAB — INSULIN, FASTING: Insulin fasting, serum: 3 u[IU]/mL (ref 2.0–19.6)

## 2016-06-29 LAB — VITAMIN D 25 HYDROXY (VIT D DEFICIENCY, FRACTURES): Vit D, 25-Hydroxy: 55 ng/mL (ref 30–100)

## 2016-06-29 LAB — MICROALBUMIN / CREATININE URINE RATIO
CREATININE, URINE: 73 mg/dL (ref 20–320)
MICROALB UR: 0.3 mg/dL
MICROALB/CREAT RATIO: 4 ug/mg{creat} (ref <30)

## 2016-06-29 LAB — FERRITIN: Ferritin: 97 ng/mL (ref 10–232)

## 2016-06-29 LAB — MAGNESIUM: Magnesium: 2.2 mg/dL (ref 1.5–2.5)

## 2016-07-04 LAB — CYTOLOGY - PAP
Diagnosis: NEGATIVE
HPV: NOT DETECTED

## 2016-07-11 ENCOUNTER — Encounter: Payer: Self-pay | Admitting: Physician Assistant

## 2016-10-12 ENCOUNTER — Telehealth: Payer: Self-pay

## 2016-10-12 NOTE — Telephone Encounter (Signed)
LVM for pt to return office call  Pt had questions about CPE 06/28/16 in addition to Dx for basal cell carcinoma   Per provider   *OV charge will be removed   [facility] Cell is from Dr Denna Haggard from a long time ago. If this is an error then this can possibly be removed from chart.

## 2016-11-10 ENCOUNTER — Ambulatory Visit (INDEPENDENT_AMBULATORY_CARE_PROVIDER_SITE_OTHER): Payer: Managed Care, Other (non HMO) | Admitting: Physician Assistant

## 2016-11-10 ENCOUNTER — Encounter: Payer: Self-pay | Admitting: Physician Assistant

## 2016-11-10 VITALS — BP 124/70 | HR 78 | Temp 97.7°F | Resp 14 | Ht 62.0 in | Wt 124.4 lb

## 2016-11-10 DIAGNOSIS — I10 Essential (primary) hypertension: Secondary | ICD-10-CM | POA: Diagnosis not present

## 2016-11-10 DIAGNOSIS — F411 Generalized anxiety disorder: Secondary | ICD-10-CM | POA: Diagnosis not present

## 2016-11-10 DIAGNOSIS — R292 Abnormal reflex: Secondary | ICD-10-CM | POA: Diagnosis not present

## 2016-11-10 DIAGNOSIS — E785 Hyperlipidemia, unspecified: Secondary | ICD-10-CM | POA: Diagnosis not present

## 2016-11-10 DIAGNOSIS — R413 Other amnesia: Secondary | ICD-10-CM | POA: Diagnosis not present

## 2016-11-10 DIAGNOSIS — Z79899 Other long term (current) drug therapy: Secondary | ICD-10-CM

## 2016-11-10 DIAGNOSIS — R42 Dizziness and giddiness: Secondary | ICD-10-CM

## 2016-11-10 DIAGNOSIS — R0989 Other specified symptoms and signs involving the circulatory and respiratory systems: Secondary | ICD-10-CM

## 2016-11-10 DIAGNOSIS — H5500 Unspecified nystagmus: Secondary | ICD-10-CM | POA: Diagnosis not present

## 2016-11-10 LAB — HEPATIC FUNCTION PANEL
ALK PHOS: 106 U/L (ref 33–130)
ALT: 15 U/L (ref 6–29)
AST: 19 U/L (ref 10–35)
Albumin: 4.4 g/dL (ref 3.6–5.1)
BILIRUBIN INDIRECT: 0.3 mg/dL (ref 0.2–1.2)
Bilirubin, Direct: 0.1 mg/dL (ref <=0.2)
TOTAL PROTEIN: 6.7 g/dL (ref 6.1–8.1)
Total Bilirubin: 0.4 mg/dL (ref 0.2–1.2)

## 2016-11-10 LAB — CBC WITH DIFFERENTIAL/PLATELET
BASOS PCT: 0 %
Basophils Absolute: 0 cells/uL (ref 0–200)
Eosinophils Absolute: 140 cells/uL (ref 15–500)
Eosinophils Relative: 2 %
HEMATOCRIT: 41.6 % (ref 35.0–45.0)
HEMOGLOBIN: 14.4 g/dL (ref 11.7–15.5)
LYMPHS ABS: 1750 {cells}/uL (ref 850–3900)
LYMPHS PCT: 25 %
MCH: 31.4 pg (ref 27.0–33.0)
MCHC: 34.6 g/dL (ref 32.0–36.0)
MCV: 90.6 fL (ref 80.0–100.0)
MPV: 10.5 fL (ref 7.5–12.5)
Monocytes Absolute: 420 cells/uL (ref 200–950)
Monocytes Relative: 6 %
NEUTROS ABS: 4690 {cells}/uL (ref 1500–7800)
Neutrophils Relative %: 67 %
Platelets: 240 10*3/uL (ref 140–400)
RBC: 4.59 MIL/uL (ref 3.80–5.10)
RDW: 13.4 % (ref 11.0–15.0)
WBC: 7 10*3/uL (ref 3.8–10.8)

## 2016-11-10 LAB — BASIC METABOLIC PANEL WITH GFR
BUN: 14 mg/dL (ref 7–25)
CHLORIDE: 106 mmol/L (ref 98–110)
CO2: 25 mmol/L (ref 20–31)
Calcium: 9.4 mg/dL (ref 8.6–10.4)
Creat: 0.78 mg/dL (ref 0.50–1.05)
GFR, EST NON AFRICAN AMERICAN: 85 mL/min (ref >=60)
GLUCOSE: 82 mg/dL (ref 65–99)
POTASSIUM: 4.3 mmol/L (ref 3.5–5.3)
Sodium: 141 mmol/L (ref 135–146)

## 2016-11-10 LAB — LIPID PANEL
CHOL/HDL RATIO: 3.7 ratio (ref <5.0)
Cholesterol: 239 mg/dL — ABNORMAL HIGH (ref <200)
HDL: 64 mg/dL (ref >50)
LDL CALC: 159 mg/dL — AB (ref <100)
Triglycerides: 78 mg/dL (ref <150)
VLDL: 16 mg/dL (ref <30)

## 2016-11-10 LAB — TSH: TSH: 1.22 m[IU]/L

## 2016-11-10 MED ORDER — ESCITALOPRAM OXALATE 10 MG PO TABS: 10.0000 mg | ORAL_TABLET | Freq: Every day | ORAL | 2 refills | Status: DC

## 2016-11-10 NOTE — Patient Instructions (Signed)
Your ears and sinuses are connected by the eustachian tube. When your sinuses are inflamed, this can close off the tube and cause fluid to collect in your middle ear. This can then cause dizziness, popping, clicking, ringing, and echoing in your ears. This is often NOT an infection and does NOT require antibiotics, it is caused by inflammation so the treatments help the inflammation. This can take a long time to get better so please be patient.  Here are things you can do to help with this: - Try the Flonase or Nasonex. Remember to spray each nostril twice towards the outer part of your eye.  Do not sniff but instead pinch your nose and tilt your head back to help the medicine get into your sinuses.  The best time to do this is at bedtime.Stop if you get blurred vision or nose bleeds.  -While drinking fluids, pinch and hold nose close and swallow, to help open eustachian tubes to drain fluid behind ear drums. -Please pick one of the over the counter allergy medications below and take it once daily for allergies.  It will also help with fluid behind ear drums. Claritin or loratadine cheapest but likely the weakest  Zyrtec or certizine at night because it can make you sleepy The strongest is allegra or fexafinadine  Cheapest at [facility], [employer], [employer] -can use decongestant over the counter, please do not use if you have high blood pressure or certain heart conditions.   if worsening HA, changes vision/speech, imbalance, weakness go to the ER    Major Depressive Disorder, Adult Major depressive disorder (MDD) is a mental health condition. It may also be called clinical depression or unipolar depression. MDD usually causes feelings of sadness, hopelessness, or helplessness. MDD can also cause physical symptoms. It can interfere with work, school, relationships, and other everyday activities. MDD may be mild, moderate, or severe. It may occur once (single episode major depressive disorder) or it may occur  multiple times (recurrent major depressive disorder). What are the causes? The exact cause of this condition is not known. MDD is most likely caused by a combination of things, which may include:  Genetic factors. These are traits that are passed along from parent to child.  Individual factors. Your personality, your behavior, and the way you handle your thoughts and feelings may contribute to MDD. This includes personality traits and behaviors learned from others.  Physical factors, such as:  Differences in the part of your brain that controls emotion. This part of your brain may be different than it is in people who do not have MDD.  Long-term (chronic) medical or psychiatric illnesses.  Social factors. Traumatic experiences or major life changes may play a role in the development of MDD. What increases the risk? This condition is more likely to develop in women. The following factors may also make you more likely to develop MDD:  A family history of depression.  Troubled family relationships.  Abnormally low levels of certain brain chemicals.  Traumatic events in childhood, especially abuse or the loss of a parent.  Being under a lot of stress, or long-term stress, especially from upsetting life experiences or losses.  A history of:  Chronic physical illness.  Other mental health disorders.  Substance abuse.  Poor living conditions.  Experiencing social exclusion or discrimination on a regular basis. What are the signs or symptoms? The main symptoms of MDD typically include:  Constant depressed or irritable mood.  Loss of interest in things and activities.  MDD symptoms may also include:  Sleeping or eating too much or too little.  Unexplained weight change.  Fatigue or low energy.  Feelings of worthlessness or guilt.  Difficulty thinking clearly or making decisions.  Thoughts of suicide or of harming others.  Physical agitation or  weakness.  Isolation. Severe cases of MDD may also occur with other symptoms, such as:  Delusions or hallucinations, in which you imagine things that are not real (psychotic depression).  Low-level depression that lasts at least a year (chronic depression or persistent depressive disorder).  Extreme sadness and hopelessness (melancholic depression).  Trouble speaking and moving (catatonic depression). How is this diagnosed? This condition may be diagnosed based on:  Your symptoms.  Your medical history, including your mental health history. This may involve tests to evaluate your mental health. You may be asked questions about your lifestyle, including any drug and alcohol use, and how long you have had symptoms of MDD.  A physical exam.  Blood tests to rule out other conditions. You must have a depressed mood and at least four other MDD symptoms most of the day, nearly every day in the same 2-week timeframe before your health care provider can confirm a diagnosis of MDD. How is this treated? This condition is usually treated by mental health professionals, such as psychologists, psychiatrists, and clinical social workers. You may need more than one type of treatment. Treatment may include:  Psychotherapy. This is also called talk therapy or counseling. Types of psychotherapy include:  Cognitive behavioral therapy (CBT). This type of therapy teaches you to recognize unhealthy feelings, thoughts, and behaviors, and replace them with positive thoughts and actions.  Interpersonal therapy (IPT). This helps you to improve the way you relate to and communicate with others.  Family therapy. This treatment includes members of your family.  Medicine to treat anxiety and depression, or to help you control certain emotions and behaviors.  Lifestyle changes, such as:  Limiting alcohol and drug use.  Exercising regularly.  Getting plenty of sleep.  Making healthy eating  choices.  Spending more time outdoors. Treatments involving stimulation of the brain can be used in situations with extremely severe symptoms, or when medicine or other therapies do not work over time. These treatments include electroconvulsive therapy, transcranial magnetic stimulation, and vagal nerve stimulation. Follow these instructions at home: Activity   Return to your normal activities as told by your health care provider.  Exercise regularly and spend time outdoors as told by your health care provider. General instructions   Take over-the-counter and prescription medicines only as told by your health care provider.  Do not drink alcohol. If you drink alcohol, limit your alcohol intake to no more than 1 drink a day for nonpregnant women and 2 drinks a day for men. One drink equals 12 oz of beer, 5 oz of wine, or 1 oz of hard liquor. Alcohol can affect any antidepressant medicines you are taking. Talk to your health care provider about your alcohol use.  Eat a healthy diet and get plenty of sleep.  Find activities that you enjoy doing, and make time to do them.  Consider joining a support group. Your health care provider may be able to recommend a support group.  Keep all follow-up visits as told by your health care provider. This is important. Where to find more information: [employer] on Mental Illness  www.nami.org [place] National Institute of Mental Health  https://carter.com/ National Suicide Prevention Lifeline  1-800-273-TALK (878)205-1836). This is free,  24-hour help. Contact a health care provider if:  Your symptoms get worse.  You develop new symptoms. Get help right away if:  You self-harm.  You have serious thoughts about hurting yourself or others.  You see, hear, taste, smell, or feel things that are not present (hallucinate). This information is not intended to replace advice given to you by your health care provider. Make sure you discuss any questions  you have with your health care provider. Document Released: 11/19/2012 Document Revised: 03/31/2016 Document Reviewed: 02/03/2016 Elsevier Interactive Patient Education  2017 [employer].

## 2016-11-10 NOTE — Progress Notes (Signed)
Assessment and Plan:   Hyperlipidemia, unspecified hyperlipidemia type -continue medications, check lipids, decrease fatty foods, increase activity.  -     Lipid panel  Generalized anxiety disorder -     TSH -     escitalopram (LEXAPRO) 10 MG tablet; Take 1 tablet (10 mg total) by mouth daily. 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 Further disposition pending results of labs. Over 30 minutes of exam, counseling, chart review, and critical decision making was performed Future Appointments Date Time Provider [place]  07/03/2017 9:00 AM Vicie Mutters, PA-C [id number] None    HPI 56 y.o. female  presents for 3 month follow up on hypertension, cholesterol, stress/memory issues, and vitamin D deficiency.   Her blood pressure has been controlled at home, today their BP is BP: 124/70  She does not workout. She denies chest pain, shortness of breath.   She has been having increased stress with her daughter and work and having trouble concentrating/word finding issues. Had to pull her daughter out of school due to possible alcoholism, husband not supportive possible functional alcoholic, son not doing well in college, parents went to ER. She states that she is feeling better with the adderall. Her kids are still causing stress at home but takes adderall at work an states able to focus better.   Has not felt well for last  week, has history of vertigo, has vertigo x 1 week, has had fuzzy vision/hazy vision, some nausea with headache. Has had some sinus congestion. No ringing in ears, no popping, clicking in her ears. She still complains of memory issues, will have random crying. Has had negative RF, CCP, antiDNA, Sed rate, Hep C, HIV, RPR, B12, iron/ferritin, TSH all normal.  Had normal CT 2013. Has appointment with eye doctor in may.    She is not on cholesterol medication and denies myalgias. Her cholesterol is not at goal. The cholesterol last visit was:   Lab Results  Component Value Date   CHOL 246 (H) 06/28/2016   HDL 66 06/28/2016   LDLCALC 159 (H) 06/28/2016   LDLDIRECT 151.2 01/03/2008   TRIG 107 06/28/2016   CHOLHDL 3.7 06/28/2016   Last A1C in the office was:  Lab Results  Component Value Date   HGBA1C 4.8 06/28/2016   Patient is on Vitamin D supplement.   Lab Results  Component Value Date   VD25OH 55 06/28/2016      Current Medications:  Current Outpatient Prescriptions on File Prior to Visit  Medication Sig Dispense Refill  . ALPRAZolam (XANAX) 0.5 MG tablet 1/2-1 pill as needed daily for anxiety 60 tablet 0  . amphetamine-dextroamphetamine (ADDERALL) 10 MG tablet Take 1 tablet (10 mg total) by mouth 2 (two) times daily with a meal. 60 tablet 0  . cetirizine (ZYRTEC) 10 MG tablet Take 10 mg by  mouth daily.    . Cholecalciferol (VITAMIN D3 MAXIMUM STRENGTH) 5000 UNITS capsule Take 5,000 Units by mouth daily.    . Cyanocobalamin (VITAMIN B 12 PO) Take by mouth daily.    . cycloSPORINE (RESTASIS) 0.05 % ophthalmic emulsion Place 1 drop into both eyes 2 (two) times daily.    . hyoscyamine (LEVSIN SL) 0.125 MG SL tablet     . nabumetone (RELAFEN) 500 MG tablet Take 1 tablet (500 mg total) by mouth 2 (two) times daily as needed (for pain). 90 tablet 1  . Omega-3 Fatty Acids (FISH OIL) 1200 MG CAPS Take 1,200 mg by mouth daily.    Marland Kitchen OVER THE COUNTER MEDICATION Turmeric daily    .  Phosphatidylserine-DHA-EPA (VAYACOG) 100-19.5-6.5 MG CAPS 2 a day 60 capsule 3  . promethazine (PHENERGAN) 25 MG tablet     . triamcinolone (NASACORT ALLERGY 24HR) 55 MCG/ACT AERO nasal inhaler Place 2 sprays into the nose daily.     No current facility-administered medications on file prior to visit.    Medical History:  Past Medical History:  Diagnosis Date  . 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 Seasonal allergies    Allergies:  Allergies  Allergen Reactions  . Erythromycin Nausea And Vomiting     Review of Systems:  Review of Systems  Constitutional: Negative.   HENT: Negative.   Eyes: Positive for blurred vision.  Respiratory: Negative.   Cardiovascular: Negative.   Gastrointestinal: Negative.   Genitourinary: Negative.   Musculoskeletal: Negative.   Skin: Negative.   Neurological: Positive for dizziness and headaches. Negative for tingling, tremors, sensory change, speech change, focal weakness, seizures and loss of consciousness.  Endo/Heme/Allergies: Negative.   Psychiatric/Behavioral: Positive for depression and memory loss. Negative for hallucinations, substance abuse and suicidal ideas. The patient is nervous/anxious. The patient does not have insomnia.     Family history- Review and unchanged Social history- Review and unchanged Physical Exam: BP 124/70   Pulse 78   Temp 97.7 F (36.5 C)   Resp 14   Ht 5\' 2"  (1.575 m)   Wt 124 lb 6.4 oz (56.4 kg)   SpO2 97%   BMI 22.75 kg/m  Wt Readings from Last 3 Encounters:  11/10/16 124 lb 6.4 oz (56.4 kg)  06/28/16 124 lb 12.8 oz (56.6 kg)  02/17/16 120 lb 6.4 oz (54.6 kg)   General Appearance: Well nourished, in no apparent distress. Eyes: PERRLA, EOMs, conjunctiva no swelling or erythema Sinuses: No Frontal/maxillary tenderness ENT/Mouth: Ext aud canals clear, TMs without erythema, bulging, + effusion bilateral ears. No erythema, swelling, or exudate on post pharynx.  Tonsils not swollen or erythematous. Hearing normal.   Neck: Supple, thyroid normal.  Respiratory: Respiratory effort normal, BS equal bilaterally without rales, rhonchi, wheezing or stridor.  Cardio: RRR with no MRGs. Brisk peripheral pulses without edema.  Abdomen: Soft, + BS,  Non tender, no guarding, rebound, hernias, masses. Lymphatics: Non tender without lymphadenopathy.  Musculoskeletal: Full ROM, 5/5 strength, Normal gait Skin: Warm, dry without rashes, lesions, ecchymosis.  Neuro: Cranial nerves intact. Normal muscle tone, no cerebellar symptoms., hyperreflexia, nystagmus to the left Psych: Awake and oriented X 3, normal affect, Insight and Judgment appropriate.    Vicie Mutters, PA-C 11:51 AM [facility]

## 2016-11-11 ENCOUNTER — Encounter: Payer: Self-pay | Admitting: Neurology

## 2016-11-16 ENCOUNTER — Ambulatory Visit: Payer: Self-pay | Admitting: Physician Assistant

## 2016-11-25 ENCOUNTER — Ambulatory Visit (INDEPENDENT_AMBULATORY_CARE_PROVIDER_SITE_OTHER): Payer: Managed Care, Other (non HMO) | Admitting: Neurology

## 2016-11-25 ENCOUNTER — Encounter: Payer: Self-pay | Admitting: Neurology

## 2016-11-25 VITALS — BP 122/76 | HR 72

## 2016-11-25 DIAGNOSIS — R42 Dizziness and giddiness: Secondary | ICD-10-CM

## 2016-11-25 DIAGNOSIS — F419 Anxiety disorder, unspecified: Secondary | ICD-10-CM | POA: Diagnosis not present

## 2016-11-25 DIAGNOSIS — R413 Other amnesia: Secondary | ICD-10-CM

## 2016-11-25 NOTE — Patient Instructions (Signed)
I don't think you have dementia, Alzheimer's or any other neurologic condition.  I think the stress is causing all of these symptoms.

## 2016-11-25 NOTE — Progress Notes (Addendum)
NEUROLOGY CONSULTATION NOTE  Amanda Salazar MRN: [id number] DOB: 12/16/1960  Referring provider: Vicie Mutters, PA-C Primary care provider: [name], MD  Reason for consult:  Memory problems, dizziness  HISTORY OF PRESENT ILLNESS: Amanda Salazar is a 56 year old right-handed female with hyperlipidemia, hypertension, and generalized anxiety disorder who presents for dizziness and memory deficits.  History supplemented by PCP note.  She reports history of significant anxiety all of her life. For over a year, she reports some difficulty with retaining information and short term memory.  For example, when she is driving and using a GPS, she cannot retain multi-directions and needs to take each direction one at a time.  She denies difficulty performing her job.  She started a new job in November and had to adjust, but she is doing well.  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  She has her bachelor's in Engineer, production and a master's in business administration.  In regards to family history, she states that both her maternal aunt and uncle had "mental problems" but was not able to elaborate.  She has significant anxiety and stress, some related to work and her daughter.  She reports that she is more emotional and cries easily.  She has been resistant to starting an antidepressant.  She had a couple of episodes of dizziness, which she describes as a lightheadedness.  It occurred spontaneously while sitting at her desk.  It was not positional.  She has history of vertigo and does not describe a spinning sensation.  It lasted only a couple of minutes.  She also reports episodes of blurred and hazy vision.  She saw her ophthalmologist and was found to have a problem with her retina.  Work up includes negative labs such as TSH 1.22, B12 1613, RPR nonreactive CT of head from 06/01/12 was personally reviewed and is unremarkable. She has an MRI of the brain from 11/14/16 was  normal, as per report.  PAST MEDICAL HISTORY: Past Medical History:  Diagnosis Date  . ADD (attention deficit disorder)   . Blurred vision   . 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 Seasonal allergies     PAST SURGICAL HISTORY: Past Surgical History:  Procedure Laterality Date  . BREAST SURGERY     biopsy x 3  . CESAREAN SECTION     x 4  . EYE SURGERY      MEDICATIONS: Current Outpatient Prescriptions on File Prior to Visit  Medication Sig Dispense Refill  . amphetamine-dextroamphetamine (ADDERALL) 10 MG tablet Take 1 tablet (10 mg total) by mouth 2 (two) times daily with a meal. 60 tablet 0  . cetirizine (ZYRTEC) 10 MG tablet Take 10 mg by mouth daily.    . Cholecalciferol (VITAMIN D3 MAXIMUM STRENGTH) 5000 UNITS capsule Take 5,000 Units by mouth daily.    . Cyanocobalamin (VITAMIN B 12 PO) Take by mouth daily.    . cycloSPORINE (RESTASIS) 0.05 % ophthalmic emulsion Place 1 drop into both eyes 2 (two) times daily.    . Omega-3 Fatty Acids (FISH OIL) 1200 MG CAPS Take 1,200 mg by mouth daily.    Marland Kitchen OVER THE COUNTER MEDICATION Turmeric daily    . Phosphatidylserine-DHA-EPA (VAYACOG) 100-19.5-6.5 MG CAPS 2 a day 60 capsule 3  . triamcinolone (NASACORT ALLERGY 24HR) 55 MCG/ACT AERO nasal inhaler Place 2 sprays into the nose daily.    Marland Kitchen ALPRAZolam (XANAX) 0.5 MG tablet 1/2-1 pill as needed daily for anxiety (Patient not taking:  Reported on 11/25/2016) 60 tablet 0   No current facility-administered medications on file prior to visit.     ALLERGIES: Allergies  Allergen Reactions  . Erythromycin Nausea And Vomiting    FAMILY HISTORY: Family History  Problem Relation Age of Onset  . Hypertension Mother     SOCIAL HISTORY: Social History   Social History  . Marital status: Married    Spouse name: N/A  . Number of children: N/A  . Years of education: N/A   Occupational History  . Not on file.   Social History Main Topics  . Smoking status: Never Smoker  . Smokeless tobacco:  Never Used  . Alcohol use Yes     Comment: glass wine most nights  . Drug use: No  . Sexual activity: Not on file   Other Topics Concern  . Not on file   Social History Narrative  . No narrative on file    REVIEW OF SYSTEMS: Constitutional: No fevers, chills, or sweats, no generalized fatigue, change in appetite Eyes: as above Ear, nose and throat: No hearing loss, ear pain, nasal congestion, sore throat Cardiovascular: No chest pain, palpitations Respiratory:  No shortness of breath at rest or with exertion, wheezes GastrointestinaI: No nausea, vomiting, diarrhea, abdominal pain, fecal incontinence Genitourinary:  No dysuria, urinary retention or frequency Musculoskeletal:  No neck pain, back pain Integumentary: No rash, pruritus, skin lesions Neurological: as above Psychiatric: depression, anxiety Endocrine: No palpitations, fatigue, diaphoresis, mood swings, change in appetite, change in weight, increased thirst Hematologic/Lymphatic:  No purpura, petechiae. Allergic/Immunologic: no itchy/runny eyes, nasal congestion, recent allergic reactions, rashes  PHYSICAL EXAM: Vitals:   11/25/16 1024  BP: 122/76  Pulse: 72   General: No acute distress.  Patient appears well-groomed.  Head:  Normocephalic/atraumatic Eyes:  fundi examined but not visualized Neck: supple, no paraspinal tenderness, full range of motion Back: No paraspinal tenderness Heart: regular rate and rhythm Lungs: Clear to auscultation bilaterally. Vascular: No carotid bruits. Neurological Exam: Mental status: alert and oriented to person, place, and time, delayed recall 2/5 and remote memory intact, fund of knowledge intact, attention and concentration intact, speech fluent and not dysarthric, language intact. Montreal Cognitive Assessment  11/25/2016  Visuospatial/ Executive (0/5) 5  Naming (0/3) 3  Attention: Read list of digits (0/2) 2  Attention: Read list of letters (0/1) 1  Attention: Serial 7  subtraction starting at 100 (0/3) 3  Language: Repeat phrase (0/2) 2  Language : Fluency (0/1) 1  Abstraction (0/2) 2  Delayed Recall (0/5) 2  Orientation (0/6) 6  Total 27  Adjusted Score (based on education) 27   Cranial nerves: CN I: not tested CN II: pupils equal, round and reactive to light, visual fields intact CN III, IV, VI:  full range of motion, no nystagmus, no ptosis CN V: facial sensation intact CN VII: upper and lower face symmetric CN VIII: hearing intact CN IX, X: gag intact, uvula midline CN XI: sternocleidomastoid and trapezius muscles intact CN XII: tongue midline Bulk & Tone: normal, no fasciculations. Motor:  5/5 throughout  Sensation:  Pinprick and vibration sensation intact. Deep Tendon Reflexes:  2+ throughout except 3+ but symmetric in patellars, toes downgoing.  Finger to nose testing:  Without dysmetria.  Heel to shin:  Without dysmetria.  Gait:  Normal station and stride.  Able to turn and tandem walk. Romberg negative.  IMPRESSION: Memory deficits Episodic dizziness  I don't appreciate an neurologic etiology for her symptoms.  Neurologic exam is normal.  MoCA score within normal limits.  MRI of brain was normal.  I think her symptoms are related to her significant anxiety.  PLAN: Recommend focusing treatment on management of her anxiety  Thank you for allowing me to take part in the care of this patient.  Metta Clines, DO  CC:  Vicie Mutters, PA-C  [name], MD

## 2016-12-02 ENCOUNTER — Encounter (HOSPITAL_COMMUNITY): Payer: Self-pay | Admitting: Emergency Medicine

## 2016-12-02 ENCOUNTER — Encounter (INDEPENDENT_AMBULATORY_CARE_PROVIDER_SITE_OTHER): Payer: Managed Care, Other (non HMO) | Admitting: Ophthalmology

## 2016-12-02 ENCOUNTER — Emergency Department (HOSPITAL_COMMUNITY)
Admission: EM | Admit: 2016-12-02 | Discharge: 2016-12-02 | Disposition: A | Discharge status: Home / Self Care | Payer: Managed Care, Other (non HMO) | Attending: Emergency Medicine | Admitting: Emergency Medicine

## 2016-12-02 DIAGNOSIS — R55 Syncope and collapse: Secondary | ICD-10-CM | POA: Insufficient documentation

## 2016-12-02 DIAGNOSIS — H43813 Vitreous degeneration, bilateral: Secondary | ICD-10-CM

## 2016-12-02 DIAGNOSIS — Z85828 Personal history of other malignant neoplasm of skin: Secondary | ICD-10-CM | POA: Insufficient documentation

## 2016-12-02 DIAGNOSIS — H33301 Unspecified retinal break, right eye: Secondary | ICD-10-CM

## 2016-12-02 DIAGNOSIS — H35371 Puckering of macula, right eye: Secondary | ICD-10-CM | POA: Diagnosis not present

## 2016-12-02 DIAGNOSIS — Z79899 Other long term (current) drug therapy: Secondary | ICD-10-CM | POA: Diagnosis not present

## 2016-12-02 LAB — URINALYSIS, ROUTINE W REFLEX MICROSCOPIC
Bacteria, UA: NONE SEEN
Bilirubin Urine: NEGATIVE
Glucose, UA: NEGATIVE mg/dL
HGB URINE DIPSTICK: NEGATIVE
Ketones, ur: NEGATIVE mg/dL
Nitrite: NEGATIVE
Protein, ur: NEGATIVE mg/dL
SPECIFIC GRAVITY, URINE: 1.006 (ref 1.005–1.030)
pH: 7 (ref 5.0–8.0)

## 2016-12-02 LAB — BASIC METABOLIC PANEL
ANION GAP: 11 (ref 5–15)
BUN: 11 mg/dL (ref 6–20)
CALCIUM: 9.5 mg/dL (ref 8.9–10.3)
CHLORIDE: 106 mmol/L (ref 101–111)
CO2: 23 mmol/L (ref 22–32)
CREATININE: 0.74 mg/dL (ref 0.44–1.00)
GFR calc non Af Amer: >60 mL/min (ref >60)
GLUCOSE: 89 mg/dL (ref 65–99)
POTASSIUM: 4.7 mmol/L (ref 3.5–5.1)
SODIUM: 140 mmol/L (ref 135–145)

## 2016-12-02 LAB — CBC
HCT: 41.1 % (ref 36.0–46.0)
Hemoglobin: 14.3 g/dL (ref 12.0–15.0)
MCH: 30.8 pg (ref 26.0–34.0)
MCHC: 34.8 g/dL (ref 30.0–36.0)
MCV: 88.6 fL (ref 78.0–100.0)
PLATELETS: 215 10*3/uL (ref 150–400)
RBC: 4.64 MIL/uL (ref 3.87–5.11)
RDW: 12.8 % (ref 11.5–15.5)
WBC: 4.5 10*3/uL (ref 4.0–10.5)

## 2016-12-02 LAB — CBG MONITORING, ED: GLUCOSE-CAPILLARY: 85 mg/dL (ref 65–99)

## 2016-12-02 MED ORDER — METHYLPREDNISOLONE SODIUM SUCC 125 MG IJ SOLR
125.0000 mg | Freq: Once | INTRAMUSCULAR | Status: AC
  Administered 2016-12-02: 125 mg via INTRAVENOUS
  Filled 2016-12-02: qty 2

## 2016-12-02 MED ORDER — DIPHENHYDRAMINE HCL 50 MG/ML IJ SOLN
25.0000 mg | Freq: Once | INTRAMUSCULAR | Status: AC
  Administered 2016-12-02: 25 mg via INTRAVENOUS
  Filled 2016-12-02: qty 1

## 2016-12-02 NOTE — ED Provider Notes (Signed)
[facility] DEPT Provider Note   CSN: [id number] Arrival date & time: 12/02/16  1107     History   Chief Complaint Chief Complaint  Patient presents with  . Loss of Consciousness    HPI Amanda Salazar is a 56 y.o. female. CC:  Loss of consciousness  HPI:  Patient presents for evaluation after a syncopal episode at her eye doctor's office. Patient has some recent vision changes. Had an abnormal retinal evaluation by her ophthalmologist. Was referred to a retinologist for evaluation of "my retinal membranes". Was undergoing a contrast enhanced retinal scan today. That she gets infusion she began to feel warm. She felt nauseated. She felt lightheaded area and she had near syncope. She was pale and diaphoretic. She did not have chest pain or palpitations.  She was laid supine on the floor. Her symptoms improved. Heart rate in the 50s initially blood pressure of 60. This slowly improved. Arrives here asymptomatic other than a mild scratchiness in her throat. Did not have hives or rash.  Patient has history of what sounds like vasovagal vagal episodes in the past. She states one time even looking at an x-ray she gave became lightheaded and queasy and had to lay her head down on the table.    Past Medical History:  Diagnosis Date  . ADD (attention deficit disorder)   . Blurred vision   . 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 Seasonal allergies     Patient Active Problem List   Diagnosis Date Noted  . ADD (attention deficit disorder) 12/21/2015  . Generalized anxiety disorder 04/30/2015  . Dense breast tissue 04/29/2014  . Basal cell carcinoma 04/29/2014  . Hyperlipidemia 01/08/2008  . GERD 01/08/2008  . Allergic rhinitis 08/20/2007    Past Surgical History:  Procedure Laterality Date  . BREAST SURGERY     biopsy x 3  . CESAREAN SECTION     x 4  . EYE SURGERY      OB History    No data available       Home Medications    Prior to Admission medications   Medication Sig  Start Date End Date Taking? Authorizing Provider  ALPRAZolam Duanne Moron) 0.5 MG tablet 1/2-1 pill as needed daily for anxiety Patient not taking: Reported on 11/25/2016 08/05/15   Vicie Mutters, PA-C  amphetamine-dextroamphetamine (ADDERALL) 10 MG tablet Take 1 tablet (10 mg total) by mouth 2 (two) times daily with a meal. 06/02/16 06/02/17  Vicie Mutters, PA-C  cetirizine (ZYRTEC) 10 MG tablet Take 10 mg by mouth daily.    Historical Provider, MD  Cholecalciferol (VITAMIN D3 MAXIMUM STRENGTH) 5000 UNITS capsule Take 5,000 Units by mouth daily.    Historical Provider, MD  Cyanocobalamin (VITAMIN B 12 PO) Take by mouth daily.    Historical Provider, MD  cycloSPORINE (RESTASIS) 0.05 % ophthalmic emulsion Place 1 drop into both eyes 2 (two) times daily.    Historical Provider, MD  Omega-3 Fatty Acids (FISH OIL) 1200 MG CAPS Take 1,200 mg by mouth daily.    Historical Provider, MD  OVER THE COUNTER MEDICATION Turmeric daily    Historical Provider, MD  Phosphatidylserine-DHA-EPA (VAYACOG) 100-19.5-6.5 MG CAPS 2 a day 08/14/15   Vicie Mutters, PA-C  triamcinolone (NASACORT ALLERGY 24HR) 55 MCG/ACT AERO nasal inhaler Place 2 sprays into the nose daily.    Historical Provider, MD    Family History Family History  Problem Relation Age of Onset  . Hypertension Mother     Social History Social  History  Substance Use Topics  . Smoking status: Never Smoker  . Smokeless tobacco: Never Used  . Alcohol use Yes     Comment: glass wine most nights     Allergies   Erythromycin   Review of Systems Review of Systems  Constitutional: Negative for appetite change, chills, diaphoresis, fatigue and fever.  HENT: Negative for mouth sores, sore throat and trouble swallowing.        "Scratchy throat"  Eyes: Negative for visual disturbance.  Respiratory: Negative for cough, chest tightness, shortness of breath and wheezing.   Cardiovascular: Negative for chest pain.  Gastrointestinal: Positive for nausea.  Negative for abdominal distention, abdominal pain, diarrhea and vomiting.  Endocrine: Negative for polydipsia, polyphagia and polyuria.  Genitourinary: Negative for dysuria, frequency and hematuria.  Musculoskeletal: Negative for gait problem.  Skin: Negative for color change, pallor and rash.  Neurological: Positive for syncope. Negative for dizziness, light-headedness and headaches.  Hematological: Does not bruise/bleed easily.  Psychiatric/Behavioral: Negative for behavioral problems and confusion.     Physical Exam Updated Vital Signs BP 104/66   Pulse 72   Temp 98.5 F (36.9 C) (Oral)   Resp 17   Ht 5\' 2"  (1.575 m)   Wt 124 lb (56.2 kg)   LMP 05/11/2012   SpO2 96%   BMI 22.68 kg/m   Physical Exam  Constitutional: She is oriented to person, place, and time. She appears well-developed and well-nourished. No distress.  HENT:  Head: Normocephalic.  Eyes: Conjunctivae are normal. Pupils are equal, round, and reactive to light. No scleral icterus.  Neck: Normal range of motion. Neck supple. No thyromegaly present.  Cardiovascular: Normal rate and regular rhythm.  Exam reveals no gallop and no friction rub.   No murmur heard. Pulmonary/Chest: Effort normal and breath sounds normal. No respiratory distress. She has no wheezes. She has no rales.  Abdominal: Soft. Bowel sounds are normal. She exhibits no distension. There is no tenderness. There is no rebound.  Musculoskeletal: Normal range of motion.  Neurological: She is alert and oriented to person, place, and time.  Skin: Skin is warm and dry. No rash noted.  Psychiatric: She has a normal mood and affect. Her behavior is normal.   awake and alert. No rash. Not tachycardic. Normal pharynx. Clear lungs. Sinus rhythm.   ED Treatments / Results  Labs (all labs ordered are listed, but only abnormal results are displayed) Labs Reviewed  URINALYSIS, ROUTINE W REFLEX MICROSCOPIC - Abnormal; Notable for the following:        Result Value   Color, Urine YELLOW (*)    Leukocytes, UA TRACE (*)    Squamous Epithelial / LPF 0-5 (*)    All other components within normal limits  BASIC METABOLIC PANEL  CBC  CBG MONITORING, ED    EKG  EKG Interpretation None       Radiology No results found.  Procedures Procedures (including critical care time)  Medications Ordered in ED Medications  diphenhydrAMINE (BENADRYL) injection 25 mg (25 mg Intravenous Given 12/02/16 1226)  methylPREDNISolone sodium succinate (SOLU-MEDROL) 125 mg/2 mL injection 125 mg (125 mg Intravenous Given 12/02/16 1229)     Initial Impression / Assessment and Plan / ED Course  I have reviewed the triage vital signs and the nursing notes.  Pertinent labs & imaging results that were available during my care of the patient were reviewed by me and considered in my medical decision making (see chart for details).    With her sensation of  the scratchy throat I gave Benadryl and Solu-Medrol and asked her to wait for reevaluation. She was observed for an hour and a half and reexamined 2. No stigma of allergic reaction. No swelling of the toe oropharynx subpectoral objectively. Clear lungs. No hives. Stable blood pressures. I'm convinced this was a vasovagal reaction. She had a stimulus work. She had classic symptoms that resolved spontaneously after bradycardia and hypotension. Appropriate for discharge home.  Final Clinical Impressions(s) / ED Diagnoses   Final diagnoses:  Vasovagal episode    New Prescriptions New Prescriptions   No medications on file     Tanna Furry, MD 12/02/16 1323

## 2016-12-02 NOTE — ED Notes (Signed)
Papers reviewed with patient and her family. Patient verbalizes intent to follow up and reports no pain

## 2016-12-02 NOTE — ED Triage Notes (Signed)
Pt arrives from retinal specialist office via [facility] reporting syncopal episode after receiving IV dye.  EMS reports office staff reports initial BP 60 systolic, EMS reports pt normotensive en route.  Pt denies CP, SOB leading up to event. AOx4 at this time, NAD noted.

## 2016-12-05 ENCOUNTER — Encounter (INDEPENDENT_AMBULATORY_CARE_PROVIDER_SITE_OTHER): Payer: BLUE CROSS/BLUE SHIELD | Admitting: Ophthalmology

## 2016-12-08 ENCOUNTER — Encounter (INDEPENDENT_AMBULATORY_CARE_PROVIDER_SITE_OTHER): Payer: BLUE CROSS/BLUE SHIELD | Admitting: Ophthalmology

## 2016-12-09 ENCOUNTER — Encounter (INDEPENDENT_AMBULATORY_CARE_PROVIDER_SITE_OTHER): Payer: Managed Care, Other (non HMO) | Admitting: Ophthalmology

## 2017-01-24 ENCOUNTER — Ambulatory Visit: Payer: Managed Care, Other (non HMO) | Admitting: Neurology

## 2017-02-22 NOTE — Progress Notes (Deleted)
Assessment and Plan:   Hyperlipidemia, unspecified hyperlipidemia type -continue medications, check lipids, decrease fatty foods, increase activity.  -     Lipid panel  Generalized anxiety disorder -     TSH -     escitalopram (LEXAPRO) 10 MG tablet; Take 1 tablet (10 mg total) by mouth daily. 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 Further disposition pending results of labs. Over 30 minutes of exam, counseling, chart review, and critical decision making was performed Future Appointments Date Time Provider [place]  02/23/2017 4:30 PM Vicie Mutters, PA-C [facility] None  07/03/2017 9:00 AM Vicie Mutters, PA-C [facility] None    HPI 56 y.o. female  presents for 3 month follow up on hypertension, cholesterol, stress/memory issues, and vitamin D deficiency.   Her blood pressure has been controlled at home, today their BP is    She does not workout. She denies chest pain, shortness of breath.   Patient is following with Dr. Tomi Likens for memory issues and vertigo found to have everything normal, other than still following with eye doctor. She had a  Has had negative RF, CCP, antiDNA, Sed rate, Hep C, HIV, RPR, B12, iron/ferritin, TSH all normal.  Had normal CT 2013. Has seen her eye doctor. Had had vasovagal episode in April with Iv dye contrast.    She is not on cholesterol medication and denies myalgias. Her  cholesterol is not at goal. The cholesterol last visit was:   Lab Results  Component Value Date   CHOL 239 (H) 11/10/2016   HDL 64 11/10/2016   LDLCALC 159 (H) 11/10/2016   LDLDIRECT 151.2 01/03/2008   TRIG 78 11/10/2016   CHOLHDL 3.7 11/10/2016   Last A1C in the office was:  Lab Results  Component Value Date   HGBA1C 4.8 06/28/2016   Patient is on Vitamin D supplement.   Lab Results  Component Value Date   VD25OH 55 06/28/2016      Current Medications:  Current Outpatient Prescriptions on File Prior to Visit  Medication Sig Dispense Refill  . ALPRAZolam (XANAX) 0.5 MG tablet 1/2-1 pill as needed daily for anxiety (Patient not taking: Reported on 11/25/2016) 60 tablet 0  . amphetamine-dextroamphetamine (ADDERALL) 10 MG tablet Take 1 tablet (10 mg total) by mouth 2 (two) times daily with a meal. 60 tablet 0  . cetirizine (ZYRTEC) 10 MG tablet Take 10 mg by mouth daily.    . Cholecalciferol (VITAMIN D3 MAXIMUM STRENGTH) 5000 UNITS capsule Take 5,000 Units by mouth daily.    . Cyanocobalamin (VITAMIN B 12 PO) Take by mouth daily.    . cycloSPORINE (RESTASIS) 0.05 % ophthalmic emulsion Place 1 drop into both eyes 2 (two) times daily.    . Omega-3 Fatty Acids (FISH OIL) 1200 MG CAPS Take 1,200 mg by mouth daily.    Marland Kitchen OVER  THE COUNTER MEDICATION Turmeric daily    . Phosphatidylserine-DHA-EPA (VAYACOG) 100-19.5-6.5 MG CAPS 2 a day 60 capsule 3  . triamcinolone (NASACORT ALLERGY 24HR) 55 MCG/ACT AERO nasal inhaler Place 2 sprays into the nose daily.     No current facility-administered medications on file prior to visit.    Medical History:  Past Medical History:  Diagnosis Date  . ADD (attention deficit disorder)   . Blurred vision   . 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 Seasonal allergies    Allergies:  Allergies  Allergen Reactions  . Erythromycin Nausea And Vomiting     Review of Systems:  Review of Systems  Constitutional: Negative.   HENT: Negative.   Eyes: Positive for  blurred vision.  Respiratory: Negative.   Cardiovascular: Negative.   Gastrointestinal: Negative.   Genitourinary: Negative.   Musculoskeletal: Negative.   Skin: Negative.   Neurological: Positive for dizziness. Negative for tingling, tremors, sensory change, speech change, focal weakness, seizures and loss of consciousness.  Endo/Heme/Allergies: Negative.   Psychiatric/Behavioral: Positive for depression and memory loss. Negative for hallucinations, substance abuse and suicidal ideas. The patient is nervous/anxious. The patient does not have insomnia.     Family history- Review and unchanged Social history- Review and unchanged Physical Exam: LMP 05/11/2012  Wt Readings from Last 3 Encounters:  12/02/16 124 lb (56.2 kg)  11/10/16 124 lb 6.4 oz (56.4 kg)  06/28/16 124 lb 12.8 oz (56.6 kg)   General Appearance: Well nourished, in no apparent distress. Eyes: PERRLA, EOMs, conjunctiva no swelling or erythema Sinuses: No Frontal/maxillary tenderness ENT/Mouth: Ext aud canals clear, TMs without erythema, bulging, + effusion bilateral ears. No erythema, swelling, or exudate on post pharynx.  Tonsils not swollen or erythematous. Hearing normal.  Neck: Supple, thyroid normal.  Respiratory: Respiratory effort normal, BS equal bilaterally without rales, rhonchi, wheezing or stridor.  Cardio: RRR with no MRGs. Brisk peripheral pulses without edema.  Abdomen: Soft, + BS,  Non tender, no guarding, rebound, hernias, masses. Lymphatics: Non tender without lymphadenopathy.  Musculoskeletal: Full ROM, 5/5 strength, Normal gait Skin: Warm, dry without rashes, lesions, ecchymosis.  Neuro: Cranial nerves intact. Normal muscle tone, no cerebellar symptoms., hyperreflexia, nystagmus to the left Psych: Awake and oriented X 3, normal affect, Insight and Judgment appropriate.    Vicie Mutters, PA-C 12:35 PM [facility]

## 2017-02-23 ENCOUNTER — Ambulatory Visit: Payer: Self-pay | Admitting: Physician Assistant

## 2017-03-16 NOTE — Progress Notes (Signed)
Assessment and Plan:   Hyperlipidemia, unspecified hyperlipidemia type -continue medications, check lipids, decrease fatty foods, increase activity.  -     Lipid panel  Generalized anxiety disorder -     escitalopram (LEXAPRO) 10 MG tablet; Take 1 tablet (10 mg total) by mouth daily. - suggest counseling  Labile hypertension -     CBC with Differential/Platelet -     BASIC METABOLIC PANEL WITH GFR -     Hepatic function panel  Medication management -     CBC with Differential/Platelet -     BASIC METABOLIC PANEL WITH GFR -     Hepatic function panel  Vaginal discharge -     WET PREP BY MOLECULAR PROBE -     clotrimazole-betamethasone (LOTRISONE) cream; Apply 1 application topically 2 (two) times daily.   Continue diet and meds as discussed. Further disposition pending results of labs. Over 30 minutes of exam, counseling, chart review, and critical decision making was performed Future Appointments Date Time Provider [place]  03/17/2017 11:15 AM Vicie Mutters, PA-C [facility] None  07/03/2017 9:00 AM Vicie Mutters, PA-C [facility] None    HPI 56 y.o. female  presents for 3 month follow up on hypertension, cholesterol, stress/memory issues, and vitamin D deficiency.   Her blood pressure has been controlled at home, today their BP is    She does not workout. She denies chest pain, shortness of breath.   Patient is following with Dr. Tomi Likens for memory issues and vertigo found to have everything normal, other than still following with eye doctor. She has had negative RF, CCP, antiDNA, Sed rate, Hep C, HIV, RPR, B12, iron/ferritin, TSH all normal.  Had normal CT 2013. H Had had vasovagal episode in April with Iv dye contrast.    She is not on cholesterol medication and denies myalgias. Her cholesterol is not at goal. The cholesterol last visit was:   Lab Results  Component Value Date   CHOL 239 (H) 11/10/2016   HDL 64 11/10/2016   LDLCALC 159 (H) 11/10/2016    LDLDIRECT 151.2 01/03/2008   TRIG 78 11/10/2016   CHOLHDL 3.7 11/10/2016   Last A1C in the office was:  Lab Results  Component Value Date   HGBA1C 4.8 06/28/2016   Patient is on Vitamin D supplement.   Lab Results  Component Value Date   VD25OH 58 06/28/2016     She has had yellow discharge, mild irritation, not having sex, no new partners.  Normal PAP 2017.   Current Medications:  Current Outpatient Prescriptions on File Prior to Visit  Medication Sig Dispense Refill  . ALPRAZolam (XANAX) 0.5 MG tablet 1/2-1 pill as needed daily for anxiety (Patient not taking: Reported on 11/25/2016) 60 tablet 0  . amphetamine-dextroamphetamine (ADDERALL) 10 MG tablet Take 1 tablet (10 mg total) by mouth 2 (two) times daily with a meal. 60 tablet 0  . cetirizine (ZYRTEC) 10 MG tablet Take 10 mg by mouth daily.    . Cholecalciferol (VITAMIN D3 MAXIMUM STRENGTH) 5000 UNITS capsule Take 5,000 Units by mouth daily.    . Cyanocobalamin (VITAMIN B 12 PO) Take by mouth daily.    . cycloSPORINE (RESTASIS) 0.05 % ophthalmic emulsion Place 1 drop into both eyes 2 (two) times daily.    . Omega-3 Fatty Acids (FISH OIL) 1200 MG CAPS Take 1,200 mg by mouth daily.    Marland Kitchen OVER THE COUNTER MEDICATION Turmeric daily    . Phosphatidylserine-DHA-EPA (VAYACOG) 100-19.5-6.5 MG CAPS 2 a day 60 capsule  3  . triamcinolone (NASACORT ALLERGY 24HR) 55 MCG/ACT AERO nasal inhaler Place 2 sprays into the nose daily.     No current facility-administered medications on file prior to visit.    Medical History:  Past Medical History:  Diagnosis Date  . ADD (attention deficit disorder)   . Blurred vision   . 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 Seasonal allergies    Allergies:  Allergies  Allergen Reactions  . Erythromycin Nausea And Vomiting     Review of Systems:  Review of Systems  Constitutional: Negative.   HENT: Negative.   Eyes: Negative for blurred vision.  Respiratory: Negative.   Cardiovascular: Negative.    Gastrointestinal: Negative.   Genitourinary: Negative.        Vaginal itching/discharge  Musculoskeletal: Negative.   Skin: Negative.   Neurological: Negative for tingling, tremors, sensory change, speech change, focal weakness, seizures and loss of consciousness.  Endo/Heme/Allergies: Negative.   Psychiatric/Behavioral: Negative for depression, hallucinations, memory loss, substance abuse and suicidal ideas. The patient is nervous/anxious. The patient does not have insomnia.     Family history- Review and unchanged Social history- Review and unchanged Physical Exam: LMP 05/11/2012  Wt Readings from Last 3 Encounters:  12/02/16 124 lb (56.2 kg)  11/10/16 124 lb 6.4 oz (56.4 kg)  06/28/16 124 lb 12.8 oz (56.6 kg)   General Appearance: Well nourished, in no apparent distress. Eyes: PERRLA, EOMs, conjunctiva no swelling or erythema Sinuses: No Frontal/maxillary tenderness ENT/Mouth: Ext aud canals clear, TMs without erythema, bulging. No erythema, swelling, or exudate on post pharynx.  Tonsils not swollen or erythematous. Hearing normal.  Neck: Supple, thyroid normal.  Respiratory: Respiratory effort normal, BS equal bilaterally without rales, rhonchi, wheezing or stridor.  Cardio: RRR with no MRGs. Brisk peripheral pulses without edema.  Abdomen: Soft, + BS,  Non tender, no guarding, rebound, hernias, masses. Lymphatics: Non tender without lymphadenopathy.  Musculoskeletal: Full ROM, 5/5 strength, Normal gait Skin: Warm, dry without rashes, lesions, ecchymosis.  Neuro: Cranial nerves intact. Normal muscle tone, no cerebellar symptoms. Psych: Awake and oriented X 3, normal affect, Insight and Judgment appropriate.  QXI:HWTUU: vulvar erythema around opening, vulvar hypopigmentation and dryness VAGINA: atrophic, vaginal discharge - clear and scant.   Vicie Mutters, PA-C 2:08 PM [facility]

## 2017-03-17 ENCOUNTER — Encounter: Payer: Self-pay | Admitting: Physician Assistant

## 2017-03-17 ENCOUNTER — Ambulatory Visit (INDEPENDENT_AMBULATORY_CARE_PROVIDER_SITE_OTHER): Payer: Managed Care, Other (non HMO) | Admitting: Physician Assistant

## 2017-03-17 VITALS — BP 124/72 | HR 90 | Temp 97.7°F | Resp 14 | Ht 62.0 in | Wt 118.0 lb

## 2017-03-17 DIAGNOSIS — K219 Gastro-esophageal reflux disease without esophagitis: Secondary | ICD-10-CM | POA: Diagnosis not present

## 2017-03-17 DIAGNOSIS — F411 Generalized anxiety disorder: Secondary | ICD-10-CM | POA: Diagnosis not present

## 2017-03-17 DIAGNOSIS — F988 Other specified behavioral and emotional disorders with onset usually occurring in childhood and adolescence: Secondary | ICD-10-CM

## 2017-03-17 DIAGNOSIS — E785 Hyperlipidemia, unspecified: Secondary | ICD-10-CM | POA: Diagnosis not present

## 2017-03-17 DIAGNOSIS — N898 Other specified noninflammatory disorders of vagina: Secondary | ICD-10-CM

## 2017-03-17 LAB — HEPATIC FUNCTION PANEL
ALK PHOS: 103 U/L (ref 33–130)
ALT: 13 U/L (ref 6–29)
AST: 18 U/L (ref 10–35)
Albumin: 4.5 g/dL (ref 3.6–5.1)
Bilirubin, Direct: 0.1 mg/dL (ref <=0.2)
Indirect Bilirubin: 0.4 mg/dL (ref 0.2–1.2)
TOTAL PROTEIN: 7 g/dL (ref 6.1–8.1)
Total Bilirubin: 0.5 mg/dL (ref 0.2–1.2)

## 2017-03-17 LAB — LIPID PANEL
CHOL/HDL RATIO: 3.9 ratio (ref <5.0)
CHOLESTEROL: 242 mg/dL — AB (ref <200)
HDL: 62 mg/dL (ref >50)
LDL CALC: 153 mg/dL — AB (ref <100)
Triglycerides: 133 mg/dL (ref <150)
VLDL: 27 mg/dL (ref <30)

## 2017-03-17 LAB — CBC WITH DIFFERENTIAL/PLATELET
BASOS ABS: 0 {cells}/uL (ref 0–200)
Basophils Relative: 0 %
Eosinophils Absolute: 118 cells/uL (ref 15–500)
Eosinophils Relative: 2 %
HEMATOCRIT: 42.4 % (ref 35.0–45.0)
HEMOGLOBIN: 14.5 g/dL (ref 11.7–15.5)
LYMPHS ABS: 2006 {cells}/uL (ref 850–3900)
Lymphocytes Relative: 34 %
MCH: 31.5 pg (ref 27.0–33.0)
MCHC: 34.2 g/dL (ref 32.0–36.0)
MCV: 92.2 fL (ref 80.0–100.0)
MONO ABS: 413 {cells}/uL (ref 200–950)
MPV: 10.6 fL (ref 7.5–12.5)
Monocytes Relative: 7 %
NEUTROS ABS: 3363 {cells}/uL (ref 1500–7800)
NEUTROS PCT: 57 %
Platelets: 257 10*3/uL (ref 140–400)
RBC: 4.6 MIL/uL (ref 3.80–5.10)
RDW: 13.4 % (ref 11.0–15.0)
WBC: 5.9 10*3/uL (ref 3.8–10.8)

## 2017-03-17 LAB — BASIC METABOLIC PANEL WITH GFR
BUN: 10 mg/dL (ref 7–25)
CALCIUM: 9.6 mg/dL (ref 8.6–10.4)
CHLORIDE: 103 mmol/L (ref 98–110)
CO2: 26 mmol/L (ref 20–32)
Creat: 0.66 mg/dL (ref 0.50–1.05)
GFR, Est African American: >89 mL/min (ref >=60)
GLUCOSE: 79 mg/dL (ref 65–99)
Potassium: 4.1 mmol/L (ref 3.5–5.3)
Sodium: 140 mmol/L (ref 135–146)

## 2017-03-17 LAB — TSH: TSH: 1.2 mIU/L

## 2017-03-17 MED ORDER — ESCITALOPRAM OXALATE 10 MG PO TABS: 10.0000 mg | ORAL_TABLET | Freq: Every day | ORAL | 2 refills | Status: DC

## 2017-03-17 MED ORDER — CLOTRIMAZOLE-BETAMETHASONE 1-0.05 % EX CREA: 1.0000 "application " | TOPICAL_CREAM | Freq: Two times a day (BID) | CUTANEOUS | 2 refills | Status: DC

## 2017-03-17 MED ORDER — AMPHETAMINE-DEXTROAMPHETAMINE 10 MG PO TABS: 10.0000 mg | ORAL_TABLET | Freq: Two times a day (BID) | ORAL | 0 refills | Status: DC

## 2017-03-17 NOTE — Patient Instructions (Addendum)
Try lexapro 10mg  start 1/2 a day for 2-4 weeks but I really encourage you to go up to 10mg  a day, this is a low dose and when things are getting better we can just stop it  I love counseling specifically CBT, cognitive behavior therapy Can youtube or look up  VAGINAL DRYNESS OVERVIEW  Vaginal dryness, also known as atrophic vaginitis, is a common condition in postmenopausal women. This condition is also common in women who have had both ovaries removed at the time of hysterectomy.   Some women have uncomfortable symptoms of vaginal dryness, such as pain with sex, burning vaginal discomfort or itching, or abnormal vaginal discharge, while others have no symptoms at all.  VAGINAL DRYNESS CAUSES   Estrogen helps to keep the vagina moist and to maintain thickness of the vaginal lining. Vaginal dryness occurs when the ovaries produce a decreased amount of estrogen. This can occur at certain times in a woman's life, and may be permanent or temporary. Times when less estrogen is made include: ?At the time of menopause. ?After surgical removal of the ovaries, chemotherapy, or radiation therapy of the pelvis for cancer. ?After having a baby, particularly in women who breastfeed. ?While using certain medications, such as danazol, medroxyprogesterone (brand names: Provera or DepoProvera), leuprolide (brand name: Lupron), or nafarelin. When these medications are stopped, estrogen production resumes.  Women who smoke cigarettes have been shown to have an increased risk of an earlier menopause transition as compared to non-smokers. Therefore, atrophic vaginitis symptoms may appear at a younger age in this population.  VAGINAL DRYNESS TREATMENT   There are three treatment options for women with vaginal dryness:  Vaginal lubricants and moisturizers - Vaginal lubricants and moisturizers can be purchased without a prescription. These products do not contain any hormones and have virtually no side effects. -  Albolene is found in the facial cleanser section at [facility], [facility], or [employer]. It is a large jar with a blue top. This is the best lubricant for women because it is hypoallergenic. -Natural lubricants, such as olive, avocado or peanut oil, are easily available products that may be used as a lubricant with sex.  -Vaginal moisturizes (eg, Replens, Moist Again, Vagisil, K-Y Silk-E, and Feminease) are formulated to allow water to be retained in the vaginal tissues. Moisturizers are applied into the vagina three times weekly to allow a continued moisturizing effect. These should not be used just before having sex, as they can be irritating.  Vaginal estrogen - Vaginal estrogen is the most effective treatment option for women with vaginal dryness. Vaginal estrogen must be prescribed by a healthcare provider. Very low doses of vaginal estrogen can be used when it is put into the vagina to treat vaginal dryness. A small amount of estrogen is absorbed into the bloodstream, but only about 100 times less than when using estrogen pills or tablets. As a result, there is a much lower risk of side effects, such as blood clots, breast cancer, and heart attack, compared with other estrogen-containing products (birth control pills, menopausal hormone therapy).   Ospemifene - Ospemifene is a prescription medication that is similar to estrogen, but is not estrogen. In the vaginal tissue, it acts similarly to estrogen. In the breast tissue, it acts as an estrogen blocker. It comes in a pill, and is prescribed for women who want to use an estrogen-like medication for vaginal dryness or painful sex associated with vaginal dryness, but prefer not to use a vaginal medication. The medication may cause hot  flashes as a side effect. This type of medication may increase the risk of blood clots or uterine cancer. Further study of ospemifene is needed to evaluate the risk of these complications. This medication has not been tested in  women who have had breast cancer or are at a high risk of developing breast cancer.    Sexual activity - Vaginal estrogen improves vaginal dryness quickly, usually within a few weeks. You may continue to have sex as you treat vaginal dryness because sex itself can help to keep the vaginal tissues healthy. Vaginal intercourse may help the vaginal tissues by keeping them soft and stretchable and preventing the tissues from shrinking.  If sex continues to be painful despite treatment for vaginal dryness, talk to your healthcare provider.

## 2017-03-18 LAB — WET PREP BY MOLECULAR PROBE
Candida species: NOT DETECTED
Gardnerella vaginalis: NOT DETECTED
Trichomonas vaginosis: NOT DETECTED

## 2017-04-13 ENCOUNTER — Encounter: Payer: Self-pay | Admitting: Physician Assistant

## 2017-04-13 MED ORDER — PROMETHAZINE HCL 25 MG PO TABS: 25.0000 mg | ORAL_TABLET | Freq: Four times a day (QID) | ORAL | 0 refills | Status: DC | PRN

## 2017-04-13 MED ORDER — NABUMETONE 500 MG PO TABS: 500.0000 mg | ORAL_TABLET | Freq: Two times a day (BID) | ORAL | 0 refills | Status: DC | PRN

## 2017-04-13 MED ORDER — HYOSCYAMINE SULFATE SL 0.125 MG SL SUBL
0.1250 mg | SUBLINGUAL_TABLET | SUBLINGUAL | 0 refills | Status: DC | PRN
Start: 2017-04-13 — End: 2018-03-28

## 2017-04-13 MED ORDER — CIPROFLOXACIN HCL 500 MG PO TABS: 500.0000 mg | ORAL_TABLET | Freq: Two times a day (BID) | ORAL | 0 refills | Status: DC

## 2017-05-23 ENCOUNTER — Encounter: Payer: Self-pay | Admitting: Physician Assistant

## 2017-07-03 ENCOUNTER — Encounter: Payer: Self-pay | Admitting: Physician Assistant

## 2017-07-10 ENCOUNTER — Other Ambulatory Visit: Payer: Self-pay | Admitting: Physician Assistant

## 2017-07-10 DIAGNOSIS — F411 Generalized anxiety disorder: Secondary | ICD-10-CM

## 2017-07-15 ENCOUNTER — Other Ambulatory Visit: Payer: Self-pay | Admitting: Internal Medicine

## 2017-07-15 MED ORDER — PROMETHAZINE-DM 6.25-15 MG/5ML PO SYRP: ORAL_SOLUTION | ORAL | 1 refills | Status: DC

## 2017-07-15 MED ORDER — AZITHROMYCIN 250 MG PO TABS: ORAL_TABLET | ORAL | 0 refills | Status: DC

## 2017-07-15 MED ORDER — PREDNISONE 20 MG PO TABS: ORAL_TABLET | ORAL | 0 refills | Status: DC

## 2017-07-27 NOTE — Progress Notes (Signed)
Subjective:    Patient ID: Amanda Salazar, female    DOB: 1960-10-07, 56 y.o.   MRN: [id number]  HPI 56 y.o. WF presents with cold symptoms. Was given prednisone and cough syrup on 07/15/2017, did not take zpak until 12/16, finished today. Still have sinus congestion, sinus drainage and very bad headache. She denies any associated neurological complications or symptoms, such as one-sided weakness, numbness, tingling, slurring of speech, droopy face, swallowing difficulties, diplopia, vision loss, hearing loss or tinnitus.    Blood pressure 120/60, pulse 88, temperature (!) 97.5 F (36.4 C), resp. rate 16, height 5\' 2"  (1.575 m), weight 121 lb 12.8 oz (55.2 kg), last menstrual period 05/11/2012.  Medications Current Outpatient Medications on File Prior to Visit  Medication Sig  . ALPRAZolam (XANAX) 0.5 MG tablet 1/2-1 pill as needed daily for anxiety  . amphetamine-dextroamphetamine (ADDERALL) 10 MG tablet Take 1 tablet (10 mg total) by mouth 2 (two) times daily with a meal.  . cetirizine (ZYRTEC) 10 MG tablet Take 10 mg by mouth daily.  . Cholecalciferol (VITAMIN D3 MAXIMUM STRENGTH) 5000 UNITS capsule Take 5,000 Units by mouth daily.  . clotrimazole-betamethasone (LOTRISONE) cream Apply 1 application topically 2 (two) times daily.  . Cyanocobalamin (VITAMIN B 12 PO) Take by mouth daily.  . cycloSPORINE (RESTASIS) 0.05 % ophthalmic emulsion Place 1 drop into both eyes 2 (two) times daily.  Marland Kitchen escitalopram (LEXAPRO) 10 MG tablet TAKE 1 TABLET BY MOUTH EVERY DAY  . Hyoscyamine Sulfate SL (LEVSIN/SL) 0.125 MG SUBL Place 0.125 mg under the tongue every 4 (four) hours as needed.  . nabumetone (RELAFEN) 500 MG tablet Take 1 tablet (500 mg total) by mouth 2 (two) times daily as needed for moderate pain (avoid with alcohol).  . Omega-3 Fatty Acids (FISH OIL) 1200 MG CAPS Take 1,200 mg by mouth daily.  Marland Kitchen OVER THE COUNTER MEDICATION Turmeric daily  . promethazine (PHENERGAN) 25 MG tablet Take 1  tablet (25 mg total) by mouth every 6 (six) hours as needed for nausea or vomiting (can cause fatigue). Max: 4 tablets per day  . triamcinolone (NASACORT ALLERGY 24HR) 55 MCG/ACT AERO nasal inhaler Place 2 sprays into the nose daily.   No current facility-administered medications on file prior to visit.     Problem list She has Hyperlipidemia; Allergic rhinitis; GERD; Dense breast tissue; Basal cell carcinoma; Generalized anxiety disorder; and ADD (attention deficit disorder) on their problem list.   Review of Systems  Constitutional: Negative.  Negative for chills, fatigue and fever.  HENT: Positive for ear pain, postnasal drip and sinus pressure. Negative for congestion, dental problem, drooling, ear discharge, facial swelling, hearing loss, mouth sores, nosebleeds, rhinorrhea, sneezing, sore throat, tinnitus and trouble swallowing.   Eyes: Negative.  Negative for visual disturbance.  Respiratory: Positive for cough. Negative for apnea, choking, chest tightness, shortness of breath, wheezing and stridor.   Cardiovascular: Negative.   Genitourinary: Negative.   Neurological: Positive for headaches. Negative for dizziness, tremors, seizures, syncope, facial asymmetry, speech difficulty, weakness, light-headedness and numbness.       Objective:   Physical Exam  Constitutional: She is oriented to person, place, and time. She appears well-developed and well-nourished.  HENT:  Head: Normocephalic and atraumatic.  Right Ear: Hearing and external ear normal. No mastoid tenderness. Tympanic membrane is not injected, not erythematous, not retracted and not bulging. A middle ear effusion is present.  Left Ear: Hearing and external ear normal. No mastoid tenderness. Tympanic membrane is not injected, not erythematous, not  retracted and not bulging. A middle ear effusion is present.  Nose: Right sinus exhibits maxillary sinus tenderness. Left sinus exhibits maxillary sinus tenderness.  + TMJ  bilateral, no abscess or dental issues identified  Eyes: Conjunctivae are normal. Pupils are equal, round, and reactive to light.  Neck: Normal range of motion. Neck supple.  Cardiovascular: Normal rate.  Pulmonary/Chest: Effort normal and breath sounds normal.  Musculoskeletal: Normal range of motion.  Neurological: She is alert and oriented to person, place, and time.  Skin: Skin is warm and dry.       Assessment & Plan:   Nonintractable headache, unspecified chronicity pattern, unspecified headache type Sinus pressure ? Sinuses versus TMJ Start levaquin if not better or any fever/chills.  -     levofloxacin (LEVAQUIN) 500 MG tablet; Take 1 tablet (500 mg total) by mouth daily. -     cyclobenzaprine (FLEXERIL) 10 MG tablet; Take 1 tablet (10 mg total) by mouth at bedtime as needed for muscle spasms (jaw pain). information given to the patient, no gum/decrease hard foods, warm wet wash clothes, decrease stress,  can do massage, and exercise.  Very important to follow up with dentist  Attention deficit disorder, unspecified hyperactivity presence -     amphetamine-dextroamphetamine (ADDERALL) 10 MG tablet; Take 1 tablet (10 mg total) by mouth 2 (two) times daily with a meal.     Future Appointments  Date Time Provider [place]  08/21/2017  4:00 PM Vicie Mutters, PA-C [facility] None

## 2017-07-28 ENCOUNTER — Ambulatory Visit (INDEPENDENT_AMBULATORY_CARE_PROVIDER_SITE_OTHER): Payer: Managed Care, Other (non HMO) | Admitting: Physician Assistant

## 2017-07-28 VITALS — BP 120/60 | HR 88 | Temp 97.5°F | Resp 16 | Ht 62.0 in | Wt 121.8 lb

## 2017-07-28 DIAGNOSIS — J3489 Other specified disorders of nose and nasal sinuses: Secondary | ICD-10-CM

## 2017-07-28 DIAGNOSIS — R51 Headache: Secondary | ICD-10-CM | POA: Diagnosis not present

## 2017-07-28 DIAGNOSIS — R519 Headache, unspecified: Secondary | ICD-10-CM

## 2017-07-28 DIAGNOSIS — F988 Other specified behavioral and emotional disorders with onset usually occurring in childhood and adolescence: Secondary | ICD-10-CM

## 2017-07-28 MED ORDER — CYCLOBENZAPRINE HCL 10 MG PO TABS: 10.0000 mg | ORAL_TABLET | Freq: Every evening | ORAL | 0 refills | Status: DC | PRN

## 2017-07-28 MED ORDER — AMPHETAMINE-DEXTROAMPHETAMINE 10 MG PO TABS: 10.0000 mg | ORAL_TABLET | Freq: Two times a day (BID) | ORAL | 0 refills | Status: DC

## 2017-07-28 MED ORDER — LEVOFLOXACIN 500 MG PO TABS: 500.0000 mg | ORAL_TABLET | Freq: Every day | ORAL | 0 refills | Status: DC

## 2017-07-28 NOTE — Patient Instructions (Signed)
See your dentist about TMJ Read below, do heating pad, soft foods Given zpak a few more days in your system and then take levaquin if not better  What is the TMJ? The temporomandibular (tem-PUH-ro-man-DIB-[id number]-ler) joint, or the TMJ, connects the upper and lower jawbones. This joint allows the jaw to open wide and move back and forth when you chew, talk, or yawn.There are also several muscles that help this joint move. There can be muscle tightness and pain in the muscle that can cause several symptoms.  What causes TMJ pain? There are many causes of TMJ pain. Repeated chewing (for example, chewing gum) and clenching your teeth can cause pain in the joint. Some TMJ pain has no obvious cause. What can I do to ease the pain? There are many things you can do to help your pain get better. When you have pain:  Eat soft foods and stay away from chewy foods (for example, taffy) Try to use both sides of your mouth to chew Don't chew gum Massage Don't open your mouth wide (for example, during yawning or singing) Don't bite your cheeks or fingernails Lower your amount of stress and worry Applying a warm, damp washcloth to the joint may help. Over-the-counter pain medicines such as ibuprofen (one brand: Advil) or acetaminophen (one brand: Tylenol) might also help. Do not use these medicines if you are allergic to them or if your doctor told you not to use them. How can I stop the pain from coming back? When your pain is better, you can do these exercises to make your muscles stronger and to keep the pain from coming back:  Resisted mouth opening: Place your thumb or two fingers under your chin and open your mouth slowly, pushing up lightly on your chin with your thumb. Hold for three to six seconds. Close your mouth slowly. Resisted mouth closing: Place your thumbs under your chin and your two index fingers on the ridge between your mouth and the bottom of your chin. Push down lightly on your chin as you  close your mouth. Tongue up: Slowly open and close your mouth while keeping the tongue touching the roof of the mouth. Side-to-side jaw movement: Place an object about one fourth of an inch thick (for example, two tongue depressors) between your front teeth. Slowly move your jaw from side to side. Increase the thickness of the object as the exercise becomes easier Forward jaw movement: Place an object about one fourth of an inch thick between your front teeth and move the bottom jaw forward so that the bottom teeth are in front of the top teeth. Increase the thickness of the object as the exercise becomes easier. These exercises should not be painful. If it hurts to do these exercises, stop doing them and talk to your family doctor.

## 2017-08-18 NOTE — Progress Notes (Signed)
Complete Physical  Assessment and Plan: Hyperlipidemia -continue medications, check lipids, decrease fatty foods, increase activity.  - CBC with Differential/Platelet - BASIC METABOLIC PANEL WITH GFR - Hepatic function panel - Lipid panel - TSH - EKG 12-Lead   Dense breast tissue Get 3D MGM   Basal cell carcinoma Follow up tafeen  Generalized anxiety disorder Continue medications  Gastroesophageal reflux disease, esophagitis presence not specified Continue PPI/H2 blocker, diet discussed  Allergic rhinitis, unspecified allergic rhinitis type Continue OTC allergy pills  Screening for blood or protein in urine - Urinalysis, Routine w reflex microscopic (not at [facility]) - Microalbumin / creatinine urine ratio  Routine general medical examination at a health care facility 1 year   Medication management - Magnesium   Vitamin D deficiency - Vit D  25 hydroxy (rtn osteoporosis monitoring)  Discussed med's effects and SE's. Screening labs and tests as requested with regular follow-up as recommended.  HPI 57 y.o. female  presents for a complete physical.  Her blood pressure has been controlled at home, today their BP is BP: 122/67 She does not workout. She denies chest pain, shortness of breath, dizziness.  She is not on cholesterol medication and denies myalgias. Her cholesterol is at goal. The cholesterol last visit was:   Lab Results  Component Value Date   CHOL 242 (H) 03/17/2017   HDL 62 03/17/2017   LDLCALC 153 (H) 03/17/2017   LDLDIRECT 151.2 01/03/2008   TRIG 133 03/17/2017   CHOLHDL 3.9 03/17/2017   Last A1C in the office was:  Lab Results  Component Value Date   HGBA1C 4.8 06/28/2016   Patient is on Vitamin D supplement.  Lab Results  Component Value Date   VD25OH 55 06/28/2016   Lab Results  Component Value Date   VITAMINB12 1,613 (H) 04/30/2015   Lab Results  Component Value Date   IRON 153 06/28/2016   TIBC 335 06/28/2016   FERRITIN 97  06/28/2016   Works at [employer], increased stress/time but has improved. 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  Has had feet/hand pain, has had negative work up for autoimmune and is on tumeric that is helping.  BMI is Body mass index is 22.86 kg/m., she is working on diet and exercise. Wt Readings from Last 3 Encounters:  08/21/17 125 lb (56.7 kg)  07/28/17 121 lb 12.8 oz (55.2 kg)  03/17/17 118 lb (53.5 kg)     Current Medications:  Current Outpatient Medications on File Prior to Visit  Medication Sig  . ALPRAZolam (XANAX) 0.5 MG tablet 1/2-1 pill as needed daily for anxiety  . amphetamine-dextroamphetamine (ADDERALL) 10 MG tablet Take 1 tablet (10 mg total) by mouth 2 (two) times daily with a meal.  . cetirizine (ZYRTEC) 10 MG tablet Take 10 mg by mouth daily.  . Cholecalciferol (VITAMIN D3 MAXIMUM STRENGTH) 5000 UNITS capsule Take 5,000 Units by mouth daily.  . clotrimazole-betamethasone (LOTRISONE) cream Apply 1 application topically 2 (two) times daily.  . Cyanocobalamin (VITAMIN B 12 PO) Take by mouth daily.  . cyclobenzaprine (FLEXERIL) 10 MG tablet Take 1 tablet (10 mg total) by mouth at bedtime as needed for muscle spasms (jaw pain).  . cycloSPORINE (RESTASIS) 0.05 % ophthalmic emulsion Place 1 drop into both eyes 2 (two) times daily.  Marland Kitchen escitalopram (LEXAPRO) 10 MG tablet TAKE 1 TABLET BY MOUTH EVERY DAY  . Hyoscyamine Sulfate SL (LEVSIN/SL) 0.125 MG SUBL  Place 0.125 mg under the tongue every 4 (four) hours as needed.  . nabumetone (RELAFEN) 500 MG tablet Take 1 tablet (500 mg total) by mouth 2 (two) times daily as needed for moderate pain (avoid with alcohol).  . Omega-3 Fatty Acids (FISH OIL) 1200 MG CAPS Take 1,200 mg by mouth daily.  Marland Kitchen OVER THE COUNTER MEDICATION Turmeric daily  . promethazine (PHENERGAN) 25 MG tablet Take 1 tablet (25 mg total) by mouth every 6  (six) hours as needed for nausea or vomiting (can cause fatigue). Max: 4 tablets per day  . triamcinolone (NASACORT ALLERGY 24HR) 55 MCG/ACT AERO nasal inhaler Place 2 sprays into the nose daily.   No current facility-administered medications on file prior to visit.    Health Maintenance:   Immunization History  Administered Date(s) Administered  . Tdap 04/16/2012   Tetanus: 2013 Pneumovax: N/A Prevnar 13: due age 54 Flu vaccine: declines Zostavax: due at 85 Pap: 2017 MGM: 06/29/2016 cat D suggest 3D, DUE DEXA: Colonoscopy:2011 Ct head 05/2012 EGD:2009  Medical History:  Past Medical History:  Diagnosis Date  . ADD (attention deficit disorder)   . Blurred vision   . 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 Seasonal allergies    Allergies Allergies  Allergen Reactions  . Erythromycin Nausea And Vomiting    SURGICAL HISTORY She  has a past surgical history that includes Breast surgery; Cesarean section; and Eye surgery. FAMILY HISTORY Her family history includes Hypertension in her mother. SOCIAL HISTORY She  reports that  has never smoked. she has never used smokeless tobacco. She reports that she drinks alcohol. She reports that she does not use drugs.  Review of Systems  Constitutional: Negative.   HENT: Negative.   Eyes: Negative.   Respiratory: Negative.   Cardiovascular: Negative.   Gastrointestinal: Negative.   Genitourinary: Negative.   Musculoskeletal: Negative.   Skin: Negative.   Neurological: Negative.  Negative for dizziness, tingling, tremors, sensory change, speech change, focal weakness, seizures and loss of consciousness.  Endo/Heme/Allergies: Negative.   Psychiatric/Behavioral: Positive for depression and memory loss. Negative for hallucinations, substance abuse and suicidal ideas. The patient is nervous/anxious and has insomnia.      Physical Exam: Estimated body mass index is 22.86 kg/m as calculated from the following:   Height as of this encounter:  5\' 2"  (1.575 m).   Weight as of this encounter: 125 lb (56.7 kg). BP 122/67   Pulse 62   Temp 98.4 F (36.9 C)   Resp 16   Ht 5\' 2"  (1.575 m)   Wt 125 lb (56.7 kg)   LMP 05/11/2012   SpO2 98%   BMI 22.86 kg/m  General Appearance: Well nourished, in no apparent distress. Eyes: PERRLA, EOMs, conjunctiva no swelling or erythema, normal fundi and vessels. Sinuses: No Frontal/maxillary tenderness ENT/Mouth: Ext aud canals clear, normal light reflex with TMs without erythema, bulging.  Good dentition. No erythema, swelling, or exudate on post pharynx. Tonsils not swollen or erythematous. Hearing normal.  Neck: Supple, thyroid normal. No bruits Respiratory: Respiratory effort normal, BS equal bilaterally without rales, rhonchi, wheezing or stridor. Cardio: RRR without murmurs, rubs or gallops. Brisk peripheral pulses without edema.  Chest: symmetric, with normal excursions and percussion. Breasts: Symmetric, without lumps, nipple discharge, retractions. Abdomen: Soft, +BS. Non tender, no guarding, rebound, hernias, masses, or organomegaly. .  Lymphatics: Non tender without lymphadenopathy.  Genitourinary: decline Musculoskeletal: Full ROM all peripheral extremities,5/5 strength, and normal gait. Skin: Warm, dry without rashes, lesions, ecchymosis.  Neuro: Cranial nerves intact, reflexes equal bilaterally. Normal muscle tone, no cerebellar symptoms. Sensation intact.  Psych: Awake and oriented X 3, normal affect, Insight and Judgment appropriate.   EKG: defer   Vicie Mutters 4:30 PM

## 2017-08-21 ENCOUNTER — Ambulatory Visit (INDEPENDENT_AMBULATORY_CARE_PROVIDER_SITE_OTHER): Payer: Managed Care, Other (non HMO) | Admitting: Physician Assistant

## 2017-08-21 ENCOUNTER — Encounter: Payer: Self-pay | Admitting: Physician Assistant

## 2017-08-21 VITALS — BP 122/67 | HR 62 | Temp 98.4°F | Resp 16 | Ht 62.0 in | Wt 125.0 lb

## 2017-08-21 DIAGNOSIS — Z79899 Other long term (current) drug therapy: Secondary | ICD-10-CM | POA: Diagnosis not present

## 2017-08-21 DIAGNOSIS — E559 Vitamin D deficiency, unspecified: Secondary | ICD-10-CM

## 2017-08-21 DIAGNOSIS — J309 Allergic rhinitis, unspecified: Secondary | ICD-10-CM

## 2017-08-21 DIAGNOSIS — E785 Hyperlipidemia, unspecified: Secondary | ICD-10-CM

## 2017-08-21 DIAGNOSIS — Z1389 Encounter for screening for other disorder: Secondary | ICD-10-CM

## 2017-08-21 DIAGNOSIS — K219 Gastro-esophageal reflux disease without esophagitis: Secondary | ICD-10-CM

## 2017-08-21 DIAGNOSIS — Z0000 Encounter for general adult medical examination without abnormal findings: Secondary | ICD-10-CM

## 2017-08-21 DIAGNOSIS — C4491 Basal cell carcinoma of skin, unspecified: Secondary | ICD-10-CM

## 2017-08-21 DIAGNOSIS — Z0001 Encounter for general adult medical examination with abnormal findings: Secondary | ICD-10-CM

## 2017-08-21 DIAGNOSIS — R922 Inconclusive mammogram: Secondary | ICD-10-CM

## 2017-08-21 DIAGNOSIS — Z6822 Body mass index (BMI) 22.0-22.9, adult: Secondary | ICD-10-CM

## 2017-08-21 DIAGNOSIS — I10 Essential (primary) hypertension: Secondary | ICD-10-CM

## 2017-08-21 DIAGNOSIS — F411 Generalized anxiety disorder: Secondary | ICD-10-CM

## 2017-08-21 DIAGNOSIS — F988 Other specified behavioral and emotional disorders with onset usually occurring in childhood and adolescence: Secondary | ICD-10-CM

## 2017-08-21 NOTE — Patient Instructions (Addendum)
[facility]  7 a.m.-6:30 p.m., Monday 7 a.m.-5 p.m., Tuesday-Friday Schedule an appointment by calling [phone].  Encourage you to get the 3D Mammogram  The 3D Mammogram is much more specific and sensitive to pick up breast cancer. For women with fibrocystic breast or lumpy breast it can be hard to determine if it is cancer or not but the 3D mammogram is able to tell this difference which cuts back on unneeded additional tests or scary call backs.   - over 40% increase in detection of breast cancer - over 40% reduction in false positives.  - fewer call backs - reduced anxiety - improved outcomes - [facility]  Increase water Bring lunch Do 5 bicep curls a day  Being dehydrated can hurt your kidneys, cause fatigue, headaches, muscle aches, joint pain, and dry skin/nails so please increase your fluids.   Drink 80-100 oz a day of water, measure it out! Eat 3 meals a day, have to do breakfast, eat protein- hard boiled eggs, protein bar like [place] protein bar, greek yogurt like oikos triple zero, chobani 100, or light n fit greek  Can check out plantnanny app on your phone to help you keep track of your water   Are you an emotional eater? Do you eat more when you're feeling stressed? Do you eat when you're not hungry or when you're full? Do you eat to feel better (to calm and soothe yourself when you're sad, mad, bored, anxious, etc.)? Do you reward yourself with food? Do you regularly eat until you've stuffed yourself? Does food make you feel safe? Do you feel like food is a friend? Do you feel powerless or out of control around food?  If you answered yes to some of these questions than it is likely that you are an emotional eater. This is normally a learned behavior and can take time to first recognize the signs and second BREAK THE HABIT. But here is more information and tips to help.   The difference between emotional hunger and  physical hunger Emotional hunger can be powerful, so it's easy to mistake it for physical hunger. But there are clues you can look for to help you tell physical and emotional hunger apart.  Emotional hunger comes on suddenly. It hits you in an instant and feels overwhelming and urgent. Physical hunger, on the other hand, comes on more gradually. The urge to eat doesn't feel as dire or demand instant satisfaction (unless you haven't eaten for a very long time).  Emotional hunger craves specific comfort foods. When you're physically hungry, almost anything sounds good-including healthy stuff like vegetables. But emotional hunger craves junk food or sugary snacks that provide an instant rush. You feel like you need cheesecake or pizza, and nothing else will do.  Emotional hunger often leads to mindless eating. Before you know it, you've eaten a whole bag of chips or an entire pint of ice cream without really paying attention or fully enjoying it. When you're eating in response to physical hunger, you're typically more aware of what you're doing.  Emotional hunger isn't satisfied once you're full. You keep wanting more and more, often eating until you're uncomfortably stuffed. Physical hunger, on the other hand, doesn't need to be stuffed. You feel satisfied when your stomach is full.  Emotional hunger isn't located in the stomach. Rather than a growling belly or a pang in your stomach, you feel your hunger as a craving you can't get  out of your head. You're focused on specific textures, tastes, and smells.  Emotional hunger often leads to regret, guilt, or shame. When you eat to satisfy physical hunger, you're unlikely to feel guilty or ashamed because you're simply giving your body what it needs. If you feel guilty after you eat, it's likely because you know deep down that you're not eating for nutritional reasons.  Identify your emotional eating triggers What situations, places, or feelings make you  reach for the comfort of food? Most emotional eating is linked to unpleasant feelings, but it can also be triggered by positive emotions, such as rewarding yourself for achieving a goal or celebrating a holiday or happy event. Common causes of emotional eating include:  Stuffing emotions - Eating can be a way to temporarily silence or "stuff down" uncomfortable emotions, including anger, fear, sadness, anxiety, loneliness, resentment, and shame. While you're numbing yourself with food, you can avoid the difficult emotions you'd rather not feel.  Boredom or feelings of emptiness - Do you ever eat simply to give yourself something to do, to relieve boredom, or as a way to fill a void in your life? You feel unfulfilled and empty, and food is a way to occupy your mouth and your time. In the moment, it fills you up and distracts you from underlying feelings of purposelessness and dissatisfaction with your life.  Childhood habits - Think back to your childhood memories of food. Did your parents reward good behavior with ice cream, take you out for pizza when you got a good report card, or serve you sweets when you were feeling sad? These habits can often carry over into adulthood. Or your eating may be driven by nostalgia-for cherished memories of grilling burgers in the backyard with your dad or baking and eating cookies with your mom.  Social influences - Getting together with other people for a meal is a great way to relieve stress, but it can also lead to overeating. It's easy to overindulge simply because the food is there or because everyone else is eating. You may also overeat in social situations out of nervousness. Or perhaps your family or circle of friends encourages you to overeat, and it's easier to go along with the group.  Stress - Ever notice how stress makes you hungry? It's not just in your mind. When stress is chronic, as it so often is in our chaotic, fast-paced world, your body produces high  levels of the stress hormone, cortisol. Cortisol triggers cravings for salty, sweet, and fried foods-foods that give you a burst of energy and pleasure. The more uncontrolled stress in your life, the more likely you are to turn to food for emotional relief.  Find other ways to feed your feelings If you don't know how to manage your emotions in a way that doesn't involve food, you won't be able to control your eating habits for very long. Diets so often fail because they offer logical nutritional advice which only works if you have conscious control over your eating habits. It doesn't work when emotions hijack the process, demanding an immediate payoff with food.  In order to stop emotional eating, you have to find other ways to fulfill yourself emotionally. It's not enough to understand the cycle of emotional eating or even to understand your triggers, although that's a huge first step. You need alternatives to food that you can turn to for emotional fulfillment.  Alternatives to emotional eating If you're depressed or lonely, call someone who  always makes you feel better, play with your dog or cat, or look at a favorite photo or cherished memento.  If you're anxious, expend your nervous energy by dancing to your favorite song, squeezing a stress ball, or taking a brisk walk.  If you're exhausted, treat yourself with a hot cup of tea, take a bath, light some scented candles, or wrap yourself in a warm blanket.  If you're bored, read a good book, watch a comedy show, explore the outdoors, or turn to an activity you enjoy (woodworking, playing the guitar, shooting hoops, scrapbooking, etc.).  What is mindful eating? Mindful eating is a practice that develops your awareness of eating habits and allows you to pause between your triggers and your actions. Most emotional eaters feel powerless over their food cravings. When the urge to eat hits, you feel an almost unbearable tension that demands to be fed,  right now. Because you've tried to resist in the past and failed, you believe that your willpower just isn't up to snuff. But the truth is that you have more power over your cravings than you think.  Take 5 before you give in to a craving Emotional eating tends to be automatic and virtually mindless. Before you even realize what you're doing, you've reached for a tub of ice cream and polished off half of it. But if you can take a moment to pause and reflect when you're hit with a craving, you give yourself the opportunity to make a different decision.  Can you put off eating for five minutes? Or just start with one minute. Don't tell yourself you can't give in to the craving; remember, the forbidden is extremely tempting. Just tell yourself to wait.  While you're waiting, check in with yourself. How are you feeling? What's going on emotionally? Even if you end up eating, you'll have a better understanding of why you did it. This can help you set yourself up for a different response next time.  How to practice mindful eating Eating while you're also doing other things-such as watching TV, driving, or playing with your phone-can prevent you from fully enjoying your food. Since your mind is elsewhere, you may not feel satisfied or continue eating even though you're no longer hungry. Eating more mindfully can help focus your mind on your food and the pleasure of a meal and curb overeating.   Eat your meals in a calm place with no distractions, aside from any dining companions.  Try eating with your non-dominant hand or using chopsticks instead of a knife and fork. Eating in such a non-familiar way can slow down how fast you eat and ensure your mind stays focused on your food.  Allow yourself enough time not to have to rush your meal. Set a timer for 20 minutes and pace yourself so you spend at least that much time eating.  Take small bites and chew them well, taking time to notice the different  flavors and textures of each mouthful.  Put your utensils down between bites. Take time to consider how you feel-hungry, satiated-before picking up your utensils again.  Try to stop eating before you are full.It takes time for the signal to reach your brain that you've had enough. Don't feel obligated to always clean your plate.  When you've finished your food, take a few moments to assess if you're really still hungry before opting for an extra serving or dessert.  Learn to accept your feelings-even the bad ones  While it  may seem that the core problem is that you're powerless over food, emotional eating actually stems from feeling powerless over your emotions. You don't feel capable of dealing with your feelings head on, so you avoid them with food.  Recommended reading  Healthy Eating: A guide to the new nutrition - [facility] Report  10 Tips for Mindful Eating - How mindfulness can help you fully enjoy a meal and the experience of eating-with moderation and restraint. ([place])  Weight Loss: Gain Control of Emotional Eating - Tips to regain control of your eating habits. [facility])  Why Stress Causes People to Overeat -Tips on controlling stress eating. ([place])  Mindful Eating Meditations -Free online mindfulness meditations. ([facility])

## 2017-08-22 LAB — BASIC METABOLIC PANEL WITH GFR
BUN: 15 mg/dL (ref 7–25)
CHLORIDE: 103 mmol/L (ref 98–110)
CO2: 28 mmol/L (ref 20–32)
Calcium: 9.6 mg/dL (ref 8.6–10.4)
Creat: 0.75 mg/dL (ref 0.50–1.05)
GFR, Est African American: 103 mL/min/{1.73_m2} (ref >=60)
GFR, Est Non African American: 88 mL/min/{1.73_m2} (ref >=60)
GLUCOSE: 86 mg/dL (ref 65–99)
Potassium: 4.6 mmol/L (ref 3.5–5.3)
SODIUM: 140 mmol/L (ref 135–146)

## 2017-08-22 LAB — HEPATIC FUNCTION PANEL
AG Ratio: 1.7 (calc) (ref 1.0–2.5)
ALKALINE PHOSPHATASE (APISO): 118 U/L (ref 33–130)
ALT: 15 U/L (ref 6–29)
AST: 19 U/L (ref 10–35)
Albumin: 4.5 g/dL (ref 3.6–5.1)
BILIRUBIN DIRECT: 0.1 mg/dL (ref 0.0–0.2)
BILIRUBIN INDIRECT: 0.3 mg/dL (ref 0.2–1.2)
Globulin: 2.7 g/dL (calc) (ref 1.9–3.7)
TOTAL PROTEIN: 7.2 g/dL (ref 6.1–8.1)
Total Bilirubin: 0.4 mg/dL (ref 0.2–1.2)

## 2017-08-22 LAB — MICROALBUMIN / CREATININE URINE RATIO
CREATININE, URINE: 59 mg/dL (ref 20–275)
Microalb Creat Ratio: 5 mcg/mg creat (ref <30)
Microalb, Ur: 0.3 mg/dL

## 2017-08-22 LAB — URINALYSIS, ROUTINE W REFLEX MICROSCOPIC
BILIRUBIN URINE: NEGATIVE
Bacteria, UA: NONE SEEN /HPF
GLUCOSE, UA: NEGATIVE
HGB URINE DIPSTICK: NEGATIVE
Hyaline Cast: NONE SEEN /LPF
KETONES UR: NEGATIVE
NITRITE: NEGATIVE
PROTEIN: NEGATIVE
RBC / HPF: NONE SEEN /HPF (ref 0–2)
SQUAMOUS EPITHELIAL / LPF: NONE SEEN /HPF (ref <=5)
Specific Gravity, Urine: 1.014 (ref 1.001–1.03)
WBC UA: NONE SEEN /HPF (ref 0–5)
pH: 6.5 (ref 5.0–8.0)

## 2017-08-22 LAB — TSH: TSH: 1.64 mIU/L (ref 0.40–4.50)

## 2017-08-22 LAB — LIPID PANEL
Cholesterol: 236 mg/dL — ABNORMAL HIGH (ref <200)
HDL: 66 mg/dL (ref >50)
LDL Cholesterol (Calc): 142 mg/dL (calc) — ABNORMAL HIGH
NON-HDL CHOLESTEROL (CALC): 170 mg/dL — AB (ref <130)
Total CHOL/HDL Ratio: 3.6 (calc) (ref <5.0)
Triglycerides: 153 mg/dL — ABNORMAL HIGH (ref <150)

## 2017-08-22 LAB — MAGNESIUM: Magnesium: 2.2 mg/dL (ref 1.5–2.5)

## 2017-08-22 LAB — CBC WITH DIFFERENTIAL/PLATELET
BASOS ABS: 40 {cells}/uL (ref 0–200)
Basophils Relative: 0.5 %
EOS ABS: 79 {cells}/uL (ref 15–500)
Eosinophils Relative: 1 %
HCT: 40.8 % (ref 35.0–45.0)
Hemoglobin: 14.5 g/dL (ref 11.7–15.5)
Lymphs Abs: 1920 cells/uL (ref 850–3900)
MCH: 31.9 pg (ref 27.0–33.0)
MCHC: 35.5 g/dL (ref 32.0–36.0)
MCV: 89.9 fL (ref 80.0–100.0)
MONOS PCT: 6.4 %
MPV: 11.1 fL (ref 7.5–12.5)
Neutro Abs: 5356 cells/uL (ref 1500–7800)
Neutrophils Relative %: 67.8 %
PLATELETS: 273 10*3/uL (ref 140–400)
RBC: 4.54 10*6/uL (ref 3.80–5.10)
RDW: 12.5 % (ref 11.0–15.0)
TOTAL LYMPHOCYTE: 24.3 %
WBC mixed population: 506 cells/uL (ref 200–950)
WBC: 7.9 10*3/uL (ref 3.8–10.8)

## 2017-08-22 LAB — VITAMIN D 25 HYDROXY (VIT D DEFICIENCY, FRACTURES): Vit D, 25-Hydroxy: 40 ng/mL (ref 30–100)

## 2017-10-05 ENCOUNTER — Other Ambulatory Visit: Payer: Self-pay | Admitting: Physician Assistant

## 2017-10-05 DIAGNOSIS — F411 Generalized anxiety disorder: Secondary | ICD-10-CM

## 2017-10-05 MED ORDER — ESCITALOPRAM OXALATE 10 MG PO TABS: 10.0000 mg | ORAL_TABLET | Freq: Every day | ORAL | 1 refills | Status: DC

## 2017-10-16 ENCOUNTER — Ambulatory Visit (INDEPENDENT_AMBULATORY_CARE_PROVIDER_SITE_OTHER): Payer: Managed Care, Other (non HMO) | Admitting: Ophthalmology

## 2017-10-16 ENCOUNTER — Encounter (INDEPENDENT_AMBULATORY_CARE_PROVIDER_SITE_OTHER): Payer: Self-pay | Admitting: Ophthalmology

## 2017-10-16 DIAGNOSIS — H43813 Vitreous degeneration, bilateral: Secondary | ICD-10-CM

## 2017-10-16 DIAGNOSIS — H33312 Horseshoe tear of retina without detachment, left eye: Secondary | ICD-10-CM

## 2017-10-16 DIAGNOSIS — H35371 Puckering of macula, right eye: Secondary | ICD-10-CM | POA: Diagnosis not present

## 2017-10-16 DIAGNOSIS — H3581 Retinal edema: Secondary | ICD-10-CM

## 2017-10-16 DIAGNOSIS — H35341 Macular cyst, hole, or pseudohole, right eye: Secondary | ICD-10-CM | POA: Diagnosis not present

## 2017-10-16 DIAGNOSIS — H25813 Combined forms of age-related cataract, bilateral: Secondary | ICD-10-CM

## 2017-10-16 DIAGNOSIS — Z9889 Other specified postprocedural states: Secondary | ICD-10-CM

## 2017-10-16 MED ORDER — PREDNISOLONE ACETATE 1 % OP SUSP: 1.0000 [drp] | Freq: Four times a day (QID) | OPHTHALMIC | 0 refills | Status: AC

## 2017-10-16 NOTE — Progress Notes (Signed)
[facility] Note  10/16/2017     CHIEF COMPLAINT Patient presents for Retina Evaluation   HISTORY OF PRESENT ILLNESS: Amanda Salazar is a 57 y.o. female who presents to the clinic today for:   HPI    Retina Evaluation    In both eyes.  This started 1 week ago.  Associated Symptoms Floaters and Flashes.  Negative for Blind Spot, Glare, Shoulder/Hip pain, Fatigue, Jaw Claudication, Photophobia, Distortion, Pain, Redness, Scalp Tenderness, Weight Loss, Fever and Trauma.  Context:  distance vision, mid-range vision and near vision.  Treatments tried include laser.  Response to treatment was moderate improvement.  I, the attending physician,  performed the HPI with the patient and updated documentation appropriately.          Comments    Previous patient of DR. Rodena Piety. Patient was walk-in this am asking to be seen. Patient states she was on cruise ship this past week and experienced constant squiggly lines in her vision in the left eye last Monday(10/09/17). Pt reports she started having flashing of light Os on Saturday, she notice it more in the dark, vision has become worse OU. Pt reports she has noticed floaters OD, whereas before she did not notice them. Pt reports she has Hx of a wrinkle OD. Pt is use Restasis eye gtts PRN she is taking Fish oil , Tumeric QD. Pt reports she has Dull headaches. Pt reports she has allergic reaction to FA.        Last edited by [name], MD on 10/16/2017  9:27 AM. (History)    Pt states she was on a cruise ship and felt she had a detachment OS, pt states she was seeing a type of swirling OS; Pt states she was seeing flashes lastnight, pt states flashing occurs only in the dark and appear on temporal side; Pt states she saw Dr. Zigmund Daniel and states he "fixed a tear"; Pt reports she also had RK, states she sees [facility] for routine care;   Referring physician: [name], MD [place],  [place] [address]  HISTORICAL INFORMATION:   Selected notes from the [facility] Referred by Dr. Zigmund Daniel for possible RD LEE- 04.27.18 (JDM) [BCVA OD: 20/20-1 OS: 20/20-1] Ocular Hx-  PMH-     CURRENT MEDICATIONS: Current Outpatient Medications (Ophthalmic Drugs)  Medication Sig  . cycloSPORINE (RESTASIS) 0.05 % ophthalmic emulsion Place 1 drop into both eyes 2 (two) times daily.  . prednisoLONE acetate (PRED FORTE) 1 % ophthalmic suspension Place 1 drop into the left eye 4 (four) times daily for 7 days.   No current facility-administered medications for this visit.  (Ophthalmic Drugs)   Current Outpatient Medications (Other)  Medication Sig  . ALPRAZolam (XANAX) 0.5 MG tablet 1/2-1 pill as needed daily for anxiety  . amphetamine-dextroamphetamine (ADDERALL) 10 MG tablet Take 1 tablet (10 mg total) by mouth 2 (two) times daily with a meal.  . cetirizine (ZYRTEC) 10 MG tablet Take 10 mg by mouth daily.  . Cholecalciferol (VITAMIN D3 MAXIMUM STRENGTH) 5000 UNITS capsule Take 5,000 Units by mouth daily.  . Cyanocobalamin (VITAMIN B 12 PO) Take by mouth daily.  Marland Kitchen escitalopram (LEXAPRO) 10 MG tablet Take 1 tablet (10 mg total) by mouth daily.  Marland Kitchen Hyoscyamine Sulfate SL (LEVSIN/SL) 0.125 MG SUBL Place 0.125 mg under the tongue every 4 (four) hours as needed.  . nabumetone (RELAFEN) 500 MG tablet Take 1 tablet (500 mg total) by mouth  2 (two) times daily as needed for moderate pain (avoid with alcohol).  . Omega-3 Fatty Acids (FISH OIL) 1200 MG CAPS Take 1,200 mg by mouth daily.  Marland Kitchen OVER THE COUNTER MEDICATION Turmeric daily  . promethazine (PHENERGAN) 25 MG tablet Take 1 tablet (25 mg total) by mouth every 6 (six) hours as needed for nausea or vomiting (can cause fatigue). Max: 4 tablets per day  . triamcinolone (NASACORT ALLERGY 24HR) 55 MCG/ACT AERO nasal inhaler Place 2 sprays into the nose daily.  . clotrimazole-betamethasone (LOTRISONE) cream Apply 1 application topically 2 (two) times  daily. (Patient not taking: Reported on 10/16/2017)  . cyclobenzaprine (FLEXERIL) 10 MG tablet Take 1 tablet (10 mg total) by mouth at bedtime as needed for muscle spasms (jaw pain). (Patient not taking: Reported on 10/16/2017)   No current facility-administered medications for this visit.  (Other)      REVIEW OF SYSTEMS: ROS    Positive for: Eyes   Negative for: Constitutional, Gastrointestinal, Neurological, Skin, Genitourinary, Musculoskeletal, HENT, Endocrine, Cardiovascular, Respiratory, Psychiatric, Allergic/Imm, Heme/Lymph   Last edited by Zenovia Jordan, LPN on 2/77/8242  3:53 AM. (History)       ALLERGIES Allergies  Allergen Reactions  . Erythromycin Nausea And Vomiting    PAST MEDICAL HISTORY Past Medical History:  Diagnosis Date  . ADD (attention deficit disorder)   . Blurred vision   . 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 Seasonal allergies    Past Surgical History:  Procedure Laterality Date  . BREAST SURGERY     biopsy x 3  . CESAREAN SECTION     x 4  . EYE SURGERY      FAMILY HISTORY Family History  Problem Relation Age of Onset  . Hypertension Mother     SOCIAL HISTORY Social History   Tobacco Use  . Smoking status: Never Smoker  . Smokeless tobacco: Never Used  Substance Use Topics  . Alcohol use: Yes    Comment: glass wine most nights  . Drug use: No         OPHTHALMIC EXAM:  Base Eye Exam    Visual Acuity (Snellen - Linear)      Right Left   Dist [place] 20/40 -2 20/25 -1   Dist ph  NI 20/20 -2       Tonometry (Tonopen, 9:08 AM)      Right Left   Pressure 15 10       Pupils      Dark Light Shape React APD   Right 5 4 Round Slow None   Left 5 4 Round Slow None       Visual Fields (Counting fingers)      Left Right    Full Full       Extraocular Movement      Right Left    Full, Ortho Full, Ortho       Neuro/Psych    Oriented x3:  Yes   Mood/Affect:  Normal       Dilation    Both eyes:  1.0% Mydriacyl, 2.5%  Phenylephrine @ 9:08 AM        Slit Lamp and Fundus Exam    Slit Lamp Exam      Right Left   Lids/Lashes Normal Normal   Conjunctiva/Sclera White and quiet White and quiet   Cornea Arcus, 4 RK scars at 1200, 0300, 0600, and 0900 Arcus, Inferior 2+ Punctate epithelial erosions, 4 RK scars at 1100, 0200, 0500, 0800   Anterior  Chamber Deep and quiet Deep and quiet   Iris Round and dilated Round and dilated   Lens 2+ Nuclear sclerosis, 2+ Cortical cataract 2+ Nuclear sclerosis, 2+ Cortical cataract   Vitreous Mild Vitreous syneresis, Posterior vitreous detachment Vitreous syneresis, Posterior vitreous detachment       Fundus Exam      Right Left   Disc Normal Normal   C/D Ratio 0.4 0.4   Macula Nasal Epiretinal membrane with stria  Good foveal reflex, No heme or edema   Vessels Normal, with straightening into macula Normal   Periphery Attached, small hole with good laser surrounding at 1100 Attached, VR tuft at 130, small horseshoe tear at 0300        Refraction    Manifest Refraction      Sphere Cylinder Axis Dist VA   Right +0.50 +0.75 050 20/25-2   Left +0.50 +0.75 020 20/20          IMAGING AND PROCEDURES  Imaging and Procedures for 10/16/17  OCT, Retina - OU - Both Eyes     Right Eye Quality was good. Central Foveal Thickness: 375. Progression has worsened. Findings include macular pucker, epiretinal membrane, abnormal foveal contour, no IRF, no SRF, lamellar hole (Nasal ERM - slight interval thickening, vertically oriented lamellar hole, Trace cystic change).   Left Eye Quality was good. Central Foveal Thickness: 247. Progression has been stable. Findings include normal foveal contour, no IRF, no SRF.   Notes *Images captured and stored on drive  Diagnosis / Impression:  OD: ERM w/ vertically oriented lamellar hole -- mild interval thickening of ERM from prior (April 2018) OS: NFP, No IRF/SRF  Clinical management:  See below  Abbreviations: NFP - Normal  foveal profile. CME - cystoid macular edema. PED - pigment epithelial detachment. IRF - intraretinal fluid. SRF - subretinal fluid. EZ - ellipsoid zone. ERM - epiretinal membrane. ORA - outer retinal atrophy. ORT - outer retinal tubulation. SRHM - subretinal hyper-reflective material         Repair Retinal Breaks, Laser - OS - Left Eye     LASER PROCEDURE NOTE  Procedure:  Barrier laser retinopexy using slit lamp laser, LEFT eye   Diagnosis:   Retinal tear, LEFT eye                     Flap tear at 3 o'clock anterior to equator   Surgeon: [name], MD, PhD  Anesthesia: Topical  Informed consent obtained, operative eye marked, and time out performed prior to initiation of laser.   Laser settings:  Lumenis [id number] laser, slit lamp Lens: [place] PRP 165 Power: 220 mW Spot size: 200 microns Duration: 50 msec  # spots: 196  Placement of laser: Using a [place] PRP 165 contact lens at the slit lamp, laser was placed in three confluent rows around flap tear at 3 oclock anterior to equator with additional rows anteriorly.  Complications: None.  Patient tolerated the procedure well and received written and verbal post-procedure care information/education.                  ASSESSMENT/PLAN:    ICD-10-CM   1. Retinal tear of left eye H33.312 Repair Retinal Breaks, Laser - OS - Left Eye  2. Posterior vitreous detachment of both eyes H43.813   3. History of repair of retinal tear by laser photocoagulation Z98.890   4. Epiretinal membrane (ERM) of right eye H35.371   5. 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 Retinal edema H35.81 OCT, Retina - OU - Both Eyes  7. Combined forms of age-related cataract of both eyes H25.813     1,2. Acute PVD with retinal tear, OS   - The incidence, risk factors, and natural history of retinal tear was discussed with patient.   - Potential treatment options including laser retinopexy and cryotherapy discussed with patient. -  small horseshoe tear located at 0300 -- no SRF - recommend laser retinopexy - RBA of procedure discussed, questions answered - informed consent obtained and signed - see procedure note - pt tolerated procedure well - start PF QID OS x7 days - f/u in 1-2 wks  3. History of retinal tear OD - s/p laser retinopexy with Dr. Zigmund Daniel - stable  4,5. Epiretinal membrane w/ lamellar macular hole, OD The natural history, anatomy, potential for loss of vision, and treatment options including vitrectomy techniques and the complications of endophthalmitis, retinal detachment, vitreous hemorrhage, cataract progression and permanent vision loss discussed with the patient. - BCVA 20/25 - mild interval thickening of ERM from prior OCT done 11/2016 - monitor  6. No retinal edema on exam or OCT  7. Combined form age-related cataract OU-  - The symptoms of cataract, surgical options, and treatments and risks were discussed with patient. - discussed diagnosis and progression - not yet visually significant - monitor for now   Ophthalmic Meds Ordered this visit:  Meds ordered this encounter  Medications  . prednisoLONE acetate (PRED FORTE) 1 % ophthalmic suspension    Sig: Place 1 drop into the left eye 4 (four) times daily for 7 days.    Dispense:  10 mL    Refill:  0       Return in about 1 week (around 10/23/2017) for POV.  There are no Patient Instructions on file for this visit.   Explained the diagnoses, plan, and follow up with the patient and they expressed understanding.  Patient expressed understanding of the importance of proper follow up care.   This document serves as a record of services personally performed by Gardiner Sleeper, MD, PhD. It was created on their behalf by Catha Brow, [place], a certified ophthalmic assistant. The creation of this record is the provider's dictation and/or activities during the visit.  Electronically signed by: Catha Brow, [place]  10/16/17 11:26  PM   Gardiner Sleeper, M.D., Ph.D. Diseases & Surgery of the Retina and [place] 10/16/17   I have reviewed the above documentation for accuracy and completeness, and I agree with the above. Gardiner Sleeper, M.D., Ph.D. 10/16/17 11:26 PM    Abbreviations: M myopia (nearsighted); A astigmatism; H hyperopia (farsighted); P presbyopia; Mrx spectacle prescription;  CTL contact lenses; OD right eye; OS left eye; OU both eyes  XT exotropia; ET esotropia; PEK punctate epithelial keratitis; PEE punctate epithelial erosions; DES dry eye syndrome; MGD meibomian gland dysfunction; ATs artificial tears; PFAT's preservative free artificial tears; [place] nuclear sclerotic cataract; PSC posterior subcapsular cataract; ERM epi-retinal membrane; PVD posterior vitreous detachment; RD retinal detachment; DM diabetes mellitus; DR diabetic retinopathy; NPDR non-proliferative diabetic retinopathy; PDR proliferative diabetic retinopathy; CSME clinically significant macular edema; DME diabetic macular edema; dbh dot blot hemorrhages; CWS cotton wool spot; POAG primary open angle glaucoma; C/D cup-to-disc ratio; HVF humphrey visual field; GVF goldmann visual field; OCT optical coherence tomography; IOP intraocular pressure; BRVO Branch retinal vein occlusion; CRVO central retinal vein occlusion; CRAO central retinal artery occlusion; BRAO branch retinal artery occlusion;  RT retinal tear; SB scleral buckle; PPV pars plana vitrectomy; VH Vitreous hemorrhage; PRP panretinal laser photocoagulation; IVK intravitreal kenalog; VMT vitreomacular traction; MH Macular hole;  NVD neovascularization of the disc; NVE neovascularization elsewhere; AREDS age related eye disease study; ARMD age related macular degeneration; POAG primary open angle glaucoma; EBMD epithelial/anterior basement membrane dystrophy; ACIOL anterior chamber intraocular lens; IOL intraocular lens; PCIOL posterior chamber intraocular  lens; Phaco/IOL phacoemulsification with intraocular lens placement; [place] photorefractive keratectomy; LASIK laser assisted in situ keratomileusis; HTN hypertension; DM diabetes mellitus; COPD chronic obstructive pulmonary disease

## 2017-10-24 NOTE — Progress Notes (Signed)
[facility] Note  10/25/2017     CHIEF COMPLAINT Patient presents for Retina Follow Up   HISTORY OF PRESENT ILLNESS: Amanda Salazar is a 57 y.o. female who presents to the clinic today for:   HPI    Retina Follow Up    Patient presents with  Other.  In right eye.  This started 2 weeks ago.  Severity is mild.  Duration of 24 hours.  Since onset it is gradually worsening.  I, the attending physician,  performed the HPI with the patient and updated documentation appropriately.          Comments    S/P laser retinopexy OS (10/16/17) Patient states the floaters have increased OD, she continues to have a large floater OS that moves around constantly,her eyes seem to be tired a lot and want to closes when she is reading [place] on her phone and while working on the computer.  Pt reports she is concerned because "her bad eye was her right eye and now it seems that her left eye is the bad eye and she is afraid of loosing her vision". Patient is using her gtt's as instructed.       Last edited by [name], MD on 10/25/2017  3:15 PM. (History)    Pt states she continues to have a floater; Pt states she "still feels queasy" off and on; Pt reports she is now having trouble seeing, states she feels like she "just wants to close" OU;   Referring physician: [name], MD [place], [place] [address]  HISTORICAL INFORMATION:   Selected notes from the [facility] Referred by Dr. Zigmund Daniel for possible RD LEE- 04.27.18 (JDM) [BCVA OD: 20/20-1 OS: 20/20-1] Ocular Hx-  PMH-     CURRENT MEDICATIONS: Current Outpatient Medications (Ophthalmic Drugs)  Medication Sig  . cycloSPORINE (RESTASIS) 0.05 % ophthalmic emulsion Place 1 drop into both eyes 2 (two) times daily.   No current facility-administered medications for this visit.  (Ophthalmic Drugs)   Current Outpatient Medications (Other)  Medication Sig  .  ALPRAZolam (XANAX) 0.5 MG tablet 1/2-1 pill as needed daily for anxiety  . amphetamine-dextroamphetamine (ADDERALL) 10 MG tablet Take 1 tablet (10 mg total) by mouth 2 (two) times daily with a meal.  . cetirizine (ZYRTEC) 10 MG tablet Take 10 mg by mouth daily.  . Cholecalciferol (VITAMIN D3 MAXIMUM STRENGTH) 5000 UNITS capsule Take 5,000 Units by mouth daily.  . clotrimazole-betamethasone (LOTRISONE) cream Apply 1 application topically 2 (two) times daily.  . Cyanocobalamin (VITAMIN B 12 PO) Take by mouth daily.  . cyclobenzaprine (FLEXERIL) 10 MG tablet Take 1 tablet (10 mg total) by mouth at bedtime as needed for muscle spasms (jaw pain).  Marland Kitchen escitalopram (LEXAPRO) 10 MG tablet Take 1 tablet (10 mg total) by mouth daily.  Marland Kitchen Hyoscyamine Sulfate SL (LEVSIN/SL) 0.125 MG SUBL Place 0.125 mg under the tongue every 4 (four) hours as needed.  . nabumetone (RELAFEN) 500 MG tablet Take 1 tablet (500 mg total) by mouth 2 (two) times daily as needed for moderate pain (avoid with alcohol).  . Omega-3 Fatty Acids (FISH OIL) 1200 MG CAPS Take 1,200 mg by mouth daily.  Marland Kitchen OVER THE COUNTER MEDICATION Turmeric daily  . promethazine (PHENERGAN) 25 MG tablet Take 1 tablet (25 mg total) by mouth every 6 (six) hours as needed for nausea or vomiting (can cause fatigue). Max: 4 tablets per day  . triamcinolone (NASACORT  ALLERGY 24HR) 55 MCG/ACT AERO nasal inhaler Place 2 sprays into the nose daily.   No current facility-administered medications for this visit.  (Other)      REVIEW OF SYSTEMS: ROS    Positive for: Eyes   Negative for: Constitutional, Gastrointestinal, Neurological, Skin, Genitourinary, Musculoskeletal, HENT, Endocrine, Cardiovascular, Respiratory, Psychiatric, Allergic/Imm, Heme/Lymph   Last edited by Zenovia Jordan, LPN on 0/93/2355  7:32 PM. (History)       ALLERGIES Allergies  Allergen Reactions  . Dye Fdc Red [Red Dye] Anaphylaxis  . Iodinated Diagnostic Agents Anaphylaxis  .  Erythromycin Nausea And Vomiting    PAST MEDICAL HISTORY Past Medical History:  Diagnosis Date  . ADD (attention deficit disorder)   . Blurred vision   . 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 Seasonal allergies    Past Surgical History:  Procedure Laterality Date  . BREAST SURGERY     biopsy x 3  . CESAREAN SECTION     x 4  . EYE SURGERY      FAMILY HISTORY Family History  Problem Relation Age of Onset  . Hypertension Mother     SOCIAL HISTORY Social History   Tobacco Use  . Smoking status: Never Smoker  . Smokeless tobacco: Never Used  Substance Use Topics  . Alcohol use: Yes    Comment: glass wine most nights  . Drug use: No         OPHTHALMIC EXAM:  Base Eye Exam    Visual Acuity (Snellen - Linear)      Right Left   Dist [place] 20/40 -2 20/30 -2   Dist ph [place] NI 20/25 -2       Tonometry (Tonopen, 2:46 PM)      Right Left   Pressure 14 16       Pupils      Dark Light Shape React APD   Right 4 3 Round Brisk None   Left 4 3 Round Brisk None       Visual Fields (Counting fingers)      Left Right    Full Full       Extraocular Movement      Right Left    Full Full       Neuro/Psych    Oriented x3:  Yes   Mood/Affect:  Normal       Dilation    Right eye:  1.0% Mydriacyl, 2.5% Phenylephrine @ 2:47 PM        Slit Lamp and Fundus Exam    Slit Lamp Exam      Right Left   Lids/Lashes Normal Normal   Conjunctiva/Sclera White and quiet White and quiet   Cornea Arcus, 4 RK scars at 1200, 0300, 0600, and 0900 Arcus, Inferior 2+ Punctate epithelial erosions, 4 RK scars at 1100, 0200, 0500, 0800   Anterior Chamber Deep and quiet Deep and quiet   Iris Round and dilated Round and dilated   Lens 2+ Nuclear sclerosis, 2+ Cortical cataract 2+ Nuclear sclerosis, 2+ Cortical cataract   Vitreous Mild Vitreous syneresis, Posterior vitreous detachment, Weiss ring Vitreous syneresis, Posterior vitreous detachment       Fundus Exam      Right Left   Disc Normal  Normal   C/D Ratio 0.4 0.4   Macula Nasal Epiretinal membrane with stria  Good foveal reflex, No heme or edema   Vessels Normal, with straightening into macula Normal   Periphery Attached, small hole with good laser surrounding at 1100  Attached, VR tuft at 130, small horseshoe tear at 0900 - early laser changes surrounding both lesions          IMAGING AND PROCEDURES  Imaging and Procedures for 10/25/17           ASSESSMENT/PLAN:    ICD-10-CM   1. Retinal tear of left eye H33.312   2. Posterior vitreous detachment of both eyes H43.813   3. History of repair of retinal tear by laser photocoagulation Z98.890   4. Epiretinal membrane (ERM) of right eye H35.371   5. 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 Retinal edema H35.81   7. Combined forms of age-related cataract of both eyes H25.813     1,2. Acute PVD with retinal tear, OS   - small horseshoe tear located at 0900 -- no SRF - S/P laser retinopexy OS (03.11.19) - good early laser changes - no new tears or detachment - f/u in 3 wks  3. History of retinal tear OD - s/p laser retinopexy with Dr. Zigmund Daniel - stable  4,5. Epiretinal membrane w/ lamellar macular hole, OD The natural history, anatomy, potential for loss of vision, and treatment options including vitrectomy techniques and the complications of endophthalmitis, retinal detachment, vitreous hemorrhage, cataract progression and permanent vision loss discussed with the patient. - BCVA 20/25 - mild interval thickening of ERM from prior OCT done 11/2016 - monitor  6. No retinal edema on exam or OCT  7. Combined form age-related cataract OU-  - The symptoms of cataract, surgical options, and treatments and risks were discussed with patient. - discussed diagnosis and progression - not yet visually significant - monitor for now   Ophthalmic Meds Ordered this visit:  No orders of the defined types were placed in this encounter.      Return in about 3  weeks (around 11/15/2017) for F/U post laser retinopexy OS.  There are no Patient Instructions on file for this visit.   Explained the diagnoses, plan, and follow up with the patient and they expressed understanding.  Patient expressed understanding of the importance of proper follow up care.   This document serves as a record of services personally performed by Gardiner Sleeper, MD, PhD. It was created on their behalf by Catha Brow, [place], a certified ophthalmic assistant. The creation of this record is the provider's dictation and/or activities during the visit.  Electronically signed by: Catha Brow, [place]  10/25/17 11:56 PM   Gardiner Sleeper, M.D., Ph.D. Diseases & Surgery of the Retina and [place] 10/25/17  I have reviewed the above documentation for accuracy and completeness, and I agree with the above. Gardiner Sleeper, M.D., Ph.D. 10/25/17 11:59 PM    Abbreviations: M myopia (nearsighted); A astigmatism; H hyperopia (farsighted); P presbyopia; Mrx spectacle prescription;  CTL contact lenses; OD right eye; OS left eye; OU both eyes  XT exotropia; ET esotropia; PEK punctate epithelial keratitis; PEE punctate epithelial erosions; DES dry eye syndrome; MGD meibomian gland dysfunction; ATs artificial tears; PFAT's preservative free artificial tears; [place] nuclear sclerotic cataract; PSC posterior subcapsular cataract; ERM epi-retinal membrane; PVD posterior vitreous detachment; RD retinal detachment; DM diabetes mellitus; DR diabetic retinopathy; NPDR non-proliferative diabetic retinopathy; PDR proliferative diabetic retinopathy; CSME clinically significant macular edema; DME diabetic macular edema; dbh dot blot hemorrhages; CWS cotton wool spot; POAG primary open angle glaucoma; C/D cup-to-disc ratio; HVF humphrey visual field; GVF goldmann visual field; OCT optical coherence tomography; IOP intraocular pressure; BRVO Branch  retinal vein occlusion; CRVO  central retinal vein occlusion; CRAO central retinal artery occlusion; BRAO branch retinal artery occlusion; RT retinal tear; SB scleral buckle; PPV pars plana vitrectomy; VH Vitreous hemorrhage; PRP panretinal laser photocoagulation; IVK intravitreal kenalog; VMT vitreomacular traction; MH Macular hole;  NVD neovascularization of the disc; NVE neovascularization elsewhere; AREDS age related eye disease study; ARMD age related macular degeneration; POAG primary open angle glaucoma; EBMD epithelial/anterior basement membrane dystrophy; ACIOL anterior chamber intraocular lens; IOL intraocular lens; PCIOL posterior chamber intraocular lens; Phaco/IOL phacoemulsification with intraocular lens placement; [place] photorefractive keratectomy; LASIK laser assisted in situ keratomileusis; HTN hypertension; DM diabetes mellitus; COPD chronic obstructive pulmonary disease

## 2017-10-25 ENCOUNTER — Ambulatory Visit (INDEPENDENT_AMBULATORY_CARE_PROVIDER_SITE_OTHER): Payer: Managed Care, Other (non HMO) | Admitting: Ophthalmology

## 2017-10-25 ENCOUNTER — Encounter (INDEPENDENT_AMBULATORY_CARE_PROVIDER_SITE_OTHER): Payer: Self-pay | Admitting: Ophthalmology

## 2017-10-25 DIAGNOSIS — H35341 Macular cyst, hole, or pseudohole, right eye: Secondary | ICD-10-CM

## 2017-10-25 DIAGNOSIS — H35371 Puckering of macula, right eye: Secondary | ICD-10-CM

## 2017-10-25 DIAGNOSIS — H33312 Horseshoe tear of retina without detachment, left eye: Secondary | ICD-10-CM

## 2017-10-25 DIAGNOSIS — H3581 Retinal edema: Secondary | ICD-10-CM

## 2017-10-25 DIAGNOSIS — Z9889 Other specified postprocedural states: Secondary | ICD-10-CM

## 2017-10-25 DIAGNOSIS — H25813 Combined forms of age-related cataract, bilateral: Secondary | ICD-10-CM

## 2017-10-25 DIAGNOSIS — H43813 Vitreous degeneration, bilateral: Secondary | ICD-10-CM

## 2017-10-27 ENCOUNTER — Encounter (INDEPENDENT_AMBULATORY_CARE_PROVIDER_SITE_OTHER): Payer: Managed Care, Other (non HMO) | Admitting: Ophthalmology

## 2017-11-13 ENCOUNTER — Encounter (INDEPENDENT_AMBULATORY_CARE_PROVIDER_SITE_OTHER): Payer: Managed Care, Other (non HMO) | Admitting: Ophthalmology

## 2017-11-14 NOTE — Progress Notes (Signed)
[facility] Note  11/15/2017     CHIEF COMPLAINT Patient presents for Post-op Follow-up   HISTORY OF PRESENT ILLNESS: Amanda Salazar is a 57 y.o. female who presents to the clinic today for:   HPI    Post-op Follow-up    In left eye.  Discomfort includes floaters.  Negative for pain, foreign body sensation, discharge, none, tearing and itching.  Vision is worse and is stable.  I, the attending physician,  performed the HPI with the patient and updated documentation appropriately.          Comments    57 y/o female pt here for f/u s/p laser retinopexy os.  Vision still "bad" ou, but no worse since last visit.  Denies pain, but c/o floaters ou, and sees a flash in her left temporal vision occasionally while driving at night.  Restasis bid ou.       Last edited by Cherrie Gauze, COA on 11/15/2017  4:17 PM. (History)    Pt states she continues to have a floater; Pt states she feels she has a flash off and on OS;   Referring physician: [name], MD [address] [place], [place] [address]  HISTORICAL INFORMATION:   Selected notes from the MEDICAL RECORD NUMBER Referred by Dr. Zigmund Daniel for possible RD LEE- 04.27.18 (JDM) [BCVA OD: 20/20-1 OS: 20/20-1] Ocular Hx-  PMH-     CURRENT MEDICATIONS: Current Outpatient Medications (Ophthalmic Drugs)  Medication Sig  . cycloSPORINE (RESTASIS) 0.05 % ophthalmic emulsion Place 1 drop into both eyes 2 (two) times daily.   No current facility-administered medications for this visit.  (Ophthalmic Drugs)   Current Outpatient Medications (Other)  Medication Sig  . ALPRAZolam (XANAX) 0.5 MG tablet 1/2-1 pill as needed daily for anxiety  . amphetamine-dextroamphetamine (ADDERALL) 10 MG tablet Take 1 tablet (10 mg total) by mouth 2 (two) times daily with a meal.  . cetirizine (ZYRTEC) 10 MG tablet Take 10 mg by mouth daily.  . Cholecalciferol (VITAMIN D3 MAXIMUM STRENGTH) 5000 UNITS  capsule Take 5,000 Units by mouth daily.  . clotrimazole-betamethasone (LOTRISONE) cream Apply 1 application topically 2 (two) times daily.  . Cyanocobalamin (VITAMIN B 12 PO) Take by mouth daily.  . cyclobenzaprine (FLEXERIL) 10 MG tablet Take 1 tablet (10 mg total) by mouth at bedtime as needed for muscle spasms (jaw pain).  Marland Kitchen escitalopram (LEXAPRO) 10 MG tablet Take 1 tablet (10 mg total) by mouth daily.  Marland Kitchen Hyoscyamine Sulfate SL (LEVSIN/SL) 0.125 MG SUBL Place 0.125 mg under the tongue every 4 (four) hours as needed.  . nabumetone (RELAFEN) 500 MG tablet Take 1 tablet (500 mg total) by mouth 2 (two) times daily as needed for moderate pain (avoid with alcohol).  . Omega-3 Fatty Acids (FISH OIL) 1200 MG CAPS Take 1,200 mg by mouth daily.  Marland Kitchen OVER THE COUNTER MEDICATION Turmeric daily  . promethazine (PHENERGAN) 25 MG tablet Take 1 tablet (25 mg total) by mouth every 6 (six) hours as needed for nausea or vomiting (can cause fatigue). Max: 4 tablets per day  . triamcinolone (NASACORT ALLERGY 24HR) 55 MCG/ACT AERO nasal inhaler Place 2 sprays into the nose daily.   No current facility-administered medications for this visit.  (Other)      REVIEW OF SYSTEMS: ROS    Positive for: Eyes   Negative for: Constitutional, Gastrointestinal, Neurological, Skin, Genitourinary, Musculoskeletal, HENT, Endocrine, Cardiovascular, Respiratory, Psychiatric, Allergic/Imm, Heme/Lymph   Last edited by Estill Bakes  G, COA on 11/15/2017  3:49 PM. (History)       ALLERGIES Allergies  Allergen Reactions  . Dye Fdc Red [Red Dye] Anaphylaxis  . Iodinated Diagnostic Agents Anaphylaxis  . Erythromycin Nausea And Vomiting    PAST MEDICAL HISTORY Past Medical History:  Diagnosis Date  . ADD (attention deficit disorder)   . Blurred vision   . 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 Seasonal allergies    Past Surgical History:  Procedure Laterality Date  . BREAST SURGERY     biopsy x 3  . CESAREAN SECTION     x 4   . EYE SURGERY      FAMILY HISTORY Family History  Problem Relation Age of Onset  . Hypertension Mother     SOCIAL HISTORY Social History   Tobacco Use  . Smoking status: Never Smoker  . Smokeless tobacco: Never Used  Substance Use Topics  . Alcohol use: Yes    Comment: glass wine most nights  . Drug use: No         OPHTHALMIC EXAM:  Base Eye Exam    Visual Acuity (Snellen - Linear)      Right Left   Dist [place] 20/50 20/25   Dist ph [place] 20/40 -2        Tonometry (Tonopen, 3:48 PM)      Right Left   Pressure 16 17       Pupils      Dark Light Shape React APD   Right 4 3 Round Brisk None   Left 4 3 Round Brisk None       Visual Fields (Counting fingers)      Left Right    Full Full       Extraocular Movement      Right Left    Full, Ortho Full, Ortho       Neuro/Psych    Oriented x3:  Yes   Mood/Affect:  Normal       Dilation    Both eyes:  1.0% Mydriacyl, 2.5% Phenylephrine @ 3:49 PM        Slit Lamp and Fundus Exam    Slit Lamp Exam      Right Left   Lids/Lashes Normal Normal   Conjunctiva/Sclera White and quiet White and quiet   Cornea Arcus, 4 RK scars at 1200, 0300, 0600, and 0900 Arcus, Inferior 2+ Punctate epithelial erosions, 4 RK scars at 1100, 0200, 0500, 0800   Anterior Chamber Deep and quiet Deep and quiet   Iris Round and dilated Round and dilated   Lens 2+ Nuclear sclerosis, 2+ Cortical cataract 2+ Nuclear sclerosis, 2+ Cortical cataract   Vitreous Mild Vitreous syneresis, Posterior vitreous detachment, Weiss ring Vitreous syneresis, Posterior vitreous detachment       Fundus Exam      Right Left   Disc Normal Normal   C/D Ratio 0.3 0.3   Macula Nasal Epiretinal membrane with stria  Good foveal reflex, No heme or edema   Vessels Normal, with straightening into macula Mild AV crossing changes   Periphery Attached, small hole with good laser surrounding at 1100 Attached, VR tuft at 1030, small horseshoe tear at 0900 - good laser  changes surrounding both lesions          IMAGING AND PROCEDURES  Imaging and Procedures for 11/17/17  OCT, Retina - OU - Both Eyes       Right Eye Quality was good. Central Foveal Thickness: 372. Progression has  been stable. Findings include macular pucker, epiretinal membrane, abnormal foveal contour, no IRF, no SRF, lamellar hole (Nasal ERM - slight interval thickening, vertically oriented lamellar hole, Trace cystic change).   Left Eye Quality was good. Central Foveal Thickness: 247. Progression has been stable. Findings include normal foveal contour, no IRF, no SRF.   Notes *Images captured and stored on drive  Diagnosis / Impression:  OD: ERM w/ vertically oriented lamellar hole -- mild interval thickening of ERM from April 2018 OS: NFP, No IRF/SRF  Clinical management:  See below  Abbreviations: NFP - Normal foveal profile. CME - cystoid macular edema. PED - pigment epithelial detachment. IRF - intraretinal fluid. SRF - subretinal fluid. EZ - ellipsoid zone. ERM - epiretinal membrane. ORA - outer retinal atrophy. ORT - outer retinal tubulation. SRHM - subretinal hyper-reflective material                  ASSESSMENT/PLAN:    ICD-10-CM   1. Retinal tear of left eye H33.312   2. Posterior vitreous detachment of both eyes H43.813   3. History of repair of retinal tear by laser photocoagulation Z98.890   4. Epiretinal membrane (ERM) of right eye H35.371   5. 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 Retinal edema H35.81 OCT, Retina - OU - Both Eyes  7. Combined forms of age-related cataract of both eyes H25.813     1,2. Acute PVD with retinal tear, OS   - small horseshoe tear located at 0900 -- no SRF - S/P laser retinopexy OS (03.11.19) - good laser changes - no new tears or detachment on repeat 360 peripheral exam - f/u in 4 months  3. History of retinal tear OD - s/p laser retinopexy with Dr. Zigmund Daniel - stable - continue to monitor  4,5.  Epiretinal membrane w/ lamellar macular hole, OD The natural history, anatomy, potential for loss of vision, and treatment options including vitrectomy techniques and the complications of endophthalmitis, retinal detachment, vitreous hemorrhage, cataract progression and permanent vision loss discussed with the patient. - BCVA 20/25 - mild interval thickening of ERM from prior OCT done 11/2016 - stable today - no indication for surgery at this time - continue to monitor  6. No retinal edema on exam or OCT  7. Combined form age-related cataract OU-  - The symptoms of cataract, surgical options, and treatments and risks were discussed with patient. - discussed diagnosis and progression - not yet visually significant - continue to monitor for now   Ophthalmic Meds Ordered this visit:  No orders of the defined types were placed in this encounter.      Return in about 4 months (around 03/17/2018) for F/U PVD with RT OS, Dilated exam, OCT.  There are no Patient Instructions on file for this visit.   Explained the diagnoses, plan, and follow up with the patient and they expressed understanding.  Patient expressed understanding of the importance of proper follow up care.   This document serves as a record of services personally performed by Gardiner Sleeper, MD, PhD. It was created on their behalf by Catha Brow, [place], a certified ophthalmic assistant. The creation of this record is the provider's dictation and/or activities during the visit.  Electronically signed by: Catha Brow, [place]  11/17/17 12:37 PM   Gardiner Sleeper, M.D., Ph.D. Diseases & Surgery of the Retina and [place] 11/17/17  I have reviewed the above documentation for accuracy and completeness, and  I agree with the above. Gardiner Sleeper, M.D., Ph.D. 11/17/17 12:38 PM     Abbreviations: M myopia (nearsighted); A astigmatism; H hyperopia (farsighted); P presbyopia; Mrx spectacle  prescription;  CTL contact lenses; OD right eye; OS left eye; OU both eyes  XT exotropia; ET esotropia; PEK punctate epithelial keratitis; PEE punctate epithelial erosions; DES dry eye syndrome; MGD meibomian gland dysfunction; ATs artificial tears; PFAT's preservative free artificial tears; [place] nuclear sclerotic cataract; PSC posterior subcapsular cataract; ERM epi-retinal membrane; PVD posterior vitreous detachment; RD retinal detachment; DM diabetes mellitus; DR diabetic retinopathy; NPDR non-proliferative diabetic retinopathy; PDR proliferative diabetic retinopathy; CSME clinically significant macular edema; DME diabetic macular edema; dbh dot blot hemorrhages; CWS cotton wool spot; POAG primary open angle glaucoma; C/D cup-to-disc ratio; HVF humphrey visual field; GVF goldmann visual field; OCT optical coherence tomography; IOP intraocular pressure; BRVO Branch retinal vein occlusion; CRVO central retinal vein occlusion; CRAO central retinal artery occlusion; BRAO branch retinal artery occlusion; RT retinal tear; SB scleral buckle; PPV pars plana vitrectomy; VH Vitreous hemorrhage; PRP panretinal laser photocoagulation; IVK intravitreal kenalog; VMT vitreomacular traction; MH Macular hole;  NVD neovascularization of the disc; NVE neovascularization elsewhere; AREDS age related eye disease study; ARMD age related macular degeneration; POAG primary open angle glaucoma; EBMD epithelial/anterior basement membrane dystrophy; ACIOL anterior chamber intraocular lens; IOL intraocular lens; PCIOL posterior chamber intraocular lens; Phaco/IOL phacoemulsification with intraocular lens placement; [place] photorefractive keratectomy; LASIK laser assisted in situ keratomileusis; HTN hypertension; DM diabetes mellitus; COPD chronic obstructive pulmonary disease

## 2017-11-15 ENCOUNTER — Encounter (INDEPENDENT_AMBULATORY_CARE_PROVIDER_SITE_OTHER): Payer: Self-pay | Admitting: Ophthalmology

## 2017-11-15 ENCOUNTER — Ambulatory Visit (INDEPENDENT_AMBULATORY_CARE_PROVIDER_SITE_OTHER): Payer: Managed Care, Other (non HMO) | Admitting: Ophthalmology

## 2017-11-15 DIAGNOSIS — H33312 Horseshoe tear of retina without detachment, left eye: Secondary | ICD-10-CM

## 2017-11-15 DIAGNOSIS — H35371 Puckering of macula, right eye: Secondary | ICD-10-CM

## 2017-11-15 DIAGNOSIS — H43813 Vitreous degeneration, bilateral: Secondary | ICD-10-CM

## 2017-11-15 DIAGNOSIS — Z9889 Other specified postprocedural states: Secondary | ICD-10-CM

## 2017-11-15 DIAGNOSIS — H3581 Retinal edema: Secondary | ICD-10-CM

## 2017-11-15 DIAGNOSIS — H25813 Combined forms of age-related cataract, bilateral: Secondary | ICD-10-CM

## 2017-11-15 DIAGNOSIS — H35341 Macular cyst, hole, or pseudohole, right eye: Secondary | ICD-10-CM

## 2017-11-17 ENCOUNTER — Encounter (INDEPENDENT_AMBULATORY_CARE_PROVIDER_SITE_OTHER): Payer: Self-pay | Admitting: Ophthalmology

## 2017-12-13 ENCOUNTER — Other Ambulatory Visit: Payer: Self-pay | Admitting: Physician Assistant

## 2018-02-09 ENCOUNTER — Other Ambulatory Visit: Payer: Self-pay | Admitting: Internal Medicine

## 2018-02-09 DIAGNOSIS — Z1231 Encounter for screening mammogram for malignant neoplasm of breast: Secondary | ICD-10-CM

## 2018-02-19 ENCOUNTER — Ambulatory Visit: Payer: Self-pay | Admitting: Adult Health

## 2018-02-23 NOTE — Progress Notes (Signed)
[facility] Note  02/26/2018     CHIEF COMPLAINT Patient presents for Retina Follow Up   HISTORY OF PRESENT ILLNESS: Amanda Salazar is a 57 y.o. female who presents to the clinic today for:   HPI    Retina Follow Up    Patient presents with  PVD.  In left eye.  This started 4 months ago.  Severity is mild.  I, the attending physician,  performed the HPI with the patient and updated documentation appropriately.          Comments    F/U PVD with RT OS. Patient states the floaters have increased os, she has flashes of light while driving at night, she feels her vision has decreased OU.        Last edited by [name], MD on 02/26/2018  4:46 PM. (History)       Referring physician: [name], MD [address] [place], [place] [address]  HISTORICAL INFORMATION:   Selected notes from the MEDICAL RECORD NUMBER Referred by Dr. Zigmund Daniel for possible RD LEE- 04.27.18 (JDM) [BCVA OD: 20/20-1 OS: 20/20-1] Ocular Hx-  PMH-     CURRENT MEDICATIONS: Current Outpatient Medications (Ophthalmic Drugs)  Medication Sig  . cycloSPORINE (RESTASIS) 0.05 % ophthalmic emulsion Place 1 drop into both eyes 2 (two) times daily.   No current facility-administered medications for this visit.  (Ophthalmic Drugs)   Current Outpatient Medications (Other)  Medication Sig  . ALPRAZolam (XANAX) 0.5 MG tablet 1/2-1 pill as needed daily for anxiety  . amphetamine-dextroamphetamine (ADDERALL) 10 MG tablet Take 1 tablet (10 mg total) by mouth 2 (two) times daily with a meal.  . cetirizine (ZYRTEC) 10 MG tablet Take 10 mg by mouth daily.  . Cholecalciferol (VITAMIN D3 MAXIMUM STRENGTH) 5000 UNITS capsule Take 5,000 Units by mouth daily.  . clotrimazole-betamethasone (LOTRISONE) cream Apply 1 application topically 2 (two) times daily.  . Cyanocobalamin (VITAMIN B 12 PO) Take by mouth daily.  . cyclobenzaprine (FLEXERIL) 10 MG tablet Take 1 tablet (10  mg total) by mouth at bedtime as needed for muscle spasms (jaw pain).  Marland Kitchen escitalopram (LEXAPRO) 10 MG tablet Take 1 tablet (10 mg total) by mouth daily.  Marland Kitchen Hyoscyamine Sulfate SL (LEVSIN/SL) 0.125 MG SUBL Place 0.125 mg under the tongue every 4 (four) hours as needed.  . nabumetone (RELAFEN) 500 MG tablet TAKE 1 TABLET TWICE A DAY AS NEEDED FOR MODERATE PAIN (AVOID WITH ALCOHOL)  . Omega-3 Fatty Acids (FISH OIL) 1200 MG CAPS Take 1,200 mg by mouth daily.  Marland Kitchen OVER THE COUNTER MEDICATION Turmeric daily  . promethazine (PHENERGAN) 25 MG tablet Take 1 tablet (25 mg total) by mouth every 6 (six) hours as needed for nausea or vomiting (can cause fatigue). Max: 4 tablets per day  . triamcinolone (NASACORT ALLERGY 24HR) 55 MCG/ACT AERO nasal inhaler Place 2 sprays into the nose daily.   No current facility-administered medications for this visit.  (Other)      REVIEW OF SYSTEMS: ROS    Negative for: Constitutional, Gastrointestinal, Neurological, Skin, Genitourinary, Musculoskeletal, HENT, Endocrine, Cardiovascular, Eyes, Respiratory, Psychiatric, Allergic/Imm, Heme/Lymph   Last edited by Zenovia Jordan, LPN on 3/64/6803  2:12 PM. (History)       ALLERGIES Allergies  Allergen Reactions  . Dye Fdc Red [Red Dye] Anaphylaxis  . Iodinated Diagnostic Agents Anaphylaxis  . Erythromycin Nausea And Vomiting    PAST MEDICAL HISTORY Past Medical History:  Diagnosis Date  . ADD (  attention deficit disorder)   . Blurred vision   . 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 Seasonal allergies    Past Surgical History:  Procedure Laterality Date  . BREAST SURGERY     biopsy x 3  . CESAREAN SECTION     x 4  . EYE SURGERY      FAMILY HISTORY Family History  Problem Relation Age of Onset  . Hypertension Mother     SOCIAL HISTORY Social History   Tobacco Use  . Smoking status: Never Smoker  . Smokeless tobacco: Never Used  Substance Use Topics  . Alcohol use: Yes    Comment: glass wine most nights   . Drug use: No         OPHTHALMIC EXAM:  Base Eye Exam    Visual Acuity (Snellen - Linear)      Right Left   Dist [place] 20/50 20/25   Dist ph  20/40 NI       Tonometry (Tonopen, 3:08 PM)      Right Left   Pressure 16 15       Pupils      Dark Light Shape React APD   Right 4 3 Round Brisk None   Left 4 3 Round Brisk None       Visual Fields (Counting fingers)      Left Right    Full Full       Extraocular Movement      Right Left    Full, Ortho Full, Ortho       Neuro/Psych    Oriented x3:  Yes   Mood/Affect:  Normal       Dilation    Both eyes:  1.0% Mydriacyl, 2.5% Phenylephrine @ 3:08 PM        Slit Lamp and Fundus Exam    Slit Lamp Exam      Right Left   Lids/Lashes Mild Meibomian gland dysfunction, mild Telangiectasia Mild Meibomian gland dysfunction, mild Telangiectasia   Conjunctiva/Sclera White and quiet White and quiet   Cornea Arcus, 4 RK scars at 1200, 0300, 0600, and 0900 Arcus, Inferior 2+ Punctate epithelial erosions, 4 RK scars at 1100, 0200, 0500, 0800   Anterior Chamber Deep and quiet Deep and quiet   Iris Round and dilated Round and dilated   Lens 2+ Nuclear sclerosis, 2+ Cortical cataract 2+ Nuclear sclerosis, 2+ Cortical cataract   Vitreous Mild Vitreous syneresis, Posterior vitreous detachment, Weiss ring Vitreous syneresis, Posterior vitreous detachment       Fundus Exam      Right Left   Disc Normal Normal   C/D Ratio 0.3 0.3   Macula Blunted foveal reflex, Nasal Epiretinal membrane with pucker and stria  Good foveal reflex, Retinal pigment epithelial mottling, No heme or edema   Vessels Normal, with straightening into macula Mild AV crossing changes   Periphery Attached, small hole with good laser surrounding at 1100 Attached, VR tuft at 1030, small horseshoe tear at 0900 - good laser changes surrounding both lesions          IMAGING AND PROCEDURES  Imaging and Procedures for 11/17/17  OCT, Retina - OU - Both Eyes        Right Eye Quality was good. Central Foveal Thickness: 373. Progression has been stable. Findings include macular pucker, epiretinal membrane, abnormal foveal contour, no IRF, no SRF (Nasal ERM - slight interval thickening, vertically oriented lamellar hole, Trace cystic change).   Left Eye Quality was good. Central  Foveal Thickness: 246. Progression has been stable. Findings include normal foveal contour, no IRF, no SRF.   Notes *Images captured and stored on drive  Diagnosis / Impression:  OD: ERM w/ pucker - stable from prior OS: NFP, No IRF/SRF  Clinical management:  See below  Abbreviations: NFP - Normal foveal profile. CME - cystoid macular edema. PED - pigment epithelial detachment. IRF - intraretinal fluid. SRF - subretinal fluid. EZ - ellipsoid zone. ERM - epiretinal membrane. ORA - outer retinal atrophy. ORT - outer retinal tubulation. SRHM - subretinal hyper-reflective material                  ASSESSMENT/PLAN:    ICD-10-CM   1. Retinal tear of left eye H33.312   2. Posterior vitreous detachment of both eyes H43.813   3. History of repair of retinal tear by laser photocoagulation Z98.890   4. Epiretinal membrane (ERM) of right eye H35.371   5. 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 Retinal edema H35.81 OCT, Retina - OU - Both Eyes  7. Combined forms of age-related cataract of both eyes H25.813     1,2. Acute PVD with retinal tear, OS   - small horseshoe tear located at 0900 -- no SRF - S/P laser retinopexy OS (03.11.19) - good laser changes - no new tears or detachment on repeat 360 peripheral exam - f/u 6 months  3. History of retinal tear OD - s/p laser retinopexy with Dr. Zigmund Daniel - stable - continue to monitor  4,5. Epiretinal membrane w/ lamellar macular hole, OD The natural history, anatomy, potential for loss of vision, and treatment options including vitrectomy techniques and the complications of endophthalmitis, retinal detachment,  vitreous hemorrhage, cataract progression and permanent vision loss discussed with the patient. - BCVA 20/40 stable - mild interval thickening of ERM from prior OCT done 11/2016 - stable today - no indication for surgery at this time - continue to monitor  6. No retinal edema on exam or OCT  7. Combined form age-related cataract OU-  - The symptoms of cataract, surgical options, and treatments and risks were discussed with patient. - discussed diagnosis and progression - not yet visually significant - continue to monitor for now   Ophthalmic Meds Ordered this visit:  No orders of the defined types were placed in this encounter.      Return in about 6 months (around 08/29/2018) for F/U ERM OD, DFE, OCT.  There are no Patient Instructions on file for this visit.   Explained the diagnoses, plan, and follow up with the patient and they expressed understanding.  Patient expressed understanding of the importance of proper follow up care.   This document serves as a record of services personally performed by Gardiner Sleeper, MD, PhD. It was created on their behalf by Ernest Mallick, OA, an ophthalmic assistant. The creation of this record is the provider's dictation and/or activities during the visit.    Electronically signed by: Ernest Mallick, OA  07.19.2019 4:46 PM    Gardiner Sleeper, M.D., Ph.D. Diseases & Surgery of the Retina and [place] 02/26/18   I have reviewed the above documentation for accuracy and completeness, and I agree with the above. Gardiner Sleeper, M.D., Ph.D. 02/26/18 4:46 PM   Abbreviations: M myopia (nearsighted); A astigmatism; H hyperopia (farsighted); P presbyopia; Mrx spectacle prescription;  CTL contact lenses; OD right eye; OS left eye; OU both eyes  XT exotropia; ET  esotropia; PEK punctate epithelial keratitis; PEE punctate epithelial erosions; DES dry eye syndrome; MGD meibomian gland dysfunction; ATs artificial tears;  PFAT's preservative free artificial tears; [place] nuclear sclerotic cataract; PSC posterior subcapsular cataract; ERM epi-retinal membrane; PVD posterior vitreous detachment; RD retinal detachment; DM diabetes mellitus; DR diabetic retinopathy; NPDR non-proliferative diabetic retinopathy; PDR proliferative diabetic retinopathy; CSME clinically significant macular edema; DME diabetic macular edema; dbh dot blot hemorrhages; CWS cotton wool spot; POAG primary open angle glaucoma; C/D cup-to-disc ratio; HVF humphrey visual field; GVF goldmann visual field; OCT optical coherence tomography; IOP intraocular pressure; BRVO Branch retinal vein occlusion; CRVO central retinal vein occlusion; CRAO central retinal artery occlusion; BRAO branch retinal artery occlusion; RT retinal tear; SB scleral buckle; PPV pars plana vitrectomy; VH Vitreous hemorrhage; PRP panretinal laser photocoagulation; IVK intravitreal kenalog; VMT vitreomacular traction; MH Macular hole;  NVD neovascularization of the disc; NVE neovascularization elsewhere; AREDS age related eye disease study; ARMD age related macular degeneration; POAG primary open angle glaucoma; EBMD epithelial/anterior basement membrane dystrophy; ACIOL anterior chamber intraocular lens; IOL intraocular lens; PCIOL posterior chamber intraocular lens; Phaco/IOL phacoemulsification with intraocular lens placement; [place] photorefractive keratectomy; LASIK laser assisted in situ keratomileusis; HTN hypertension; DM diabetes mellitus; COPD chronic obstructive pulmonary disease

## 2018-02-26 ENCOUNTER — Encounter (INDEPENDENT_AMBULATORY_CARE_PROVIDER_SITE_OTHER): Payer: Self-pay | Admitting: Ophthalmology

## 2018-02-26 ENCOUNTER — Ambulatory Visit (INDEPENDENT_AMBULATORY_CARE_PROVIDER_SITE_OTHER): Payer: Managed Care, Other (non HMO) | Admitting: Ophthalmology

## 2018-02-26 DIAGNOSIS — H35371 Puckering of macula, right eye: Secondary | ICD-10-CM | POA: Diagnosis not present

## 2018-02-26 DIAGNOSIS — H3581 Retinal edema: Secondary | ICD-10-CM | POA: Diagnosis not present

## 2018-02-26 DIAGNOSIS — Z9889 Other specified postprocedural states: Secondary | ICD-10-CM | POA: Diagnosis not present

## 2018-02-26 DIAGNOSIS — H25813 Combined forms of age-related cataract, bilateral: Secondary | ICD-10-CM

## 2018-02-26 DIAGNOSIS — H43813 Vitreous degeneration, bilateral: Secondary | ICD-10-CM | POA: Diagnosis not present

## 2018-02-26 DIAGNOSIS — H33312 Horseshoe tear of retina without detachment, left eye: Secondary | ICD-10-CM | POA: Diagnosis not present

## 2018-02-26 DIAGNOSIS — H35341 Macular cyst, hole, or pseudohole, right eye: Secondary | ICD-10-CM

## 2018-03-05 ENCOUNTER — Ambulatory Visit
Admission: RE | Admit: 2018-03-05 | Discharge: 2018-03-05 | Disposition: A | Discharge status: Home / Self Care | Payer: Managed Care, Other (non HMO) | Source: Ambulatory Visit | Attending: Internal Medicine | Admitting: Internal Medicine

## 2018-03-05 DIAGNOSIS — Z1231 Encounter for screening mammogram for malignant neoplasm of breast: Secondary | ICD-10-CM

## 2018-03-20 ENCOUNTER — Ambulatory Visit: Payer: Self-pay | Admitting: Physician Assistant

## 2018-03-27 ENCOUNTER — Other Ambulatory Visit: Payer: Self-pay | Admitting: Physician Assistant

## 2018-03-28 ENCOUNTER — Other Ambulatory Visit: Payer: Self-pay | Admitting: Physician Assistant

## 2018-03-28 MED ORDER — CIPROFLOXACIN HCL 500 MG PO TABS: 500.0000 mg | ORAL_TABLET | Freq: Two times a day (BID) | ORAL | 0 refills | Status: DC

## 2018-03-28 MED ORDER — HYOSCYAMINE SULFATE SL 0.125 MG SL SUBL: 0.1250 mg | SUBLINGUAL_TABLET | SUBLINGUAL | 0 refills | Status: DC | PRN

## 2018-03-28 MED ORDER — CETIRIZINE HCL 10 MG PO TABS: 10.0000 mg | ORAL_TABLET | Freq: Every day | ORAL | 1 refills | Status: DC

## 2018-03-28 MED ORDER — PROMETHAZINE HCL 25 MG PO TABS: 25.0000 mg | ORAL_TABLET | Freq: Four times a day (QID) | ORAL | 0 refills | Status: DC | PRN

## 2018-05-08 ENCOUNTER — Other Ambulatory Visit: Payer: Self-pay | Admitting: Physician Assistant

## 2018-05-08 DIAGNOSIS — F411 Generalized anxiety disorder: Secondary | ICD-10-CM

## 2018-07-03 ENCOUNTER — Encounter: Payer: Self-pay | Admitting: Adult Health

## 2018-07-03 ENCOUNTER — Encounter: Payer: Self-pay | Admitting: Physician Assistant

## 2018-07-14 ENCOUNTER — Other Ambulatory Visit: Payer: Self-pay | Admitting: Physician Assistant

## 2018-07-14 DIAGNOSIS — F411 Generalized anxiety disorder: Secondary | ICD-10-CM

## 2018-07-24 NOTE — Progress Notes (Signed)
[facility] Note  07/25/2018     CHIEF COMPLAINT Patient presents for Retina Follow Up   HISTORY OF PRESENT ILLNESS: Amanda Salazar is a 57 y.o. female who presents to the clinic today for:   HPI    Retina Follow Up    Patient presents with  PVD.  In left eye.  This started 5 months ago.  Severity is moderate.  Duration of 5 months.  Since onset it is stable.  I, the attending physician,  performed the HPI with the patient and updated documentation appropriately.          Comments    Patient here for 5 months retinal eval for acute PVD with retinal tear OS (s/p laser 03/11) Patient stated Vision not good. Struggles with floaters. Has had tears before worries that is what is getting worse.       Last edited by Amanda Caffey, MD on 07/25/2018  2:17 PM. (History)     pt states she feels her vision is not that great, she states it seems like her floaters have gotten worse in her left eye, she states it seems like they are worse when she wakes up or when she is at work on the computer, she states she feel like her right eye has also gotten worse  Referring physician: [name], MD [address], [place] [address]  HISTORICAL INFORMATION:   Selected notes from the [facility] Referred by Amanda Salazar for possible RD LEE- 04.27.18 (Amanda Salazar) [BCVA OD: 20/20-1 OS: 20/20-1] Ocular Hx-  PMH-     CURRENT MEDICATIONS: Current Outpatient Medications (Ophthalmic Drugs)  Medication Sig  . cycloSPORINE (RESTASIS) 0.05 % ophthalmic emulsion Place 1 drop into both eyes 2 (two) times daily.   No current facility-administered medications for this visit.  (Ophthalmic Drugs)   Current Outpatient Medications (Other)  Medication Sig  . ALPRAZolam (XANAX) 0.5 MG tablet 1/2-1 pill as needed daily for anxiety  . amphetamine-dextroamphetamine (ADDERALL) 10 MG tablet Take 1 tablet (10 mg total) by mouth 2 (two) times daily with a meal.   . cetirizine (ZYRTEC) 10 MG tablet Take 1 tablet (10 mg total) by mouth daily.  . Cholecalciferol (VITAMIN D3 MAXIMUM STRENGTH) 5000 UNITS capsule Take 5,000 Units by mouth daily.  . ciprofloxacin (CIPRO) 500 MG tablet Take 1 tablet (500 mg total) by mouth 2 (two) times daily.  . clotrimazole-betamethasone (LOTRISONE) cream APPLY TO AFFECTED AREA TWICE A DAY  . Cyanocobalamin (VITAMIN B 12 PO) Take by mouth daily.  . cyclobenzaprine (FLEXERIL) 10 MG tablet Take 1 tablet (10 mg total) by mouth at bedtime as needed for muscle spasms (jaw pain).  Marland Kitchen escitalopram (LEXAPRO) 10 MG tablet TAKE 1 TABLET BY MOUTH EVERY DAY  . Hyoscyamine Sulfate SL (LEVSIN/SL) 0.125 MG SUBL Place 0.125 mg under the tongue every 4 (four) hours as needed.  . nabumetone (RELAFEN) 500 MG tablet TAKE 1 TABLET TWICE A DAY AS NEEDED FOR MODERATE PAIN (AVOID WITH ALCOHOL)  . Omega-3 Fatty Acids (FISH OIL) 1200 MG CAPS Take 1,200 mg by mouth daily.  Marland Kitchen OVER THE COUNTER MEDICATION Turmeric daily  . promethazine (PHENERGAN) 25 MG tablet Take 1 tablet (25 mg total) by mouth every 6 (six) hours as needed for nausea or vomiting (can cause fatigue). Max: 4 tablets per day  . triamcinolone (NASACORT ALLERGY 24HR) 55 MCG/ACT AERO nasal inhaler Place 2 sprays into the nose daily.   No current facility-administered  medications for this visit.  (Other)      REVIEW OF SYSTEMS: ROS    Positive for: Eyes   Negative for: Constitutional, Gastrointestinal, Neurological, Skin, Genitourinary, Musculoskeletal, HENT, Endocrine, Cardiovascular, Respiratory, Psychiatric, Allergic/Imm, Heme/Lymph   Last edited by Amanda Caffey, MD on 07/25/2018  2:17 PM. (History)       ALLERGIES Allergies  Allergen Reactions  . Dye Fdc Red [Red Dye] Anaphylaxis  . Iodinated Diagnostic Agents Anaphylaxis  . Erythromycin Nausea And Vomiting    PAST MEDICAL HISTORY Past Medical History:  Diagnosis Date  . ADD (attention deficit disorder)   . Blurred  vision   . 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 Seasonal allergies    Past Surgical History:  Procedure Laterality Date  . BREAST EXCISIONAL BIOPSY Bilateral   . BREAST SURGERY     biopsy x 3  . CESAREAN SECTION     x 4  . EYE SURGERY      FAMILY HISTORY Family History  Problem Relation Age of Onset  . Hypertension Mother     SOCIAL HISTORY Social History   Tobacco Use  . Smoking status: Never Smoker  . Smokeless tobacco: Never Used  Substance Use Topics  . Alcohol use: Yes    Comment: glass wine most nights  . Drug use: No         OPHTHALMIC EXAM:  Base Eye Exam    Visual Acuity (Snellen - Linear)      Right Left   Dist [place] 20/50 20/25   Dist ph [place] 20/40 +2        Tonometry (Tonopen, 1:32 PM)      Right Left   Pressure 18 17       Pupils      Dark Light Shape React APD   Right 4 3 Round Brisk None   Left 4 3 Round Brisk None       Visual Fields (Counting fingers)      Left Right    Full        Extraocular Movement      Right Left    Full, Ortho Full, Ortho       Neuro/Psych    Oriented x3:  Yes   Mood/Affect:  Normal       Dilation    Both eyes:  1.0% Mydriacyl, 2.5% Phenylephrine @ 1:33 PM        Slit Lamp and Fundus Exam    Slit Lamp Exam      Right Left   Lids/Lashes Mild Meibomian gland dysfunction, mild Telangiectasia Mild Meibomian gland dysfunction, mild Telangiectasia   Conjunctiva/Sclera White and quiet White and quiet   Cornea Arcus, 4 RK scars at 1200, 0300, 0600, and 0900, 1+ Punctate epithelial erosions Arcus, Inferior 1+ Punctate epithelial erosions, 4 RK scars at 1100, 0200, 0500, 0800   Anterior Chamber Deep and quiet Deep and quiet   Iris Round and dilated Round and dilated   Lens 2+ Nuclear sclerosis, 2+ Cortical cataract 2+ Nuclear sclerosis, 2+ Cortical cataract   Vitreous Mild Vitreous syneresis, Posterior vitreous detachment, Weiss ring Vitreous syneresis, Posterior vitreous detachment, Weiss ring       Fundus Exam       Right Left   Disc Pink and Sharp Pink and Sharp   C/D Ratio 0.3 0.3   Macula Blunted foveal reflex, Nasal Epiretinal membrane with pucker and stria, No heme  Good foveal reflex, mild Retinal pigment epithelial mottling, No heme or edema  Vessels Mild Copper wiring,mild AV crossing changes Mild AV crossing changes, mild Vascular attenuation   Periphery Attached, small hole with good laser surrounding at 1100 Attached, VR tuft at 1030, small horseshoe tear at 0900 - good laser changes surrounding both lesions          IMAGING AND PROCEDURES  Imaging and Procedures for 11/17/17  OCT, Retina - OU - Both Eyes       Right Eye Quality was good. Central Foveal Thickness: 391. Progression has worsened. Findings include macular pucker, epiretinal membrane, abnormal foveal contour, no IRF, no SRF (Nasal ERM - slight interval thickening, macular pucker).   Left Eye Quality was good. Central Foveal Thickness: 247. Progression has been stable. Findings include normal foveal contour, no IRF, no SRF.   Notes *Images captured and stored on drive  Diagnosis / Impression:  OD: nasal ERM w/ pucker - mild interval thickening of retina OS: NFP, No IRF/SRF  Clinical management:  See below  Abbreviations: NFP - Normal foveal profile. CME - cystoid macular edema. PED - pigment epithelial detachment. IRF - intraretinal fluid. SRF - subretinal fluid. EZ - ellipsoid zone. ERM - epiretinal membrane. ORA - outer retinal atrophy. ORT - outer retinal tubulation. SRHM - subretinal hyper-reflective material                  ASSESSMENT/PLAN:    ICD-10-CM   1. Retinal tear of left eye H33.312   2. Posterior vitreous detachment of both eyes H43.813   3. History of repair of retinal tear by laser photocoagulation Z98.890   4. Epiretinal membrane (ERM) of right eye H35.371   5. 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 Retinal edema H35.81 OCT, Retina - OU - Both Eyes  7. Combined forms of  age-related cataract of both eyes H25.813     1,2. Acute PVD with retinal tear, OS   - small horseshoe tear located at 0900 -- no SRF - S/P laser retinopexy OS (03.11.19) - good laser changes - no new tears or detachment on repeat 360 peripheral exam - f/u 6 months  3. History of retinal tear OD - s/p laser retinopexy with Amanda Salazar - stable - continue to monitor  4,5. Epiretinal membrane, OD - The natural history, anatomy, potential for loss of vision, and treatment options including vitrectomy techniques and the complications of endophthalmitis, retinal detachment, vitreous hemorrhage, cataract progression and permanent vision loss discussed with the patient. - BCVA remains stable at 20/40 +2 - mild interval thickening of ERM from prior OCT - no indication for surgery at this time - continue to monitor  6. No retinal edema on exam or OCT  7. Combined form age-related cataract OU-  - The symptoms of cataract, surgical options, and treatments and risks were discussed with patient. - discussed diagnosis and progression - not yet visually significant - continue to monitor for now   Ophthalmic Meds Ordered this visit:  No orders of the defined types were placed in this encounter.      Return for F/U 4-6 monhts PVD with retinal tear OS, DFE, OCT.  There are no Patient Instructions on file for this visit.   Explained the diagnoses, plan, and follow up with the patient and they expressed understanding.  Patient expressed understanding of the importance of proper follow up care.   This document serves as a record of services personally performed by Gardiner Sleeper, MD, PhD. It was created on their behalf by Ernest Mallick,  OA, an ophthalmic assistant. The creation of this record is the provider's dictation and/or activities during the visit.    Electronically signed by: Ernest Mallick, OA  12.17.19 3:49 PM    Gardiner Sleeper, M.D., Ph.D. Diseases & Surgery of the Retina and  Vitreous [facility]   I have reviewed the above documentation for accuracy and completeness, and I agree with the above. Gardiner Sleeper, M.D., Ph.D. 07/25/18 3:50 PM     Abbreviations: M myopia (nearsighted); A astigmatism; H hyperopia (farsighted); P presbyopia; Mrx spectacle prescription;  CTL contact lenses; OD right eye; OS left eye; OU both eyes  XT exotropia; ET esotropia; PEK punctate epithelial keratitis; PEE punctate epithelial erosions; DES dry eye syndrome; MGD meibomian gland dysfunction; ATs artificial tears; PFAT's preservative free artificial tears; [place] nuclear sclerotic cataract; PSC posterior subcapsular cataract; ERM epi-retinal membrane; PVD posterior vitreous detachment; RD retinal detachment; DM diabetes mellitus; DR diabetic retinopathy; NPDR non-proliferative diabetic retinopathy; PDR proliferative diabetic retinopathy; CSME clinically significant macular edema; DME diabetic macular edema; dbh dot blot hemorrhages; CWS cotton wool spot; POAG primary open angle glaucoma; C/D cup-to-disc ratio; HVF humphrey visual field; GVF goldmann visual field; OCT optical coherence tomography; IOP intraocular pressure; BRVO Branch retinal vein occlusion; CRVO central retinal vein occlusion; CRAO central retinal artery occlusion; BRAO branch retinal artery occlusion; RT retinal tear; SB scleral buckle; PPV pars plana vitrectomy; VH Vitreous hemorrhage; PRP panretinal laser photocoagulation; IVK intravitreal kenalog; VMT vitreomacular traction; MH Macular hole;  NVD neovascularization of the disc; NVE neovascularization elsewhere; AREDS age related eye disease study; ARMD age related macular degeneration; POAG primary open angle glaucoma; EBMD epithelial/anterior basement membrane dystrophy; ACIOL anterior chamber intraocular lens; IOL intraocular lens; PCIOL posterior chamber intraocular lens; Phaco/IOL phacoemulsification with intraocular lens placement; [place] photorefractive  keratectomy; LASIK laser assisted in situ keratomileusis; HTN hypertension; DM diabetes mellitus; COPD chronic obstructive pulmonary disease

## 2018-07-25 ENCOUNTER — Ambulatory Visit (INDEPENDENT_AMBULATORY_CARE_PROVIDER_SITE_OTHER): Payer: Managed Care, Other (non HMO) | Admitting: Ophthalmology

## 2018-07-25 ENCOUNTER — Encounter (INDEPENDENT_AMBULATORY_CARE_PROVIDER_SITE_OTHER): Payer: Self-pay | Admitting: Ophthalmology

## 2018-07-25 DIAGNOSIS — H33312 Horseshoe tear of retina without detachment, left eye: Secondary | ICD-10-CM | POA: Diagnosis not present

## 2018-07-25 DIAGNOSIS — H43813 Vitreous degeneration, bilateral: Secondary | ICD-10-CM | POA: Diagnosis not present

## 2018-07-25 DIAGNOSIS — H25813 Combined forms of age-related cataract, bilateral: Secondary | ICD-10-CM

## 2018-07-25 DIAGNOSIS — H35341 Macular cyst, hole, or pseudohole, right eye: Secondary | ICD-10-CM

## 2018-07-25 DIAGNOSIS — H35371 Puckering of macula, right eye: Secondary | ICD-10-CM

## 2018-07-25 DIAGNOSIS — H3581 Retinal edema: Secondary | ICD-10-CM | POA: Diagnosis not present

## 2018-07-25 DIAGNOSIS — Z9889 Other specified postprocedural states: Secondary | ICD-10-CM

## 2018-08-22 ENCOUNTER — Other Ambulatory Visit: Payer: Self-pay | Admitting: Dermatology

## 2018-08-29 ENCOUNTER — Encounter (INDEPENDENT_AMBULATORY_CARE_PROVIDER_SITE_OTHER): Payer: Managed Care, Other (non HMO) | Admitting: Ophthalmology

## 2018-09-04 ENCOUNTER — Encounter: Payer: Self-pay | Admitting: Physician Assistant

## 2018-10-07 ENCOUNTER — Other Ambulatory Visit: Payer: Self-pay | Admitting: Internal Medicine

## 2018-10-07 DIAGNOSIS — F411 Generalized anxiety disorder: Secondary | ICD-10-CM

## 2018-10-15 NOTE — Progress Notes (Signed)
Complete Physical  Assessment and Plan: Hyperlipidemia -continue medications, check lipids, decrease fatty foods, increase activity.  - CBC with Differential/Platelet - BASIC METABOLIC PANEL WITH GFR - Hepatic function panel - Lipid panel - TSH - EKG 12-Lead   Dense breast tissue Get 3D MGM   Basal cell carcinoma Follow up tafeen  Generalized anxiety disorder Continue medications  Gastroesophageal reflux disease, esophagitis presence not specified Continue PPI/H2 blocker, diet discussed  Screening for blood or protein in urine - Urinalysis, Routine w reflex microscopic (not at [facility]) - Microalbumin / creatinine urine ratio  Routine general medical examination at a health care facility 1 year   Medication management - Magnesium   Vitamin D deficiency - Vit D  25 hydroxy (rtn osteoporosis monitoring)  Allergic rhinitis, unspecified seasonality, unspecified trigger -     triamcinolone (NASACORT ALLERGY 24HR) 55 MCG/ACT AERO nasal inhaler; Place 2 sprays into the nose daily.  Screening, anemia, deficiency, iron -     Vitamin B12 -     Iron,Total/Total Iron Binding Cap  Discussed med's effects and SE's. Screening labs and tests as requested with regular follow-up as recommended.  HPI 58 y.o. female  presents for a complete physical. Just got back from a cruise on the 29th.   Her blood pressure has been controlled at home, today their BP is BP: 120/68 She does not workout. She denies chest pain, shortness of breath, dizziness.  She is not on cholesterol medication and denies myalgias. Her cholesterol is at goal. The cholesterol last visit was:   Lab Results  Component Value Date   CHOL 236 (H) 08/21/2017   HDL 66 08/21/2017   LDLCALC 142 (H) 08/21/2017   LDLDIRECT 151.2 01/03/2008   TRIG 153 (H) 08/21/2017   CHOLHDL 3.6 08/21/2017   Last A1C in the office was:  Lab Results  Component Value Date   HGBA1C 4.8 06/28/2016   Patient is on Vitamin D supplement.   Lab Results  Component Value Date   VD25OH 40 08/21/2017   Lab Results  Component Value Date   VITAMINB12 1,613 (H) 04/30/2015   Lab Results  Component Value Date   IRON 153 06/28/2016   TIBC 335 06/28/2016   FERRITIN 97 06/28/2016   Works at [employer], increased stress/time but has improved. 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  Rarely takes adderall at work.  Has had feet/hand pain, has had negative work up for autoimmune and is on tumeric that is helping.  BMI is Body mass index is 23.08 kg/m., she is working on diet and exercise. Wt Readings from Last 3 Encounters:  10/16/18 126 lb 3.2 oz (57.2 kg)  08/21/17 125 lb (56.7 kg)  07/28/17 121 lb 12.8 oz (55.2 kg)     Current Medications:  Current Outpatient Medications on File Prior to Visit  Medication Sig  . ALPRAZolam (XANAX) 0.5 MG tablet 1/2-1 pill as needed daily for anxiety  . cetirizine (ZYRTEC) 10 MG tablet Take 1 tablet (10 mg total) by mouth daily.  . Cholecalciferol (VITAMIN D3 MAXIMUM STRENGTH) 5000 UNITS capsule Take 5,000 Units by mouth daily.  . clotrimazole-betamethasone (LOTRISONE) cream APPLY TO AFFECTED AREA TWICE A DAY  . Cyanocobalamin (VITAMIN B 12 PO) Take by mouth daily.  . cycloSPORINE (RESTASIS) 0.05 % ophthalmic emulsion Place 1 drop into both eyes 2 (two) times daily.  Marland Kitchen escitalopram (LEXAPRO) 10 MG tablet TAKE 1 TABLET BY MOUTH EVERY DAY  .  nabumetone (RELAFEN) 500 MG tablet TAKE 1 TABLET TWICE A DAY AS NEEDED FOR MODERATE PAIN (AVOID WITH ALCOHOL)  . Omega-3 Fatty Acids (FISH OIL) 1200 MG CAPS Take 1,200 mg by mouth daily.  Marland Kitchen OVER THE COUNTER MEDICATION Turmeric daily  . triamcinolone (NASACORT ALLERGY 24HR) 55 MCG/ACT AERO nasal inhaler Place 2 sprays into the nose daily.  Marland Kitchen amphetamine-dextroamphetamine (ADDERALL) 10 MG tablet Take 1 tablet (10 mg total) by mouth 2 (two) times daily with a meal.  . Hyoscyamine  Sulfate SL (LEVSIN/SL) 0.125 MG SUBL Place 0.125 mg under the tongue every 4 (four) hours as needed. (Patient not taking: Reported on 10/16/2018)  . promethazine (PHENERGAN) 25 MG tablet Take 1 tablet (25 mg total) by mouth every 6 (six) hours as needed for nausea or vomiting (can cause fatigue). Max: 4 tablets per day (Patient not taking: Reported on 10/16/2018)   No current facility-administered medications on file prior to visit.    Health Maintenance:   Immunization History  Administered Date(s) Administered  . Influenza Inj Mdck Quad Pf 09/29/2016  . Tdap 04/16/2012   Tetanus: 2013 Pneumovax: N/A Prevnar 13: due age 61 Flu vaccine: get flu shot Zostavax: due at 52 Pap: 2017 neg HPV, repeat 5 years MGM: 02/2018 cat D suggest 3D, DUE DEXA: Colonoscopy:2011 Dr. Oletta Lamas due 10 years Ct head 05/2012 EGD:2009  Medical History:  Past Medical History:  Diagnosis Date  . ADD (attention deficit disorder)   . Blurred vision   . 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 Seasonal allergies    Allergies Allergies  Allergen Reactions  . Dye Fdc Red [Red Dye] Anaphylaxis  . Iodinated Diagnostic Agents Anaphylaxis  . Erythromycin Nausea And Vomiting    SURGICAL HISTORY She  has a past surgical history that includes Breast surgery; Cesarean section; Eye surgery; and Breast excisional biopsy (Bilateral).   FAMILY HISTORY Her family history includes Hypertension in her mother.   SOCIAL HISTORY She  reports that she has never smoked. She has never used smokeless tobacco. She reports current alcohol use. She reports that she does not use drugs.  Review of Systems  Constitutional: Negative.   HENT: Negative.   Eyes: Negative.   Respiratory: Negative.   Cardiovascular: Negative.   Gastrointestinal: Negative.   Genitourinary: Negative.   Musculoskeletal: Negative.   Skin: Negative.   Neurological: Negative.  Negative for dizziness, tingling, tremors, sensory change, speech change, focal  weakness, seizures and loss of consciousness.  Endo/Heme/Allergies: Negative.   Psychiatric/Behavioral: Positive for depression and memory loss. Negative for hallucinations, substance abuse and suicidal ideas. The patient is nervous/anxious and has insomnia.      Physical Exam: Estimated body mass index is 23.08 kg/m as calculated from the following:   Height as of this encounter: 5\' 2"  (1.575 m).   Weight as of this encounter: 126 lb 3.2 oz (57.2 kg). BP 120/68   Pulse 78   Temp (!) 97.3 F (36.3 C)   Ht 5\' 2"  (1.575 m)   Wt 126 lb 3.2 oz (57.2 kg)   LMP 05/11/2012   SpO2 99%   BMI 23.08 kg/m  General Appearance: Well nourished, in no apparent distress. Eyes: PERRLA, EOMs, conjunctiva no swelling or erythema, normal fundi and vessels. Sinuses: No Frontal/maxillary tenderness ENT/Mouth: Ext aud canals clear, normal light reflex with TMs without erythema, bulging.  Good dentition. No erythema, swelling, or exudate on post pharynx. Tonsils not swollen or erythematous. Hearing normal.  Neck: Supple, thyroid normal. No bruits Respiratory: Respiratory  effort normal, BS equal bilaterally without rales, rhonchi, wheezing or stridor. Cardio: RRR without murmurs, rubs or gallops. Brisk peripheral pulses without edema.  Chest: symmetric, with normal excursions and percussion. Breasts: Symmetric, without lumps, nipple discharge, retractions. Abdomen: Soft, +BS. Non tender, no guarding, rebound, hernias, masses, or organomegaly. .  Lymphatics: Non tender without lymphadenopathy.  Genitourinary: decline Musculoskeletal: Full ROM all peripheral extremities,5/5 strength, and normal gait. Skin: Warm, dry without rashes, lesions, ecchymosis.  Neuro: Cranial nerves intact, reflexes equal bilaterally. Normal muscle tone, no cerebellar symptoms. Sensation intact.  Psych: Awake and oriented X 3, normal affect, Insight and Judgment appropriate.   EKG:  WNL, PRWP, no ST changes   Vicie Mutters 9:31 AM

## 2018-10-16 ENCOUNTER — Ambulatory Visit (INDEPENDENT_AMBULATORY_CARE_PROVIDER_SITE_OTHER): Payer: Managed Care, Other (non HMO) | Admitting: Physician Assistant

## 2018-10-16 ENCOUNTER — Encounter: Payer: Self-pay | Admitting: Physician Assistant

## 2018-10-16 VITALS — BP 120/68 | HR 78 | Temp 97.3°F | Ht 62.0 in | Wt 126.2 lb

## 2018-10-16 DIAGNOSIS — K219 Gastro-esophageal reflux disease without esophagitis: Secondary | ICD-10-CM

## 2018-10-16 DIAGNOSIS — I10 Essential (primary) hypertension: Secondary | ICD-10-CM | POA: Diagnosis not present

## 2018-10-16 DIAGNOSIS — R922 Inconclusive mammogram: Secondary | ICD-10-CM

## 2018-10-16 DIAGNOSIS — Z1389 Encounter for screening for other disorder: Secondary | ICD-10-CM | POA: Diagnosis not present

## 2018-10-16 DIAGNOSIS — Z136 Encounter for screening for cardiovascular disorders: Secondary | ICD-10-CM | POA: Diagnosis not present

## 2018-10-16 DIAGNOSIS — F988 Other specified behavioral and emotional disorders with onset usually occurring in childhood and adolescence: Secondary | ICD-10-CM

## 2018-10-16 DIAGNOSIS — Z0001 Encounter for general adult medical examination with abnormal findings: Secondary | ICD-10-CM

## 2018-10-16 DIAGNOSIS — C4491 Basal cell carcinoma of skin, unspecified: Secondary | ICD-10-CM

## 2018-10-16 DIAGNOSIS — Z13220 Encounter for screening for lipoid disorders: Secondary | ICD-10-CM

## 2018-10-16 DIAGNOSIS — Z0000 Encounter for general adult medical examination without abnormal findings: Secondary | ICD-10-CM

## 2018-10-16 DIAGNOSIS — F411 Generalized anxiety disorder: Secondary | ICD-10-CM

## 2018-10-16 DIAGNOSIS — E559 Vitamin D deficiency, unspecified: Secondary | ICD-10-CM | POA: Diagnosis not present

## 2018-10-16 DIAGNOSIS — Z131 Encounter for screening for diabetes mellitus: Secondary | ICD-10-CM

## 2018-10-16 DIAGNOSIS — E785 Hyperlipidemia, unspecified: Secondary | ICD-10-CM

## 2018-10-16 DIAGNOSIS — Z1329 Encounter for screening for other suspected endocrine disorder: Secondary | ICD-10-CM

## 2018-10-16 DIAGNOSIS — Z79899 Other long term (current) drug therapy: Secondary | ICD-10-CM | POA: Diagnosis not present

## 2018-10-16 DIAGNOSIS — Z130 Encounter for screening for diseases of the blood and blood-forming organs and certain disorders involving the immune mechanism: Secondary | ICD-10-CM | POA: Diagnosis not present

## 2018-10-16 DIAGNOSIS — J309 Allergic rhinitis, unspecified: Secondary | ICD-10-CM

## 2018-10-16 MED ORDER — ALPRAZOLAM 0.5 MG PO TABS: ORAL_TABLET | ORAL | 0 refills | Status: DC

## 2018-10-16 MED ORDER — TRIAMCINOLONE ACETONIDE 55 MCG/ACT NA AERO: 2.0000 | INHALATION_SPRAY | Freq: Every day | NASAL | 3 refills | Status: DC

## 2018-10-16 MED ORDER — AMPHETAMINE-DEXTROAMPHETAMINE 10 MG PO TABS: 10.0000 mg | ORAL_TABLET | Freq: Two times a day (BID) | ORAL | 0 refills | Status: DC

## 2018-10-16 NOTE — Patient Instructions (Addendum)
Can do a steroid nasal spary 1-2 sparys at night each nostril.  Remember to spray each nostril twice towards the outer part of your eye.   Do not sniff but instead pinch your nose and tilt your head back to help the medicine get into your sinuses.   The best time to do this is at bedtime.  Stop if you get blurred vision or nose bleeds.   THIS WILL TAKE 7 DAYS TO WORK AND IS BETTER IF YOU START BEFORE SYMPTOMS SO IF YOU HAVE A SEASON OR TIME OF THE YEAR YOU ALWAYS GET A COLD, START BEFORE THAT!   GENERAL HEALTH GOALS  Know what a healthy weight is for you (roughly BMI <25) and aim to maintain this  Aim for 7+ servings of fruits and vegetables daily  70-80+ fluid ounces of water or unsweet tea for healthy kidneys  Limit to max 1 drink of alcohol per day; avoid smoking/tobacco  Limit animal fats in diet for cholesterol and heart health - choose grass fed whenever available  Avoid highly processed foods, and foods high in saturated/trans fats  Aim for low stress - take time to unwind and care for your mental health  Aim for 150 min of moderate intensity exercise weekly for heart health, and weights twice weekly for bone health  Aim for 7-9 hours of sleep daily   Coronavirus or Covid 19  . You will use the same precautions as you would with the flu virus.  . While we do not have a vaccine against Covid 19 and will not for several years, WE do have a vaccine against the flu. Please get this if you have not yet.  Marland Kitchen Please wash your hands frequently. Wendee Copp your hands before you eat or touch your face.  . Avoid touching your face, eyes, nose, mouth as much as possible.  . Routinely clean frequently touched items such as doorknobs, keyboards, and phones.  . Avoid crowds of people.   To get more information from reputable sources:  You can call this hotline set up by [facility] 1 877 40COVID ([phone])  You can visit these websites: CDC.gov WHO.int  If you feel that you have come  in contact with someone that may be infected with coronavirus.  Please stay at home.  Call our office.  Call the local health department.  [place] [phone])  After hours nurse triage line [phone])      When it comes to diets, agreement about the perfect plan isn't easy to find, even among the experts. Experts at the [employer] developed an idea known as the Healthy Eating Plate. Just imagine a plate divided into logical, healthy portions.  The emphasis is on diet quality:  Load up on vegetables and fruits - one-half of your plate: Aim for color and variety, and remember that potatoes don't count.  Go for whole grains - one-quarter of your plate: Whole wheat, barley, wheat berries, quinoa, oats, brown rice, and foods made with them. If you want pasta, go with whole wheat pasta.  Protein power - one-quarter of your plate: Fish, chicken, beans, and nuts are all healthy, versatile protein sources. Limit red meat.  The diet, however, does go beyond the plate, offering a few other suggestions.  Use healthy plant oils, such as olive, canola, soy, corn, sunflower and peanut. Check the labels, and avoid partially hydrogenated oil, which have unhealthy trans fats.  If you're thirsty, drink water. Coffee and  tea are good in moderation, but skip sugary drinks and limit milk and dairy products to one or two daily servings.  The type of carbohydrate in the diet is more important than the amount. Some sources of carbohydrates, such as vegetables, fruits, whole grains, and beans-are healthier than others.  Finally, stay active.

## 2018-10-17 LAB — VITAMIN B12: Vitamin B-12: 641 pg/mL (ref 200–1100)

## 2018-10-17 LAB — IRON, TOTAL/TOTAL IRON BINDING CAP
%SAT: 24 % (calc) (ref 16–45)
Iron: 88 ug/dL (ref 45–160)
TIBC: 363 mcg/dL (calc) (ref 250–450)

## 2018-10-17 LAB — CBC WITH DIFFERENTIAL/PLATELET
Absolute Monocytes: 365 cells/uL (ref 200–950)
Basophils Absolute: 31 cells/uL (ref 0–200)
Basophils Relative: 0.7 %
Eosinophils Absolute: 189 cells/uL (ref 15–500)
Eosinophils Relative: 4.3 %
HCT: 42.3 % (ref 35.0–45.0)
Hemoglobin: 14.1 g/dL (ref 11.7–15.5)
Lymphs Abs: 1980 cells/uL (ref 850–3900)
MCH: 30.5 pg (ref 27.0–33.0)
MCHC: 33.3 g/dL (ref 32.0–36.0)
MCV: 91.4 fL (ref 80.0–100.0)
MPV: 11 fL (ref 7.5–12.5)
Monocytes Relative: 8.3 %
Neutro Abs: 1835 cells/uL (ref 1500–7800)
Neutrophils Relative %: 41.7 %
Platelets: 280 10*3/uL (ref 140–400)
RBC: 4.63 10*6/uL (ref 3.80–5.10)
RDW: 12.9 % (ref 11.0–15.0)
Total Lymphocyte: 45 %
WBC: 4.4 10*3/uL (ref 3.8–10.8)

## 2018-10-17 LAB — COMPLETE METABOLIC PANEL WITH GFR
AG Ratio: 1.6 (calc) (ref 1.0–2.5)
ALT: 15 U/L (ref 6–29)
AST: 20 U/L (ref 10–35)
Albumin: 4.4 g/dL (ref 3.6–5.1)
Alkaline phosphatase (APISO): 101 U/L (ref 37–153)
BUN: 14 mg/dL (ref 7–25)
CO2: 28 mmol/L (ref 20–32)
Calcium: 9.4 mg/dL (ref 8.6–10.4)
Chloride: 103 mmol/L (ref 98–110)
Creat: 0.7 mg/dL (ref 0.50–1.05)
GFR, Est African American: 110 mL/min/{1.73_m2} (ref >=60)
GFR, Est Non African American: 95 mL/min/{1.73_m2} (ref >=60)
Globulin: 2.7 g/dL (calc) (ref 1.9–3.7)
Glucose, Bld: 78 mg/dL (ref 65–99)
Potassium: 4.4 mmol/L (ref 3.5–5.3)
Sodium: 139 mmol/L (ref 135–146)
TOTAL PROTEIN: 7.1 g/dL (ref 6.1–8.1)
Total Bilirubin: 0.4 mg/dL (ref 0.2–1.2)

## 2018-10-17 LAB — MAGNESIUM: Magnesium: 2 mg/dL (ref 1.5–2.5)

## 2018-10-17 LAB — LIPID PANEL
Cholesterol: 278 mg/dL — ABNORMAL HIGH (ref <200)
HDL: 63 mg/dL (ref >=50)
LDL Cholesterol (Calc): 193 mg/dL (calc) — ABNORMAL HIGH
Non-HDL Cholesterol (Calc): 215 mg/dL (calc) — ABNORMAL HIGH (ref <130)
Total CHOL/HDL Ratio: 4.4 (calc) (ref <5.0)
Triglycerides: 98 mg/dL (ref <150)

## 2018-10-17 LAB — URINALYSIS, ROUTINE W REFLEX MICROSCOPIC
BACTERIA UA: NONE SEEN /HPF
Bilirubin Urine: NEGATIVE
Glucose, UA: NEGATIVE
Hgb urine dipstick: NEGATIVE
Hyaline Cast: NONE SEEN /LPF
Ketones, ur: NEGATIVE
Nitrite: NEGATIVE
PROTEIN: NEGATIVE
RBC / HPF: NONE SEEN /HPF (ref 0–2)
Specific Gravity, Urine: 1.009 (ref 1.001–1.03)
Squamous Epithelial / HPF: NONE SEEN /HPF (ref <=5)
WBC, UA: NONE SEEN /HPF (ref 0–5)
pH: <=5 (ref 5.0–8.0)

## 2018-10-17 LAB — TSH: TSH: 1.47 mIU/L (ref 0.40–4.50)

## 2018-10-17 LAB — VITAMIN D 25 HYDROXY (VIT D DEFICIENCY, FRACTURES): Vit D, 25-Hydroxy: 36 ng/mL (ref 30–100)

## 2018-10-17 LAB — MICROALBUMIN / CREATININE URINE RATIO
Creatinine, Urine: 49 mg/dL (ref 20–275)
Microalb Creat Ratio: 4 mcg/mg creat (ref <30)
Microalb, Ur: 0.2 mg/dL

## 2018-11-09 ENCOUNTER — Encounter (INDEPENDENT_AMBULATORY_CARE_PROVIDER_SITE_OTHER): Payer: Managed Care, Other (non HMO) | Admitting: Ophthalmology

## 2018-11-09 ENCOUNTER — Other Ambulatory Visit: Payer: Self-pay

## 2018-11-09 DIAGNOSIS — H35371 Puckering of macula, right eye: Secondary | ICD-10-CM

## 2018-11-09 DIAGNOSIS — H43812 Vitreous degeneration, left eye: Secondary | ICD-10-CM

## 2018-11-09 DIAGNOSIS — H33303 Unspecified retinal break, bilateral: Secondary | ICD-10-CM

## 2018-11-09 DIAGNOSIS — H2513 Age-related nuclear cataract, bilateral: Secondary | ICD-10-CM | POA: Diagnosis not present

## 2018-11-12 ENCOUNTER — Other Ambulatory Visit: Payer: Self-pay | Admitting: Internal Medicine

## 2018-11-12 DIAGNOSIS — F411 Generalized anxiety disorder: Secondary | ICD-10-CM

## 2019-01-11 ENCOUNTER — Ambulatory Visit: Payer: Managed Care, Other (non HMO) | Admitting: Physician Assistant

## 2019-01-11 ENCOUNTER — Encounter: Payer: Self-pay | Admitting: Physician Assistant

## 2019-01-11 ENCOUNTER — Other Ambulatory Visit: Payer: Self-pay

## 2019-01-11 DIAGNOSIS — R922 Inconclusive mammogram: Secondary | ICD-10-CM

## 2019-01-11 DIAGNOSIS — N644 Mastodynia: Secondary | ICD-10-CM

## 2019-01-11 DIAGNOSIS — R079 Chest pain, unspecified: Secondary | ICD-10-CM | POA: Diagnosis not present

## 2019-01-11 MED ORDER — PANTOPRAZOLE SODIUM 20 MG PO TBEC: 20.0000 mg | DELAYED_RELEASE_TABLET | Freq: Every day | ORAL | 1 refills | Status: DC

## 2019-01-11 NOTE — Progress Notes (Signed)
THIS ENCOUNTER IS A VIRTUAL VISIT DUE TO COVID-19 - PATIENT WAS NOT SEEN IN THE OFFICE.  PATIENT HAS CONSENTED TO VIRTUAL VISIT / TELEMEDICINE VISIT  Virtual Visit via telephone Note  I connected with Amanda Salazar on 01/11/2019 by telephone.  I verified that I am speaking with the correct person using two identifiers.    I discussed the limitations of evaluation and management by telemedicine and the availability of in person appointments. The patient expressed understanding and agreed to proceed.  History of Present Illness: 58 y.o. WF with history of GERD, dense breast tissue and anxiety calls with complaints of pain in breast and chest x 1-2 weeks.  States it has been off and on, last time was a week ago. States left sided chest pain, dull nagging pain. No radiation. No symptoms associated with it.  Will also occ have left breast pain, for example when she turned over in bed this AM she felt a pulling on her left breast. States with palpations she has some discomfort.  She has history of GERD, not on a medication. Has history of HH, some gerd off and on.  States she has been anxious when she watches the news.  Screening MGM 02/2018  Family History  Problem Relation Age of Onset  . Hypertension Mother     Medications    Current Outpatient Medications (Respiratory):  .  cetirizine (ZYRTEC) 10 MG tablet, Take 1 tablet (10 mg total) by mouth daily. .  promethazine (PHENERGAN) 25 MG tablet, Take 1 tablet (25 mg total) by mouth every 6 (six) hours as needed for nausea or vomiting (can cause fatigue). Max: 4 tablets per day (Patient not taking: Reported on 10/16/2018) .  triamcinolone (NASACORT ALLERGY 24HR) 55 MCG/ACT AERO nasal inhaler, Place 2 sprays into the nose daily.  Current Outpatient Medications (Analgesics):  .  nabumetone (RELAFEN) 500 MG tablet, TAKE 1 TABLET TWICE A DAY AS NEEDED FOR MODERATE PAIN (AVOID WITH ALCOHOL)  Current Outpatient Medications (Hematological):  Marland Kitchen   Cyanocobalamin (VITAMIN B 12 PO), Take by mouth daily.  Current Outpatient Medications (Other):  Marland Kitchen  ALPRAZolam (XANAX) 0.5 MG tablet, 1/2-1 pill as needed daily for anxiety .  amphetamine-dextroamphetamine (ADDERALL) 10 MG tablet, Take 1 tablet (10 mg total) by mouth 2 (two) times daily with a meal. .  Cholecalciferol (VITAMIN D3 MAXIMUM STRENGTH) 5000 UNITS capsule, Take 5,000 Units by mouth daily. .  clotrimazole-betamethasone (LOTRISONE) cream, APPLY TO AFFECTED AREA TWICE A DAY .  cycloSPORINE (RESTASIS) 0.05 % ophthalmic emulsion, Place 1 drop into both eyes 2 (two) times daily. Marland Kitchen  escitalopram (LEXAPRO) 10 MG tablet, TAKE 1 TABLET BY MOUTH EVERY DAY .  Hyoscyamine Sulfate SL (LEVSIN/SL) 0.125 MG SUBL, Place 0.125 mg under the tongue every 4 (four) hours as needed. (Patient not taking: Reported on 10/16/2018) .  Omega-3 Fatty Acids (FISH OIL) 1200 MG CAPS, Take 1,200 mg by mouth daily. Marland Kitchen  OVER THE COUNTER MEDICATION, Turmeric daily  Problem list She has Hyperlipidemia; Allergic rhinitis; GERD; Dense breast tissue; Basal cell carcinoma; Generalized anxiety disorder; and ADD (attention deficit disorder) on their problem list.   Observations/Objective: General Appearance:Well sounding, in no apparent distress.  ENT/Mouth: No hoarseness, No cough for duration of visit.  Respiratory: completing full sentences without distress, without audible wheeze Neuro: Awake and oriented X 3,  Psych:  Insight and Judgment appropriate.   Assessment and Plan: Atypical chest pain ? Costochondritis, GERD, musculoskeletal, anxiety Non exertional, no accompaniments, lower risk Treat with PPI,  get diagnostic mammogram with dense breast, use salon pas Information sent Go to the ER if any chest pain, shortness of breath, nausea, dizziness, severe HA, changes vision/speech Follow up 1 month  Future Appointments  Date Time Provider [place]  01/11/2019 11:30 AM Vicie Mutters, PA-C [facility] None   03/11/2019  1:30 PM [name], MD [facility] None  04/18/2019  8:45 AM Vicie Mutters, PA-C [facility] None  10/22/2019  9:00 AM Vicie Mutters, PA-C [facility] None    Follow Up Instructions:  I discussed the assessment and treatment plan with the patient. The patient was provided an opportunity to ask questions and all were answered. The patient agreed with the plan and demonstrated an understanding of the instructions.   The patient was advised to call back or seek an in-person evaluation if the symptoms worsen or if the condition fails to improve as anticipated.  I provided 42 minutes of non-face-to-face time during this encounter with review of medical history, family history and social history.   Vicie Mutters, PA-C

## 2019-01-17 ENCOUNTER — Other Ambulatory Visit: Payer: Self-pay

## 2019-01-17 ENCOUNTER — Ambulatory Visit
Admission: RE | Admit: 2019-01-17 | Discharge: 2019-01-17 | Disposition: A | Discharge status: Home / Self Care | Payer: Managed Care, Other (non HMO) | Source: Ambulatory Visit | Attending: Physician Assistant | Admitting: Physician Assistant

## 2019-01-17 ENCOUNTER — Ambulatory Visit: Payer: Managed Care, Other (non HMO)

## 2019-01-17 DIAGNOSIS — R922 Inconclusive mammogram: Secondary | ICD-10-CM

## 2019-01-17 DIAGNOSIS — R9230 Dense breasts, unspecified: Secondary | ICD-10-CM

## 2019-01-17 DIAGNOSIS — N644 Mastodynia: Secondary | ICD-10-CM

## 2019-02-02 ENCOUNTER — Other Ambulatory Visit: Payer: Self-pay | Admitting: Physician Assistant

## 2019-02-02 DIAGNOSIS — R079 Chest pain, unspecified: Secondary | ICD-10-CM

## 2019-02-09 ENCOUNTER — Other Ambulatory Visit: Payer: Managed Care, Other (non HMO) | Admitting: Internal Medicine

## 2019-02-09 ENCOUNTER — Other Ambulatory Visit: Payer: Self-pay | Admitting: Internal Medicine

## 2019-02-09 DIAGNOSIS — N390 Urinary tract infection, site not specified: Secondary | ICD-10-CM | POA: Diagnosis not present

## 2019-02-09 DIAGNOSIS — M940 Chondrocostal junction syndrome [Tietze]: Secondary | ICD-10-CM | POA: Diagnosis not present

## 2019-02-09 MED ORDER — NITROFURANTOIN MONOHYD MACRO 100 MG PO CAPS: 100.0000 mg | ORAL_CAPSULE | Freq: Two times a day (BID) | ORAL | 0 refills | Status: AC

## 2019-02-09 NOTE — Progress Notes (Signed)
THIS ENCOUNTER IS A VIRTUAL VISIT DUE TO COVID-19 - PATIENT WAS NOT SEEN IN THE OFFICE.  PATIENT HAS CONSENTED TO VIRTUAL VISIT / TELEMEDICINE VISIT  Virtual Visit via telephone Note  I connected with  Amanda Salazar   on 02/09/2019 02/09/2019  by telephone.  I verified that I am speaking with the correct person using two identifiers.    I discussed the limitations of evaluation and management by telemedicine and the availability of in person appointments. The patient expressed understanding and agreed to proceed.  History of Present Illness:    This patient is a very nice 58 yo MWF with prior hx/o UTI who has had her usual bladder Sx's for 3-4 days and had started an old Rx of Cipro that she had on hand left over from an emergency travel pak. Despite 3 days of Cipro,  she has had no relief with her bladder aching & dysuria. Denies fever, chills , rash, abdominal pains otherwise.    Also discussed her sx's of left sided chest pain and with discussion & direction during the virtual exam , she was able to find a trigger point in her left chest under her rib reproducing her non-exertional chest pain.     Extensive discussion with patient & chart review re: "Red Dye" Allergy and patient strongly disavows any hx/o known reaction to any pills - red or otherwise, so Red Dye removed from her list of allergies.   Medications  .  cetirizine (ZYRTEC) 10 MG tablet, Take 1 tablet (10 mg total) by mouth daily. .  promethazine (PHENERGAN) 25 MG tablet, Take 1 tablet (25 mg total) by mouth every 6 (six) hours as needed for nausea or vomiting (can cause fatigue). Max: 4 tablets per day (Patient not taking: Reported on 10/16/2018) .  triamcinolone (NASACORT ALLERGY 24HR) 55 MCG/ACT AERO nasal inhaler, Place 2 sprays into the nose daily. .  nabumetone (RELAFEN) 500 MG tablet, TAKE 1 TABLET TWICE A DAY AS NEEDED FOR MODERATE PAIN (AVOID WITH ALCOHOL) .  Cyanocobalamin (VITAMIN B 12 PO), Take by mouth daily. Marland Kitchen   ALPRAZolam (XANAX) 0.5 MG tablet, 1/2-1 pill as needed daily for anxiety .  amphetamine-dextroamphetamine (ADDERALL) 10 MG tablet, Take 1 tablet (10 mg total) by mouth 2 (two) times daily with a meal. .  Cholecalciferol (VITAMIN D3 MAXIMUM STRENGTH) 5000 UNITS capsule, Take 5,000 Units by mouth daily. .  clotrimazole-betamethasone (LOTRISONE) cream, APPLY TO AFFECTED AREA TWICE A DAY .  cycloSPORINE (RESTASIS) 0.05 % ophthalmic emulsion, Place 1 drop into both eyes 2 (two) times daily. Marland Kitchen  escitalopram (LEXAPRO) 10 MG tablet, TAKE 1 TABLET BY MOUTH EVERY DAY .  Hyoscyamine Sulfate SL (LEVSIN/SL) 0.125 MG SUBL, Place 0.125 mg under the tongue every 4 (four) hours as needed. (Patient not taking: Reported on 10/16/2018) .  Omega-3 Fatty Acids (FISH OIL) 1200 MG CAPS, Take 1,200 mg by mouth daily. Marland Kitchen  OVER THE COUNTER MEDICATION, Turmeric daily .  pantoprazole (PROTONIX) 20 MG tablet, Take 1 tablet Daily for Indigestion &  Reflux  Problem list She has Hyperlipidemia; Allergic rhinitis; GERD; Dense breast tissue; Basal cell carcinoma; Generalized anxiety disorder; and ADD (attention deficit disorder) on their problem list.   Observations/Objective:  General : Well sounding patient in no apparent distress HEENT: no hoarseness, no cough for duration of visit Lungs: speaks in complete sentences, no audible wheezing, no apparent distress Neurological: alert, oriented x 3 Psychiatric: pleasant, judgement appropriate   Assessment and Plan:  1. Urinary tract infection  without hematuria, site unspecified  - nitrofurantoin, macrocrystal-monohydrate, (MACROBID) 100 MG capsule; Take 1 capsule (100 mg total) by mouth 2 (two) times daily for 7 days.  Dispense: 14 capsule; Refill: 0  2. Costochondritis  - encouraged her to use her Relafen (with GI precautions)  & heating pad   Follow Up Instructions: - Advised liberal fluid intake  ~ 100 oz /day and also is sx's not resolve to call back or seek an  in-person evaluation if the symptoms worsen or if the condition fails to improve as anticipated.   - I discussed the assessment and treatment plan with the patient. The patient was provided an opportunity to ask questions and all were answered. The patient agreed with the plan and demonstrated an understanding of the instructions.   I provided 30 minutes of non-face-to-face time during this encounter.   Kirtland Bouchard, MD

## 2019-02-13 NOTE — Progress Notes (Signed)
Assessment and Plan:   Hyperlipidemia, unspecified hyperlipidemia type -continue medications, check lipids, decrease fatty foods, increase activity.  -     Lipid panel  Labile hypertension -     CBC with Differential/Platelet -     BASIC METABOLIC PANEL WITH GFR -     Hepatic function panel  Medication management -     CBC with Differential/Platelet -     BASIC METABOLIC PANEL WITH GFR -     Hepatic function panel  Chest pain- likely costochondritis + tenderness to palpations on chest, non exertional, with no accompaniments-  Start on on the relafen and can do salon pas Go to the ER if any CP, SOB, nausea, dizziness, severe HA, changes vision/speech   Continue diet and meds as discussed. Further disposition pending results of labs. Over 30 minutes of exam, counseling, chart review, and critical decision making was performed Future Appointments  Date Time Provider [place]  02/14/2019  3:45 PM Vicie Mutters, PA-C [facility] None  03/11/2019  1:30 PM [name], MD [facility] None  04/18/2019  8:45 AM Vicie Mutters, PA-C [facility] None  10/22/2019  9:00 AM Vicie Mutters, PA-C [facility] None    HPI 58 y.o. female  presents for 3 month follow up on hypertension, cholesterol, stress/memory issues, and vitamin D deficiency.   Her blood pressure has been controlled at home, today their BP is    She does not workout. She denies chest pain, shortness of breath  She has had left sided chest pain x 6 weeks. States worse with turning over in bed. Intermittent pain, nagging, no radiation. No symptoms associated with it. Tried PPI x 06/05 and had normal MGM.   Patient is following with Dr. Tomi Likens for memory issues and vertigo found to have everything normal, other than still following with eye doctor. She has had negative RF, CCP, antiDNA, Sed rate, Hep C, HIV, RPR, B12, iron/ferritin, TSH all normal.  Had normal CT 2013. H Had had vasovagal episode in April 2018 with Iv dye  contrast.    She is not on cholesterol medication and denies myalgias. Her cholesterol is not at goal. The cholesterol last visit was:   Lab Results  Component Value Date   CHOL 278 (H) 10/16/2018   HDL 63 10/16/2018   LDLCALC 193 (H) 10/16/2018   LDLDIRECT 151.2 01/03/2008   TRIG 98 10/16/2018   CHOLHDL 4.4 10/16/2018   Last A1C in the office was:  Lab Results  Component Value Date   HGBA1C 4.8 06/28/2016   Patient is on Vitamin D supplement.   Lab Results  Component Value Date   VD25OH 36 10/16/2018     She has had yellow discharge, mild irritation, not having sex, no new partners.  Normal PAP 2017.   Current Medications:  Current Outpatient Medications on File Prior to Visit  Medication Sig Dispense Refill  . ALPRAZolam (XANAX) 0.5 MG tablet 1/2-1 pill as needed daily for anxiety 60 tablet 0  . amphetamine-dextroamphetamine (ADDERALL) 10 MG tablet Take 1 tablet (10 mg total) by mouth 2 (two) times daily with a meal. 60 tablet 0  . cetirizine (ZYRTEC) 10 MG tablet Take 1 tablet (10 mg total) by mouth daily. 90 tablet 1  . Cholecalciferol (VITAMIN D3 MAXIMUM STRENGTH) 5000 UNITS capsule Take 5,000 Units by mouth daily.    . clotrimazole-betamethasone (LOTRISONE) cream APPLY TO AFFECTED AREA TWICE A DAY 15 g 2  . Cyanocobalamin (VITAMIN B 12 PO) Take by mouth daily.    Marland Kitchen  cycloSPORINE (RESTASIS) 0.05 % ophthalmic emulsion Place 1 drop into both eyes 2 (two) times daily.    Marland Kitchen escitalopram (LEXAPRO) 10 MG tablet TAKE 1 TABLET BY MOUTH EVERY DAY 90 tablet 1  . Hyoscyamine Sulfate SL (LEVSIN/SL) 0.125 MG SUBL Place 0.125 mg under the tongue every 4 (four) hours as needed. (Patient not taking: Reported on 10/16/2018) 30 each 0  . nabumetone (RELAFEN) 500 MG tablet TAKE 1 TABLET TWICE A DAY AS NEEDED FOR MODERATE PAIN (AVOID WITH ALCOHOL) 180 tablet 1  . nitrofurantoin, macrocrystal-monohydrate, (MACROBID) 100 MG capsule Take 1 capsule (100 mg total) by mouth 2 (two) times daily for 7  days. 14 capsule 0  . Omega-3 Fatty Acids (FISH OIL) 1200 MG CAPS Take 1,200 mg by mouth daily.    Marland Kitchen OVER THE COUNTER MEDICATION Turmeric daily    . pantoprazole (PROTONIX) 20 MG tablet Take 1 tablet Daily for Indigestion &  Reflux 90 tablet 0  . promethazine (PHENERGAN) 25 MG tablet Take 1 tablet (25 mg total) by mouth every 6 (six) hours as needed for nausea or vomiting (can cause fatigue). Max: 4 tablets per day (Patient not taking: Reported on 10/16/2018) 30 tablet 0  . triamcinolone (NASACORT ALLERGY 24HR) 55 MCG/ACT AERO nasal inhaler Place 2 sprays into the nose daily. 1 Inhaler 3   No current facility-administered medications on file prior to visit.    Medical History:  Past Medical History:  Diagnosis Date  . ADD (attention deficit disorder)   . 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 BCC (basal cell carcinoma of skin) 04/30/2008   Right Neck  . Blurred vision   . 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 Seasonal allergies    Allergies:  Allergies  Allergen Reactions  . Iodinated Diagnostic Agents Anaphylaxis  . Erythromycin Nausea And Vomiting     Review of Systems:  Review of Systems  Constitutional: Negative.   HENT: Negative.   Eyes: Negative for blurred vision.  Respiratory: Negative.   Cardiovascular: Positive for chest pain. Negative for palpitations, orthopnea, claudication, leg swelling and PND.  Gastrointestinal: Negative.   Genitourinary: Negative.   Musculoskeletal: Negative.   Skin: Negative.   Neurological: Negative for tingling, tremors, sensory change, speech change, focal weakness, seizures and loss of consciousness.  Endo/Heme/Allergies: Negative.   Psychiatric/Behavioral: Negative for depression, hallucinations, memory loss, substance abuse and suicidal ideas. The patient is nervous/anxious. The patient does not have insomnia.     Family history- Review and unchanged Social history- Review and unchanged Physical Exam: LMP 05/11/2012   Wt Readings from Last 3 Encounters:  10/16/18 126 lb 3.2 oz (57.2 kg)  08/21/17 125 lb (56.7 kg)  07/28/17 121 lb 12.8 oz (55.2 kg)   General Appearance: Well nourished, in no apparent distress. Eyes: PERRLA, EOMs, conjunctiva no swelling or erythema Sinuses: No Frontal/maxillary tenderness ENT/Mouth: Ext aud canals clear, TMs without erythema, bulging. No erythema, swelling, or exudate on post pharynx.  Tonsils not swollen or erythematous. Hearing normal.  Neck: Supple, thyroid normal.  Respiratory: Respiratory effort normal, BS equal bilaterally without rales, rhonchi, wheezing or stridor.  Cardio: Chest + tenderness on the left, RRR with no MRGs. Brisk peripheral pulses without edema.  Abdomen: Soft, + BS,  Non tender, no guarding, rebound, hernias, masses. Lymphatics: Non tender without lymphadenopathy.  Musculoskeletal: Full ROM, 5/5 strength, Normal gait Skin: Warm, dry without rashes, lesions, ecchymosis.  Neuro: Cranial nerves intact. Normal muscle tone, no cerebellar symptoms. Psych: Awake  and oriented X 3, normal affect, Insight and Judgment appropriate.     Vicie Mutters, PA-C 1:27 PM [facility]

## 2019-02-14 ENCOUNTER — Other Ambulatory Visit: Payer: Self-pay

## 2019-02-14 ENCOUNTER — Encounter: Payer: Self-pay | Admitting: Physician Assistant

## 2019-02-14 ENCOUNTER — Ambulatory Visit (INDEPENDENT_AMBULATORY_CARE_PROVIDER_SITE_OTHER): Payer: Managed Care, Other (non HMO) | Admitting: Physician Assistant

## 2019-02-14 VITALS — BP 118/74 | HR 61 | Temp 97.2°F | Ht 62.0 in | Wt 127.6 lb

## 2019-02-14 DIAGNOSIS — Z79899 Other long term (current) drug therapy: Secondary | ICD-10-CM

## 2019-02-14 DIAGNOSIS — E785 Hyperlipidemia, unspecified: Secondary | ICD-10-CM | POA: Diagnosis not present

## 2019-02-14 DIAGNOSIS — E559 Vitamin D deficiency, unspecified: Secondary | ICD-10-CM | POA: Diagnosis not present

## 2019-02-14 DIAGNOSIS — Z130 Encounter for screening for diseases of the blood and blood-forming organs and certain disorders involving the immune mechanism: Secondary | ICD-10-CM

## 2019-02-14 DIAGNOSIS — R079 Chest pain, unspecified: Secondary | ICD-10-CM | POA: Diagnosis not present

## 2019-02-14 DIAGNOSIS — F411 Generalized anxiety disorder: Secondary | ICD-10-CM

## 2019-02-14 NOTE — Patient Instructions (Addendum)
Relafen is an antiinflammatory It helps pain, can not take with aleve, or ibuprofen You can take tylenol (500mg ) or tylenol arthritis (650mg ) with the meloxicam/antiinflammatories. The max you can take of tylenol a day is 3000mg  daily, this is a max of 6 pills a day of the regular tyelnol (500mg ) or a max of 4 a day of the tylenol arthritis (650mg ) as long as no other medications you are taking contain tylenol.   Relafen can cause inflammation in your stomach and can cause ulcers or bleeding, this will look like black tarry stools Make sure you take your relafen with food Try not to take it daily, take AS needed  INFORMATION ABOUT YOUR XRAY  Can walk into [address] building for an Insurance account manager. They will have the order and take you back. You do not any paper work, I should get the result back today or tomorrow. This order is good for a year.  Can call [phone] to schedule an appointment if you wish.     Costochondritis  Costochondritis is swelling and irritation (inflammation) of the tissue (cartilage) that connects your ribs to your breastbone (sternum). This causes pain in the front of your chest. The pain usually starts gradually and involves more than one rib. What are the causes? The exact cause of this condition is not always known. It results from stress on the cartilage where your ribs attach to your sternum. The cause of this stress could be:  Chest injury (trauma).  Exercise or activity, such as lifting.  Severe coughing. What increases the risk? You may be at higher risk for this condition if you:  Are female.  Are 58?59 years old.  Recently started a new exercise or work activity.  Have low levels of vitamin D.  Have a condition that makes you cough frequently. What are the signs or symptoms? The main symptom of this condition is chest pain. The pain:  Usually starts gradually and can be sharp or dull.  Gets worse with deep breathing, coughing, or exercise.   Gets better with rest.  May be worse when you press on the sternum-rib connection (tenderness). How is this diagnosed? This condition is diagnosed based on your symptoms, medical history, and a physical exam. Your health care provider will check for tenderness when pressing on your sternum. This is the most important finding. You may also have tests to rule out other causes of chest pain. These may include:  A chest X-ray to check for lung problems.  An electrocardiogram (ECG) to see if you have a heart problem that could be causing the pain.  An imaging scan to rule out a chest or rib fracture. How is this treated? This condition usually goes away on its own over time. Your health care provider may prescribe an NSAID to reduce pain and inflammation. Your health care provider may also suggest that you:  Rest and avoid activities that make pain worse.  Apply heat or cold to the area to reduce pain and inflammation.  Do exercises to stretch your chest muscles. If these treatments do not help, your health care provider may inject a numbing medicine at the sternum-rib connection to help relieve the pain. Follow these instructions at home:  Avoid activities that make pain worse. This includes any activities that use chest, abdominal, and side muscles.  If directed, put ice on the painful area: ? Put ice in a plastic bag. ? Place a towel between your skin and the bag. ? Leave  the ice on for 20 minutes, 2-3 times a day.  If directed, apply heat to the affected area as often as told by your health care provider. Use the heat source that your health care provider recommends, such as a moist heat pack or a heating pad. ? Place a towel between your skin and the heat source. ? Leave the heat on for 20-30 minutes. ? Remove the heat if your skin turns bright red. This is especially important if you are unable to feel pain, heat, or cold. You may have a greater risk of getting burned.  Take  over-the-counter and prescription medicines only as told by your health care provider.  Return to your normal activities as told by your health care provider. Ask your health care provider what activities are safe for you.  Keep all follow-up visits as told by your health care provider. This is important. Contact a health care provider if:  You have chills or a fever.  Your pain does not go away or it gets worse.  You have a cough that does not go away (is persistent). Get help right away if:  You have shortness of breath. This information is not intended to replace advice given to you by your health care provider. Make sure you discuss any questions you have with your health care provider. Document Released: 05/04/2005 Document Revised: 08/09/2017 Document Reviewed: 11/18/2015 Elsevier Patient Education  2020 [employer].

## 2019-02-15 LAB — COMPLETE METABOLIC PANEL WITH GFR
AG Ratio: 1.7 (calc) (ref 1.0–2.5)
ALT: 12 U/L (ref 6–29)
AST: 20 U/L (ref 10–35)
Albumin: 4.4 g/dL (ref 3.6–5.1)
Alkaline phosphatase (APISO): 96 U/L (ref 37–153)
BUN: 16 mg/dL (ref 7–25)
CO2: 27 mmol/L (ref 20–32)
Calcium: 9.6 mg/dL (ref 8.6–10.4)
Chloride: 103 mmol/L (ref 98–110)
Creat: 0.74 mg/dL (ref 0.50–1.05)
GFR, Est African American: 103 mL/min/{1.73_m2} (ref >=60)
GFR, Est Non African American: 89 mL/min/{1.73_m2} (ref >=60)
Globulin: 2.6 g/dL (calc) (ref 1.9–3.7)
Glucose, Bld: 95 mg/dL (ref 65–99)
Potassium: 4.3 mmol/L (ref 3.5–5.3)
Sodium: 139 mmol/L (ref 135–146)
Total Bilirubin: 0.4 mg/dL (ref 0.2–1.2)
Total Protein: 7 g/dL (ref 6.1–8.1)

## 2019-02-15 LAB — LIPID PANEL
Cholesterol: 244 mg/dL — ABNORMAL HIGH (ref <200)
HDL: 58 mg/dL (ref >=50)
LDL Cholesterol (Calc): 167 mg/dL (calc) — ABNORMAL HIGH
Non-HDL Cholesterol (Calc): 186 mg/dL (calc) — ABNORMAL HIGH (ref <130)
Total CHOL/HDL Ratio: 4.2 (calc) (ref <5.0)
Triglycerides: 82 mg/dL (ref <150)

## 2019-02-15 LAB — CBC WITH DIFFERENTIAL/PLATELET
Absolute Monocytes: 311 cells/uL (ref 200–950)
Basophils Absolute: 18 cells/uL (ref 0–200)
Basophils Relative: 0.3 %
Eosinophils Absolute: 104 cells/uL (ref 15–500)
Eosinophils Relative: 1.7 %
HCT: 41.3 % (ref 35.0–45.0)
Hemoglobin: 14.2 g/dL (ref 11.7–15.5)
Lymphs Abs: 2019 cells/uL (ref 850–3900)
MCH: 31.3 pg (ref 27.0–33.0)
MCHC: 34.4 g/dL (ref 32.0–36.0)
MCV: 91.2 fL (ref 80.0–100.0)
MPV: 11 fL (ref 7.5–12.5)
Monocytes Relative: 5.1 %
Neutro Abs: 3648 cells/uL (ref 1500–7800)
Neutrophils Relative %: 59.8 %
Platelets: 269 10*3/uL (ref 140–400)
RBC: 4.53 10*6/uL (ref 3.80–5.10)
RDW: 12.4 % (ref 11.0–15.0)
Total Lymphocyte: 33.1 %
WBC: 6.1 10*3/uL (ref 3.8–10.8)

## 2019-02-15 LAB — IRON, TOTAL/TOTAL IRON BINDING CAP
%SAT: 21 % (calc) (ref 16–45)
Iron: 71 ug/dL (ref 45–160)
TIBC: 346 mcg/dL (calc) (ref 250–450)

## 2019-02-15 LAB — VITAMIN B12: Vitamin B-12: 1516 pg/mL — ABNORMAL HIGH (ref 200–1100)

## 2019-02-15 LAB — TSH: TSH: 1.45 mIU/L (ref 0.40–4.50)

## 2019-02-15 LAB — MAGNESIUM: Magnesium: 2.2 mg/dL (ref 1.5–2.5)

## 2019-03-11 ENCOUNTER — Encounter (INDEPENDENT_AMBULATORY_CARE_PROVIDER_SITE_OTHER): Payer: Managed Care, Other (non HMO) | Admitting: Ophthalmology

## 2019-03-27 ENCOUNTER — Encounter (INDEPENDENT_AMBULATORY_CARE_PROVIDER_SITE_OTHER): Payer: Managed Care, Other (non HMO) | Admitting: Ophthalmology

## 2019-04-02 ENCOUNTER — Other Ambulatory Visit: Payer: Self-pay | Admitting: Physician Assistant

## 2019-04-02 DIAGNOSIS — J309 Allergic rhinitis, unspecified: Secondary | ICD-10-CM

## 2019-04-18 ENCOUNTER — Ambulatory Visit: Payer: Self-pay | Admitting: Physician Assistant

## 2019-04-22 ENCOUNTER — Other Ambulatory Visit: Payer: Self-pay

## 2019-04-22 ENCOUNTER — Ambulatory Visit (INDEPENDENT_AMBULATORY_CARE_PROVIDER_SITE_OTHER): Payer: Managed Care, Other (non HMO) | Admitting: Ophthalmology

## 2019-04-22 DIAGNOSIS — H3581 Retinal edema: Secondary | ICD-10-CM

## 2019-04-22 DIAGNOSIS — H35371 Puckering of macula, right eye: Secondary | ICD-10-CM

## 2019-04-22 DIAGNOSIS — H25813 Combined forms of age-related cataract, bilateral: Secondary | ICD-10-CM

## 2019-04-22 DIAGNOSIS — H43813 Vitreous degeneration, bilateral: Secondary | ICD-10-CM | POA: Diagnosis not present

## 2019-04-22 DIAGNOSIS — Z9889 Other specified postprocedural states: Secondary | ICD-10-CM | POA: Diagnosis not present

## 2019-04-22 DIAGNOSIS — H35341 Macular cyst, hole, or pseudohole, right eye: Secondary | ICD-10-CM

## 2019-04-22 DIAGNOSIS — H33312 Horseshoe tear of retina without detachment, left eye: Secondary | ICD-10-CM

## 2019-04-22 NOTE — Progress Notes (Signed)
[facility] Note  04/22/2019     CHIEF COMPLAINT Patient presents for Retina Follow Up   HISTORY OF PRESENT ILLNESS: Amanda Salazar is a 58 y.o. female who presents to the clinic today for:   HPI    Retina Follow Up    Patient presents with  PVD.  In left eye.  This started weeks ago.  Severity is moderate.  Duration of weeks.  Since onset it is stable.  I, the attending physician,  performed the HPI with the patient and updated documentation appropriately.          Comments    9 mo follow up Patient states her vision is still very poor in the distance.  Patient states she got new glasses for distance--unsure if they help or not (did not bring today) and complains of an increase in floaters.  Patient denies flashes of light.  Patient denies eye pain or discomfort.       Last edited by [name], MD on 04/23/2019 12:34 AM. (History)     pt states her distance vision is frustrating for her, she states she can see letters on the eye chart, but states there are "things" around the letters and they come in and out of focus, she denies seeing FOL, but feels like her floater are worse, pt is using restasis BID OU   Referring physician: [name], MD [place],  [place] [address]  HISTORICAL INFORMATION:   Selected notes from the [facility] Referred by Dr. Zigmund Daniel for possible RD LEE- 04.27.18 (JDM) [BCVA OD: 20/20-1 OS: 20/20-1] Ocular Hx-  PMH-     CURRENT MEDICATIONS: Current Outpatient Medications (Ophthalmic Drugs)  Medication Sig  . cycloSPORINE (RESTASIS) 0.05 % ophthalmic emulsion Place 1 drop into both eyes 2 (two) times daily.   No current facility-administered medications for this visit.  (Ophthalmic Drugs)   Current Outpatient Medications (Other)  Medication Sig  . ALPRAZolam (XANAX) 0.5 MG tablet 1/2-1 pill as needed daily for anxiety  . amphetamine-dextroamphetamine (ADDERALL) 10 MG  tablet Take 1 tablet (10 mg total) by mouth 2 (two) times daily with a meal.  . cetirizine (ZYRTEC) 10 MG tablet Take 1 tablet (10 mg total) by mouth daily.  . Cholecalciferol (VITAMIN D3 MAXIMUM STRENGTH) 5000 UNITS capsule Take 5,000 Units by mouth daily.  . clotrimazole-betamethasone (LOTRISONE) cream APPLY TO AFFECTED AREA TWICE A DAY  . Cyanocobalamin (VITAMIN B 12 PO) Take by mouth daily.  Marland Kitchen escitalopram (LEXAPRO) 10 MG tablet TAKE 1 TABLET BY MOUTH EVERY DAY  . Hyoscyamine Sulfate SL (LEVSIN/SL) 0.125 MG SUBL Place 0.125 mg under the tongue every 4 (four) hours as needed.  . nabumetone (RELAFEN) 500 MG tablet TAKE 1 TABLET TWICE A DAY AS NEEDED FOR MODERATE PAIN (AVOID WITH ALCOHOL)  . Omega-3 Fatty Acids (FISH OIL) 1200 MG CAPS Take 1,200 mg by mouth daily.  Marland Kitchen OVER THE COUNTER MEDICATION Turmeric daily  . pantoprazole (PROTONIX) 20 MG tablet Take 1 tablet Daily for Indigestion &  Reflux  . promethazine (PHENERGAN) 25 MG tablet Take 1 tablet (25 mg total) by mouth every 6 (six) hours as needed for nausea or vomiting (can cause fatigue). Max: 4 tablets per day  . triamcinolone (NASACORT) 55 MCG/ACT AERO nasal inhaler SPRAY 2 SPRAYS NASALLY DAILY   No current facility-administered medications for this visit.  (Other)      REVIEW OF SYSTEMS: ROS    Positive for: Eyes  Negative for: Constitutional, Gastrointestinal, Neurological, Skin, Genitourinary, Musculoskeletal, HENT, Endocrine, Cardiovascular, Respiratory, Psychiatric, Allergic/Imm, Heme/Lymph   Last edited by Doneen Poisson on 04/22/2019  3:20 PM. (History)       ALLERGIES Allergies  Allergen Reactions  . Iodinated Diagnostic Agents Anaphylaxis  . Erythromycin Nausea And Vomiting    PAST MEDICAL HISTORY Past Medical History:  Diagnosis Date  . ADD (attention deficit disorder)   . 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 BCC (basal cell carcinoma of skin) 04/30/2008   Right Neck  . Blurred  vision   . 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 Seasonal allergies    Past Surgical History:  Procedure Laterality Date  . BREAST EXCISIONAL BIOPSY Bilateral   . BREAST SURGERY     biopsy x 3  . CESAREAN SECTION     x 4  . EYE SURGERY      FAMILY HISTORY Family History  Problem Relation Age of Onset  . Hypertension Mother     SOCIAL HISTORY Social History   Tobacco Use  . Smoking status: Never Smoker  . Smokeless tobacco: Never Used  Substance Use Topics  . Alcohol use: Yes    Comment: glass wine most nights  . Drug use: No         OPHTHALMIC EXAM:  Base Eye Exam    Visual Acuity (Snellen - Linear)      Right Left   Dist [place] 20/50 -1 20/25   Dist ph [place] 20/25 -1 20/20 -1       Tonometry (Tonopen, 3:24 PM)      Right Left   Pressure 16 20       Pupils      Dark Light Shape React APD   Right 3 2 Round Brisk 0   Left 3 2 Round Brisk 0       Visual Fields      Left Right    Full Full       Extraocular Movement      Right Left    Full Full       Neuro/Psych    Oriented x3: Yes   Mood/Affect: Normal       Dilation    Both eyes: 1.0% Mydriacyl, 2.5% Phenylephrine @ 3:24 PM        Slit Lamp and Fundus Exam    Slit Lamp Exam      Right Left   Lids/Lashes Mild Meibomian gland dysfunction, mild Telangiectasia Mild Meibomian gland dysfunction, mild Telangiectasia   Conjunctiva/Sclera White and quiet White and quiet   Cornea Arcus, 4 RK scars at 1200, 0300, 0600, and 0900, trace Punctate epithelial erosions Arcus, Inferior trace Punctate epithelial erosions, 4 RK scars at 1100, 0200, 0500, 0800   Anterior Chamber Deep and quiet Deep and quiet   Iris Round and dilated Round and dilated   Lens 2+ Nuclear sclerosis, 2+ Cortical cataract 2+ Nuclear sclerosis, 2+ Cortical cataract   Vitreous Mild Vitreous syneresis, Posterior vitreous detachment, Weiss ring Vitreous syneresis, Posterior vitreous detachment, Weiss ring, vitreous condensations       Fundus Exam       Right Left   Disc Pink and Sharp Pink and Sharp   C/D Ratio 0.3 0.3   Macula Flat, good foveal reflex, Nasal Epiretinal membrane with pucker and stria nasally - stable from prior, No heme  Flat, Good foveal reflex, mild Retinal pigment epithelial mottling, No heme or edema   Vessels  Mild Copper wiring,mild AV crossing changes Mild AV crossing changes, mild Vascular attenuation   Periphery Attached, small hole with good laser surrounding at 1100 Attached, VR tuft at 1030, small horseshoe tear at 0900 - good laser changes surrounding both lesions        Refraction    Wearing Rx   Patient bought distance glasses but did not bring today.          IMAGING AND PROCEDURES  Imaging and Procedures for 11/17/17  OCT, Retina - OU - Both Eyes       Right Eye Quality was good. Central Foveal Thickness: 381. Progression has been stable. Findings include macular pucker, epiretinal membrane, abnormal foveal contour, no IRF, no SRF (Nasal ERM - stable thickening nasal macula, macular pucker).   Left Eye Quality was good. Central Foveal Thickness: 248. Progression has been stable. Findings include normal foveal contour, no IRF, no SRF.   Notes *Images captured and stored on drive  Diagnosis / Impression:  OD: nasal ERM w/ pucker - stable thickening of retina OS: NFP, No IRF/SRF  Clinical management:  See below  Abbreviations: NFP - Normal foveal profile. CME - cystoid macular edema. PED - pigment epithelial detachment. IRF - intraretinal fluid. SRF - subretinal fluid. EZ - ellipsoid zone. ERM - epiretinal membrane. ORA - outer retinal atrophy. ORT - outer retinal tubulation. SRHM - subretinal hyper-reflective material                  ASSESSMENT/PLAN:    ICD-10-CM   1. Retinal tear of left eye  H33.312   2. Posterior vitreous detachment of both eyes  H43.813   3. History of repair of retinal tear by laser photocoagulation  Z98.890   4. Epiretinal membrane (ERM) of right  eye  H35.371   5. Lamellar macular hole of right eye  H35.341   6. Retinal edema  H35.81 OCT, Retina - OU - Both Eyes  7. Combined forms of age-related cataract of both eyes  H25.813     1,2. Acute PVD with retinal tear, OS    - small horseshoe tear located at 0900 -- no SRF  - S/P laser retinopexy OS (03.11.19) - good laser changes  - no new tears or detachment on repeat 360 peripheral exam  - f/u 9 months  3. History of retinal tear OD  - 1100 oclock position  - s/p laser retinopexy with Dr. Zigmund Daniel  - stable  - continue to monitor  4,5. Epiretinal membrane, OD  - The natural history, anatomy, potential for loss of vision, and treatment options including vitrectomy techniques and the complications of endophthalmitis, retinal detachment, vitreous hemorrhage, cataract progression and permanent vision loss discussed with the patient.  - BCVA remains improved to 20/25-1 from 20/40 +2  - stable thickening of ERM on OCT  - no indication for surgery at this time  - continue to monitor  6. No retinal edema on exam or OCT  7. Combined form age-related cataract OU-   - The symptoms of cataract, surgical options, and treatments and risks were discussed with patient.  - discussed diagnosis and progression  - not yet visually significant  - continue to monitor for now   Ophthalmic Meds Ordered this visit:  No orders of the defined types were placed in this encounter.      Return in about 9 months (around 01/20/2020) for f/u ERM OD, DFE, OCT.  There are no Patient Instructions on file for this visit.  Explained the diagnoses, plan, and follow up with the patient and they expressed understanding.  Patient expressed understanding of the importance of proper follow up care.   This document serves as a record of services personally performed by Gardiner Sleeper, MD, PhD. It was created on their behalf by Ernest Mallick, OA, an ophthalmic assistant. The creation of this record is the  provider's dictation and/or activities during the visit.    Electronically signed by: Ernest Mallick, OA  09.14.2020 12:35 AM    Gardiner Sleeper, M.D., Ph.D. Diseases & Surgery of the Retina and Vitreous [facility]   I have reviewed the above documentation for accuracy and completeness, and I agree with the above. Gardiner Sleeper, M.D., Ph.D. 04/23/19 12:36 AM    Abbreviations: M myopia (nearsighted); A astigmatism; H hyperopia (farsighted); P presbyopia; Mrx spectacle prescription;  CTL contact lenses; OD right eye; OS left eye; OU both eyes  XT exotropia; ET esotropia; PEK punctate epithelial keratitis; PEE punctate epithelial erosions; DES dry eye syndrome; MGD meibomian gland dysfunction; ATs artificial tears; PFAT's preservative free artificial tears; [place] nuclear sclerotic cataract; PSC posterior subcapsular cataract; ERM epi-retinal membrane; PVD posterior vitreous detachment; RD retinal detachment; DM diabetes mellitus; DR diabetic retinopathy; NPDR non-proliferative diabetic retinopathy; PDR proliferative diabetic retinopathy; CSME clinically significant macular edema; DME diabetic macular edema; dbh dot blot hemorrhages; CWS cotton wool spot; POAG primary open angle glaucoma; C/D cup-to-disc ratio; HVF humphrey visual field; GVF goldmann visual field; OCT optical coherence tomography; IOP intraocular pressure; BRVO Branch retinal vein occlusion; CRVO central retinal vein occlusion; CRAO central retinal artery occlusion; BRAO branch retinal artery occlusion; RT retinal tear; SB scleral buckle; PPV pars plana vitrectomy; VH Vitreous hemorrhage; PRP panretinal laser photocoagulation; IVK intravitreal kenalog; VMT vitreomacular traction; MH Macular hole;  NVD neovascularization of the disc; NVE neovascularization elsewhere; AREDS age related eye disease study; ARMD age related macular degeneration; POAG primary open angle glaucoma; EBMD epithelial/anterior basement membrane  dystrophy; ACIOL anterior chamber intraocular lens; IOL intraocular lens; PCIOL posterior chamber intraocular lens; Phaco/IOL phacoemulsification with intraocular lens placement; [place] photorefractive keratectomy; LASIK laser assisted in situ keratomileusis; HTN hypertension; DM diabetes mellitus; COPD chronic obstructive pulmonary disease

## 2019-04-23 ENCOUNTER — Encounter (INDEPENDENT_AMBULATORY_CARE_PROVIDER_SITE_OTHER): Payer: Self-pay | Admitting: Ophthalmology

## 2019-04-28 ENCOUNTER — Other Ambulatory Visit: Payer: Self-pay | Admitting: Internal Medicine

## 2019-04-28 DIAGNOSIS — R079 Chest pain, unspecified: Secondary | ICD-10-CM

## 2019-05-15 ENCOUNTER — Other Ambulatory Visit: Payer: Self-pay | Admitting: Physician Assistant

## 2019-05-16 ENCOUNTER — Ambulatory Visit
Admission: RE | Admit: 2019-05-16 | Discharge: 2019-05-16 | Disposition: A | Discharge status: Home / Self Care | Payer: Managed Care, Other (non HMO) | Source: Ambulatory Visit | Attending: Physician Assistant | Admitting: Physician Assistant

## 2019-05-16 ENCOUNTER — Other Ambulatory Visit: Payer: Self-pay

## 2019-05-16 DIAGNOSIS — R079 Chest pain, unspecified: Secondary | ICD-10-CM

## 2019-07-03 ENCOUNTER — Other Ambulatory Visit: Payer: Self-pay | Admitting: Internal Medicine

## 2019-07-03 DIAGNOSIS — F411 Generalized anxiety disorder: Secondary | ICD-10-CM

## 2019-07-03 MED ORDER — ESCITALOPRAM OXALATE 10 MG PO TABS: ORAL_TABLET | ORAL | 1 refills | Status: DC

## 2019-07-07 ENCOUNTER — Other Ambulatory Visit: Payer: Self-pay | Admitting: Internal Medicine

## 2019-07-07 DIAGNOSIS — F411 Generalized anxiety disorder: Secondary | ICD-10-CM

## 2019-07-07 MED ORDER — ESCITALOPRAM OXALATE 10 MG PO TABS: ORAL_TABLET | ORAL | 1 refills | Status: DC

## 2019-07-15 NOTE — Progress Notes (Deleted)
Assessment and Plan:    Continue diet and meds as discussed. Further disposition pending results of labs. Over 30 minutes of exam, counseling, chart review, and critical decision making was performed Future Appointments  Date Time Provider [place]  07/16/2019  4:00 PM Vicie Mutters, PA-C [id number] None  10/22/2019  9:00 AM Vicie Mutters, PA-C [id number] None  01/20/2020  3:00 PM [name], MD [facility] None    HPI 58 y.o. female  presents for 3 month follow up on hypertension, cholesterol, stress/memory issues, and vitamin D deficiency.   Her blood pressure has been controlled at home, today their BP is    She does not workout. She denies chest pain, shortness of breath  Patient is following with Dr. Tomi Likens for memory issues and vertigo found to have everything normal, other than still following with eye doctor. She has had negative RF, CCP, antiDNA, Sed rate, Hep C, HIV, RPR, B12, iron/ferritin, TSH all normal.  Had normal CT 2013. H Had had vasovagal episode in April 2018 with Iv dye contrast.    She is not on cholesterol medication and denies myalgias. Her cholesterol is not at goal. The cholesterol last visit was:   Lab Results  Component Value Date   CHOL 244 (H) 02/14/2019   HDL 58 02/14/2019   LDLCALC 167 (H) 02/14/2019   LDLDIRECT 151.2 01/03/2008   TRIG 82 02/14/2019   CHOLHDL 4.2 02/14/2019   Last A1C in the office was:  Lab Results  Component Value Date   HGBA1C 4.8 06/28/2016   Patient is on Vitamin D supplement.   Lab Results  Component Value Date   VD25OH 36 10/16/2018       Current Medications:     Current Outpatient Medications (Respiratory):  Noelle Penner ALLERGY RELIEF, CETIRIZINE, 10 MG tablet, Take 1 tablet by mouth once daily .  promethazine (PHENERGAN) 25 MG tablet, Take 1 tablet (25 mg total) by mouth every 6 (six) hours as needed for nausea or vomiting (can cause fatigue). Max: 4 tablets per day .  triamcinolone (NASACORT) 55 MCG/ACT AERO  nasal inhaler, SPRAY 2 SPRAYS NASALLY DAILY  Current Outpatient Medications (Analgesics):  .  nabumetone (RELAFEN) 500 MG tablet, TAKE 1 TABLET TWICE A DAY AS NEEDED FOR MODERATE PAIN (AVOID WITH ALCOHOL)  Current Outpatient Medications (Hematological):  Marland Kitchen  Cyanocobalamin (VITAMIN B 12 PO), Take by mouth daily.  Current Outpatient Medications (Other):  Marland Kitchen  ALPRAZolam (XANAX) 0.5 MG tablet, 1/2-1 pill as needed daily for anxiety .  amphetamine-dextroamphetamine (ADDERALL) 10 MG tablet, Take 1 tablet (10 mg total) by mouth 2 (two) times daily with a meal. .  Cholecalciferol (VITAMIN D3 MAXIMUM STRENGTH) 5000 UNITS capsule, Take 5,000 Units by mouth daily. .  clotrimazole-betamethasone (LOTRISONE) cream, APPLY TO AFFECTED AREA TWICE A DAY .  cycloSPORINE (RESTASIS) 0.05 % ophthalmic emulsion, Place 1 drop into both eyes 2 (two) times daily. Marland Kitchen  escitalopram (LEXAPRO) 10 MG tablet, Take 1 tablet Daily for Mood .  Hyoscyamine Sulfate SL (LEVSIN/SL) 0.125 MG SUBL, Place 0.125 mg under the tongue every 4 (four) hours as needed. .  Omega-3 Fatty Acids (FISH OIL) 1200 MG CAPS, Take 1,200 mg by mouth daily. Marland Kitchen  OVER THE COUNTER MEDICATION, Turmeric daily .  pantoprazole (PROTONIX) 20 MG tablet, TTake 1 tablet Daily for Indigestion &  Reflux  Medical History:  Past Medical History:  Diagnosis Date  . ADD (attention deficit disorder)   . 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  BCC (basal cell carcinoma of skin) 04/30/2008   Right Neck  . Blurred vision   . 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 Seasonal allergies    Allergies:  Allergies  Allergen Reactions  . Iodinated Diagnostic Agents Anaphylaxis  . Erythromycin Nausea And Vomiting     Review of Systems:  Review of Systems  Constitutional: Negative.   HENT: Negative.   Eyes: Negative for blurred vision.  Respiratory: Negative.   Cardiovascular: Negative for chest pain, palpitations, orthopnea, claudication, leg swelling and  PND.  Gastrointestinal: Negative.   Genitourinary: Negative.   Musculoskeletal: Negative.   Skin: Negative.   Neurological: Negative for tingling, tremors, sensory change, speech change, focal weakness, seizures and loss of consciousness.  Endo/Heme/Allergies: Negative.   Psychiatric/Behavioral: Negative for depression, hallucinations, memory loss, substance abuse and suicidal ideas. The patient is nervous/anxious. The patient does not have insomnia.     Family history- Review and unchanged Social history- Review and unchanged Physical Exam: LMP 05/11/2012  Wt Readings from Last 3 Encounters:  02/14/19 127 lb 9.6 oz (57.9 kg)  10/16/18 126 lb 3.2 oz (57.2 kg)  08/21/17 125 lb (56.7 kg)   General Appearance: Well nourished, in no apparent distress. Eyes: PERRLA, EOMs, conjunctiva no swelling or erythema Sinuses: No Frontal/maxillary tenderness ENT/Mouth: Ext aud canals clear, TMs without erythema, bulging. No erythema, swelling, or exudate on post pharynx.  Tonsils not swollen or erythematous. Hearing normal.  Neck: Supple, thyroid normal.  Respiratory: Respiratory effort normal, BS equal bilaterally without rales, rhonchi, wheezing or stridor.  Cardio: Chest + tenderness on the left, RRR with no MRGs. Brisk peripheral pulses without edema.  Abdomen: Soft, + BS,  Non tender, no guarding, rebound, hernias, masses. Lymphatics: Non tender without lymphadenopathy.  Musculoskeletal: Full ROM, 5/5 strength, Normal gait Skin: Warm, dry without rashes, lesions, ecchymosis.  Neuro: Cranial nerves intact. Normal muscle tone, no cerebellar symptoms. Psych: Awake and oriented X 3, normal affect, Insight and Judgment appropriate.     Vicie Mutters, PA-C 8:18 AM [facility]

## 2019-07-16 ENCOUNTER — Ambulatory Visit: Payer: Managed Care, Other (non HMO) | Admitting: Physician Assistant

## 2019-07-21 ENCOUNTER — Other Ambulatory Visit: Payer: Self-pay | Admitting: Physician Assistant

## 2019-08-06 ENCOUNTER — Other Ambulatory Visit: Payer: Self-pay | Admitting: Internal Medicine

## 2019-10-21 NOTE — Progress Notes (Signed)
Complete Physical  Assessment and Plan: Hyperlipidemia -continue medications, check lipids, decrease fatty foods, increase activity.  - CBC with Differential/Platelet - BASIC METABOLIC PANEL WITH GFR - Hepatic function panel - Lipid panel - TSH - EKG 12-Lead   Dense breast tissue Get 3D MGM, overdue, given number   Basal cell carcinoma Follow up tafeen  Generalized anxiety disorder Continue medications  Gastroesophageal reflux disease, esophagitis presence not specified Continue PPI/H2 blocker, diet discussed  Screening for blood or protein in urine - Urinalysis, Routine w reflex microscopic (not at [facility]) - Microalbumin / creatinine urine ratio  Routine general medical examination at a health care facility 1 year   Medication management - Magnesium   Vitamin D deficiency - Vit D  25 hydroxy (rtn osteoporosis monitoring)  Attention deficit disorder, unspecified hyperactivity presence -     lisdexamfetamine (VYVANSE) 30 MG capsule; Take 1 capsule (30 mg total) by mouth daily. - patient states adderall give her some physical symptoms, will try vyvanse to see if this is better.   Screening for diabetes mellitus -     Hemoglobin A1c   Discussed med's effects and SE's. Screening labs and tests as requested with regular follow-up as recommended.  HPI 59 y.o. female  presents for a complete physical.  She is working from home, she is working all the time, she will have a glass of champagne and nuts. Some stress, going to lose her job in Dec.   BMI is Body mass index is 23.96 kg/m., she is working on diet and exercise. Wt Readings from Last 3 Encounters:  10/22/19 131 lb (59.4 kg)  02/14/19 127 lb 9.6 oz (57.9 kg)  10/16/18 126 lb 3.2 oz (57.2 kg)    Her blood pressure has been controlled at home, today their BP is BP: 120/84 She does not workout. She denies chest pain, shortness of breath, dizziness.  She is not on cholesterol medication and denies myalgias. Her  cholesterol is at goal. The cholesterol last visit was:   Lab Results  Component Value Date   CHOL 244 (H) 02/14/2019   HDL 58 02/14/2019   LDLCALC 167 (H) 02/14/2019   LDLDIRECT 151.2 01/03/2008   TRIG 82 02/14/2019   CHOLHDL 4.2 02/14/2019   Last A1C in the office was:  Lab Results  Component Value Date   HGBA1C 4.8 06/28/2016   Patient is on Vitamin D supplement.  Lab Results  Component Value Date   VD25OH 36 10/16/2018   Lab Results  Component Value Date   VITAMINB12 1,516 (H) 02/14/2019   Lab Results  Component Value Date   IRON 71 02/14/2019   TIBC 346 02/14/2019   FERRITIN 97 06/28/2016   Works at [employer], increased stress/time but has improved. 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  Has had feet/hand pain, has had negative work up for autoimmune and is on tumeric that is helping.   Current Medications:     Current Outpatient Medications (Respiratory):  .  cetirizine (EQ ALLERGY RELIEF, CETIRIZINE,) 10 MG tablet, Take 1 tablet Daily for Allergies .  promethazine (PHENERGAN) 25 MG tablet, Take 1 tablet (25 mg total) by mouth every 6 (six) hours as needed for nausea or vomiting (can cause fatigue). Max: 4 tablets per day .  triamcinolone (NASACORT) 55 MCG/ACT AERO nasal inhaler, SPRAY 2 SPRAYS NASALLY DAILY  Current Outpatient Medications (Analgesics):  .  nabumetone (RELAFEN) 500  MG tablet, Take 1 tablet 2 x /day with Food for Pain & Inflammation  Current Outpatient Medications (Hematological):  Marland Kitchen  Cyanocobalamin (VITAMIN B 12 PO), Take by mouth daily.  Current Outpatient Medications (Other):  Marland Kitchen  ALPRAZolam (XANAX) 0.5 MG tablet, 1/2-1 pill as needed daily for anxiety .  Cholecalciferol (VITAMIN D3 MAXIMUM STRENGTH) 5000 UNITS capsule, Take 5,000 Units by mouth daily. .  clotrimazole-betamethasone (LOTRISONE) cream, APPLY TO AFFECTED AREA TWICE A  DAY .  cycloSPORINE (RESTASIS) 0.05 % ophthalmic emulsion, Place 1 drop into both eyes 2 (two) times daily. Marland Kitchen  escitalopram (LEXAPRO) 10 MG tablet, Take 1 tablet Daily for Mood .  Hyoscyamine Sulfate SL (LEVSIN/SL) 0.125 MG SUBL, Place 0.125 mg under the tongue every 4 (four) hours as needed. .  Omega-3 Fatty Acids (FISH OIL) 1200 MG CAPS, Take 1,200 mg by mouth daily. Marland Kitchen  OVER THE COUNTER MEDICATION, Turmeric daily .  amphetamine-dextroamphetamine (ADDERALL) 10 MG tablet, Take 1 tablet (10 mg total) by mouth 2 (two) times daily with a meal.  Health Maintenance:   Immunization History  Administered Date(s) Administered  . Influenza Inj Mdck Quad Pf 09/29/2016  . Tdap 04/16/2012   Tetanus: 2013 Pneumovax: N/A Prevnar 13: due age 15 Flu vaccine: get flu shot Zostavax: due at 54 Pap: 2017 neg HPV, repeat 5 years MGM: 01/2019 cat D suggest 3D, DUE DEXA: Colonoscopy:2011 Dr. Oletta Lamas due 10 years Ct head 05/2012 EGD:2009  Medical History:  Past Medical History:  Diagnosis Date  . ADD (attention deficit disorder)   . 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 BCC (basal cell carcinoma of skin) 04/30/2008   Right Neck  . Blurred vision   . 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 Seasonal allergies    Allergies Allergies  Allergen Reactions  . Iodinated Diagnostic Agents Anaphylaxis  . Erythromycin Nausea And Vomiting    SURGICAL HISTORY She  has a past surgical history that includes Breast surgery; Cesarean section; Eye surgery; and Breast excisional biopsy (Bilateral).   FAMILY HISTORY Her family history includes Hypertension in her mother.   SOCIAL HISTORY She  reports that she has never smoked. She has never used smokeless tobacco. She reports current alcohol use. She reports that she does not use drugs.  Review of Systems  Constitutional: Negative.   HENT: Negative.   Eyes: Negative.   Respiratory: Negative.   Cardiovascular: Negative.   Gastrointestinal:  Negative.   Genitourinary: Negative.   Musculoskeletal: Negative.   Skin: Negative.   Neurological: Negative.  Negative for dizziness, tingling, tremors, sensory change, speech change, focal weakness, seizures and loss of consciousness.  Endo/Heme/Allergies: Negative.   Psychiatric/Behavioral: Positive for depression and memory loss. Negative for hallucinations, substance abuse and suicidal ideas. The patient is nervous/anxious and has insomnia.      Physical Exam: Estimated body mass index is 23.96 kg/m as calculated from the following:   Height as of this encounter: 5\' 2"  (1.575 m).   Weight as of this encounter: 131 lb (59.4 kg). BP 120/84   Pulse 86   Temp (!) 97.5 F (36.4 C)   Ht 5\' 2"  (1.575 m)   Wt 131 lb (59.4 kg)   LMP 05/11/2012   SpO2 96%   BMI 23.96 kg/m  General Appearance: Well nourished, in no apparent distress. Eyes: PERRLA, EOMs, conjunctiva no swelling or erythema, normal fundi and vessels. Sinuses: No Frontal/maxillary tenderness ENT/Mouth: Ext aud canals clear, normal light reflex with TMs without erythema,  bulging.  Good dentition. No erythema, swelling, or exudate on post pharynx. Tonsils not swollen or erythematous. Hearing normal.  Neck: Supple, thyroid normal. No bruits Respiratory: Respiratory effort normal, BS equal bilaterally without rales, rhonchi, wheezing or stridor. Cardio: RRR without murmurs, rubs or gallops. Brisk peripheral pulses without edema.  Chest: symmetric, with normal excursions and percussion. Breasts: Symmetric, without lumps, nipple discharge, retractions. Abdomen: Soft, +BS. Non tender, no guarding, rebound, hernias, masses, or organomegaly. .  Lymphatics: Non tender without lymphadenopathy.  Genitourinary: decline Musculoskeletal: Full ROM all peripheral extremities,5/5 strength, and normal gait. Skin: Warm, dry without rashes, lesions, ecchymosis.  Neuro: Cranial nerves intact, reflexes equal bilaterally. Normal muscle tone,  no cerebellar symptoms. Sensation intact.  Psych: Awake and oriented X 3, normal affect, Insight and Judgment appropriate.   EKG:  WNL, PRWP, no ST changes   Vicie Mutters 9:25 AM

## 2019-10-22 ENCOUNTER — Ambulatory Visit (INDEPENDENT_AMBULATORY_CARE_PROVIDER_SITE_OTHER): Payer: Managed Care, Other (non HMO) | Admitting: Physician Assistant

## 2019-10-22 ENCOUNTER — Other Ambulatory Visit: Payer: Self-pay

## 2019-10-22 ENCOUNTER — Encounter: Payer: Self-pay | Admitting: Physician Assistant

## 2019-10-22 VITALS — BP 120/84 | HR 86 | Temp 97.5°F | Ht 62.0 in | Wt 131.0 lb

## 2019-10-22 DIAGNOSIS — K219 Gastro-esophageal reflux disease without esophagitis: Secondary | ICD-10-CM

## 2019-10-22 DIAGNOSIS — Z13220 Encounter for screening for lipoid disorders: Secondary | ICD-10-CM

## 2019-10-22 DIAGNOSIS — I10 Essential (primary) hypertension: Secondary | ICD-10-CM

## 2019-10-22 DIAGNOSIS — F988 Other specified behavioral and emotional disorders with onset usually occurring in childhood and adolescence: Secondary | ICD-10-CM

## 2019-10-22 DIAGNOSIS — Z79899 Other long term (current) drug therapy: Secondary | ICD-10-CM | POA: Diagnosis not present

## 2019-10-22 DIAGNOSIS — Z131 Encounter for screening for diabetes mellitus: Secondary | ICD-10-CM

## 2019-10-22 DIAGNOSIS — Z130 Encounter for screening for diseases of the blood and blood-forming organs and certain disorders involving the immune mechanism: Secondary | ICD-10-CM

## 2019-10-22 DIAGNOSIS — J309 Allergic rhinitis, unspecified: Secondary | ICD-10-CM

## 2019-10-22 DIAGNOSIS — Z1389 Encounter for screening for other disorder: Secondary | ICD-10-CM

## 2019-10-22 DIAGNOSIS — Z0000 Encounter for general adult medical examination without abnormal findings: Secondary | ICD-10-CM

## 2019-10-22 DIAGNOSIS — Z136 Encounter for screening for cardiovascular disorders: Secondary | ICD-10-CM | POA: Diagnosis not present

## 2019-10-22 DIAGNOSIS — C4491 Basal cell carcinoma of skin, unspecified: Secondary | ICD-10-CM

## 2019-10-22 DIAGNOSIS — R922 Inconclusive mammogram: Secondary | ICD-10-CM

## 2019-10-22 DIAGNOSIS — E559 Vitamin D deficiency, unspecified: Secondary | ICD-10-CM | POA: Diagnosis not present

## 2019-10-22 DIAGNOSIS — E785 Hyperlipidemia, unspecified: Secondary | ICD-10-CM

## 2019-10-22 DIAGNOSIS — F411 Generalized anxiety disorder: Secondary | ICD-10-CM

## 2019-10-22 DIAGNOSIS — Z0001 Encounter for general adult medical examination with abnormal findings: Secondary | ICD-10-CM

## 2019-10-22 MED ORDER — LISDEXAMFETAMINE DIMESYLATE 30 MG PO CAPS: 30.0000 mg | ORAL_CAPSULE | Freq: Every day | ORAL | 0 refills | Status: DC

## 2019-10-22 NOTE — Patient Instructions (Addendum)
Your LDL could improve, ideally we want it under a 100.  Your LDL is the bad cholesterol that can lead to heart attack and stroke. To lower your number you can decrease your fatty foods, red meat, cheese, milk and increase fiber like whole grains and veggies. You can also add a fiber supplement like Citracel or Benefiber, these do not cause gas and bloating and are safe to use. Especially if you have a strong family history of heart disease or stroke or you have evidence of plaque on any imaging like a chest xray, we may discuss at your next office visit putting you on a medication to get your number below 100.   HOW TO SCHEDULE A MAMMOGRAM  [facility]  7 a.m.-6:30 p.m., Monday 7 a.m.-5 p.m., Tuesday-Friday Schedule an appointment by calling [phone]   You are due for your colonoscopy this year  Colon cancer is 3rd most diagnosed cancer and 2nd leading cause of death in both men and women 60 years of age and older despite being one of the most preventable and treatable cancers if found early.  4 of out 5 people diagnosed with colon cancer have NO prior family history.  When caught EARLY 90% of colon cancer is curable.   Check out  Mini habits for weight loss book  2 free apps for tracking food is myfitness pal  loseit  If you want more structured weight loss that you have to pay for, you can look into  Noom  weight watchers  Try to take some time for yourself!  General eating tips  What to Avoid . Avoid added sugars o Often added sugar can be found in processed foods such as many condiments, dry cereals, cakes, cookies, chips, crisps, crackers, candies, sweetened drinks, etc.  o Read labels and AVOID/DECREASE use of foods with the following in their ingredient list: Sugar, fructose, high fructose corn syrup, sucrose, glucose, maltose, dextrose, molasses, cane sugar, brown sugar, any type of syrup, agave nectar, etc.   . Avoid snacking in between  meals- drink water or if you feel you need a snack, pick a high water content snack such as cucumbers, watermelon, or any veggie.  Marland Kitchen Avoid foods made with flour o If you are going to eat food made with flour, choose those made with whole-grains; and, minimize your consumption as much as is tolerable . Avoid processed foods o These foods are generally stocked in the middle of the grocery store.  o Focus on shopping on the perimeter of the grocery.  What to Include . Vegetables o GREEN LEAFY VEGETABLES: Kale, spinach, mustard greens, collard greens, cabbage, broccoli, etc. o OTHER: Asparagus, cauliflower, eggplant, carrots, peas, Brussel sprouts, tomatoes, bell peppers, zucchini, beets, cucumbers, etc. . Grains, seeds, and legumes o Beans: kidney beans, black eyed peas, garbanzo beans, black beans, pinto beans, etc. o Whole, unrefined grains: brown rice, barley, bulgur, oatmeal, etc. . Healthy fats  o Avoid highly processed fats such as vegetable oil o Examples of healthy fats: avocado, olives, virgin olive oil, dark chocolate (?72% Cocoa), nuts (peanuts, almonds, walnuts, cashews, pecans, etc.) o Please still do small amount of these healthy fats, they are dense in calories.  . Low - Moderate Intake of Animal Sources of Protein o Meat sources: chicken, [place], salmon, tuna. Limit to 4 ounces of meat at one time or the size of your palm. o Consider limiting dairy sources, but when choosing dairy focus on: PLAIN [place] yogurt,  cottage cheese, high-protein milk . Fruit o Choose berries   Nuts are a healthy fat that can help you feel full HOWEVER they pack a lot of calories in a small amount. For weight loss, I suggest no more than 1 serving of nuts a day. A handful goes a long way. Here are some examples of the different types of nuts and the suggested serving a day.

## 2019-10-23 LAB — CBC WITH DIFFERENTIAL/PLATELET
Absolute Monocytes: 376 cells/uL (ref 200–950)
Basophils Absolute: 28 cells/uL (ref 0–200)
Basophils Relative: 0.6 %
Eosinophils Absolute: 179 cells/uL (ref 15–500)
Eosinophils Relative: 3.8 %
HCT: 41.7 % (ref 35.0–45.0)
Hemoglobin: 14.1 g/dL (ref 11.7–15.5)
Lymphs Abs: 1622 cells/uL (ref 850–3900)
MCH: 30.7 pg (ref 27.0–33.0)
MCHC: 33.8 g/dL (ref 32.0–36.0)
MCV: 90.8 fL (ref 80.0–100.0)
MPV: 11 fL (ref 7.5–12.5)
Monocytes Relative: 8 %
Neutro Abs: 2496 cells/uL (ref 1500–7800)
Neutrophils Relative %: 53.1 %
Platelets: 249 10*3/uL (ref 140–400)
RBC: 4.59 10*6/uL (ref 3.80–5.10)
RDW: 12.4 % (ref 11.0–15.0)
Total Lymphocyte: 34.5 %
WBC: 4.7 10*3/uL (ref 3.8–10.8)

## 2019-10-23 LAB — HEMOGLOBIN A1C
Hgb A1c MFr Bld: 5 % of total Hgb (ref <5.7)
Mean Plasma Glucose: 97 (calc)
eAG (mmol/L): 5.4 (calc)

## 2019-10-23 LAB — LIPID PANEL
Cholesterol: 245 mg/dL — ABNORMAL HIGH (ref <200)
HDL: 55 mg/dL (ref >=50)
LDL Cholesterol (Calc): 164 mg/dL (calc) — ABNORMAL HIGH
Non-HDL Cholesterol (Calc): 190 mg/dL (calc) — ABNORMAL HIGH (ref <130)
Total CHOL/HDL Ratio: 4.5 (calc) (ref <5.0)
Triglycerides: 126 mg/dL (ref <150)

## 2019-10-23 LAB — COMPLETE METABOLIC PANEL WITH GFR
AG Ratio: 1.8 (calc) (ref 1.0–2.5)
ALT: 14 U/L (ref 6–29)
AST: 18 U/L (ref 10–35)
Albumin: 4.2 g/dL (ref 3.6–5.1)
Alkaline phosphatase (APISO): 99 U/L (ref 37–153)
BUN: 14 mg/dL (ref 7–25)
CO2: 26 mmol/L (ref 20–32)
Calcium: 9.1 mg/dL (ref 8.6–10.4)
Chloride: 105 mmol/L (ref 98–110)
Creat: 0.67 mg/dL (ref 0.50–0.99)
GFR, Est African American: 111 mL/min/{1.73_m2} (ref >=60)
GFR, Est Non African American: 96 mL/min/{1.73_m2} (ref >=60)
Globulin: 2.4 g/dL (calc) (ref 1.9–3.7)
Glucose, Bld: 87 mg/dL (ref 65–99)
Potassium: 4.2 mmol/L (ref 3.5–5.3)
Sodium: 140 mmol/L (ref 135–146)
Total Bilirubin: 0.3 mg/dL (ref 0.2–1.2)
Total Protein: 6.6 g/dL (ref 6.1–8.1)

## 2019-10-23 LAB — URINALYSIS, ROUTINE W REFLEX MICROSCOPIC
Bacteria, UA: NONE SEEN /HPF
Bilirubin Urine: NEGATIVE
Glucose, UA: NEGATIVE
Hgb urine dipstick: NEGATIVE
Hyaline Cast: NONE SEEN /LPF
Ketones, ur: NEGATIVE
Nitrite: NEGATIVE
Protein, ur: NEGATIVE
Specific Gravity, Urine: 1.018 (ref 1.001–1.03)
Squamous Epithelial / HPF: NONE SEEN /HPF (ref <=5)
pH: <=5 (ref 5.0–8.0)

## 2019-10-23 LAB — MICROALBUMIN / CREATININE URINE RATIO
Creatinine, Urine: 148 mg/dL (ref 20–275)
Microalb Creat Ratio: 6 mcg/mg creat (ref <30)
Microalb, Ur: 0.9 mg/dL

## 2019-10-23 LAB — MAGNESIUM: Magnesium: 2 mg/dL (ref 1.5–2.5)

## 2019-10-23 LAB — TSH: TSH: 1.51 mIU/L (ref 0.40–4.50)

## 2019-10-23 LAB — VITAMIN D 25 HYDROXY (VIT D DEFICIENCY, FRACTURES): Vit D, 25-Hydroxy: 36 ng/mL (ref 30–100)

## 2019-11-07 ENCOUNTER — Ambulatory Visit: Payer: Managed Care, Other (non HMO) | Attending: Internal Medicine

## 2019-11-07 DIAGNOSIS — Z23 Encounter for immunization: Secondary | ICD-10-CM

## 2019-11-07 NOTE — Progress Notes (Signed)
   Covid-19 Vaccination Clinic  Name:  RANIQUE QUILLEN    MRN: [id number] DOB: Dec 13, 1960  11/07/2019  Ms. Stobaugh was observed post Covid-19 immunization for 15 minutes without incident. She was provided with Vaccine Information Sheet and instruction to access the V-Safe system.   Ms. Aniello was instructed to call 911 with any severe reactions post vaccine: Marland Kitchen Difficulty breathing  . Swelling of face and throat  . A fast heartbeat  . A bad rash all over body  . Dizziness and weakness   Immunizations Administered    Name Date Dose VIS Date Route   Pfizer COVID-19 Vaccine 11/07/2019  4:54 PM 0.3 mL 07/19/2019 Intramuscular   Manufacturer: [place]   Lot: [id number]   [place]: [id number]

## 2019-12-02 ENCOUNTER — Ambulatory Visit: Payer: Managed Care, Other (non HMO) | Attending: Internal Medicine

## 2019-12-02 DIAGNOSIS — Z23 Encounter for immunization: Secondary | ICD-10-CM

## 2019-12-02 NOTE — Progress Notes (Signed)
   Covid-19 Vaccination Clinic  Name:  Amanda Salazar    MRN: [id number] DOB: 1961/07/06  12/02/2019  [name] was observed post Covid-19 immunization for 15 minutes without incident. She was provided with Vaccine Information Sheet and instruction to access the V-Safe system.   [name] was instructed to call 911 with any severe reactions post vaccine: Marland Kitchen Difficulty breathing  . Swelling of face and throat  . A fast heartbeat  . A bad rash all over body  . Dizziness and weakness   Immunizations Administered    Name Date Dose VIS Date Route   Pfizer COVID-19 Vaccine 12/02/2019  4:37 PM 0.3 mL 10/02/2018 Intramuscular   Manufacturer: [place]   Lot: [id number]   [place]: [id number]

## 2019-12-23 ENCOUNTER — Other Ambulatory Visit: Payer: Self-pay

## 2019-12-23 ENCOUNTER — Encounter (INDEPENDENT_AMBULATORY_CARE_PROVIDER_SITE_OTHER): Payer: Managed Care, Other (non HMO) | Admitting: Ophthalmology

## 2019-12-23 DIAGNOSIS — H43813 Vitreous degeneration, bilateral: Secondary | ICD-10-CM

## 2019-12-23 DIAGNOSIS — H2513 Age-related nuclear cataract, bilateral: Secondary | ICD-10-CM

## 2019-12-23 DIAGNOSIS — H33303 Unspecified retinal break, bilateral: Secondary | ICD-10-CM | POA: Diagnosis not present

## 2020-01-09 ENCOUNTER — Other Ambulatory Visit: Payer: Self-pay | Admitting: Internal Medicine

## 2020-01-09 DIAGNOSIS — F411 Generalized anxiety disorder: Secondary | ICD-10-CM

## 2020-01-20 ENCOUNTER — Encounter (INDEPENDENT_AMBULATORY_CARE_PROVIDER_SITE_OTHER): Payer: Managed Care, Other (non HMO) | Admitting: Ophthalmology

## 2020-01-22 ENCOUNTER — Encounter (INDEPENDENT_AMBULATORY_CARE_PROVIDER_SITE_OTHER): Payer: Managed Care, Other (non HMO) | Admitting: Ophthalmology

## 2020-02-05 ENCOUNTER — Telehealth: Payer: Self-pay | Admitting: Physician Assistant

## 2020-02-05 MED ORDER — NITROFURANTOIN MONOHYD MACRO 100 MG PO CAPS: 100.0000 mg | ORAL_CAPSULE | Freq: Two times a day (BID) | ORAL | 0 refills | Status: DC

## 2020-02-05 NOTE — Telephone Encounter (Signed)
Patient has history of UTI, called answering service with UTI symptoms at night. Her husband is having surgery for brain aneurysm  Will send in [place], if she is not better please make an office visit.

## 2020-02-17 NOTE — Progress Notes (Signed)
Assessment and Plan:  Hyperlipidemia, unspecified hyperlipidemia type -     Lipid panel  Attention deficit disorder, unspecified hyperactivity presence -     TSH -     lisdexamfetamine (VYVANSE) 30 MG capsule; Take 1 capsule (30 mg total) by mouth daily.  Generalized anxiety disorder -     TSH  Gastroesophageal reflux disease, unspecified whether esophagitis present  Medication management -     CBC with Differential/Platelet -     COMPLETE METABOLIC PANEL WITH GFR -     Magnesium  Vitamin D deficiency -     VITAMIN D 25 Hydroxy (Vit-D Deficiency, Fractures)  Recurrent UTI -     Urinalysis, Routine w reflex microscopic -     Urine Culture -     nitrofurantoin, macrocrystal-monohydrate, (MACROBID) 100 MG capsule; Take 1 capsule (100 mg total) by mouth 2 (two) times daily.  Hair loss ? telogen effluvium from stress will check labs- if not better will refer to DERM -     Iron,Total/Total Iron Binding Cap -     Ferritin -     Vitamin B12 -     ANA -     Anti-Smith antibody -     Testosterone, Total, LC/MS/MS  Discussed med's effects and SE's. Screening labs and tests as requested with regular follow-up as recommended. Future Appointments  Date Time Provider [place]  03/27/2020  2:45 PM [name], MD [facility] None  10/21/2020  9:00 AM Vicie Mutters, PA-C [facility] None    HPI 59 y.o. female  presents for follow up.    She is working from home, she is working all the time.  Husband had brain aneurysm and had surgery recently coiling, had small stroke in May.   She was sent in [place] 06/30 for UTI symptoms. She will have urgency, small void, pain. She will sit in warm water and that will helps some. Has had repeated.   She has had thinning hair x year, she did get extension for a few years. States mainly around the crown and top of her head.  No hair thinning in her family. She is on biotin.   Has had feet/hand pain, has had negative work up for  autoimmune and is on tumeric that is helping. She has been having left sided costochondritis and some left shoulder/neck pain- worse with posture at work- can cause flare, never with exertion. Tumeric and NSAIDS help.     BMI is Body mass index is 23.23 kg/m., she is working on diet and exercise. Wt Readings from Last 3 Encounters:  02/19/20 127 lb (57.6 kg)  10/22/19 131 lb (59.4 kg)  02/14/19 127 lb 9.6 oz (57.9 kg)    Her blood pressure has been controlled at home, today their BP is BP: 110/62 She does not workout. She denies chest pain, shortness of breath, dizziness.  She is not on cholesterol medication and denies myalgias. Her cholesterol is at goal. The cholesterol last visit was:   Lab Results  Component Value Date   CHOL 245 (H) 10/22/2019   HDL 55 10/22/2019   LDLCALC 164 (H) 10/22/2019   LDLDIRECT 151.2 01/03/2008   TRIG 126 10/22/2019   CHOLHDL 4.5 10/22/2019   Last A1C in the office was:  Lab Results  Component Value Date   HGBA1C 5.0 10/22/2019   Patient is on Vitamin D supplement.  Lab Results  Component Value Date   VD25OH 36 10/22/2019   Works at [employer], increased stress/time but has improved.  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   Lab Results  Component Value Date   TSH 1.51 10/22/2019    Current Medications:     Current Outpatient Medications (Respiratory):  .  cetirizine (EQ ALLERGY RELIEF, CETIRIZINE,) 10 MG tablet, Take 1 tablet Daily for Allergies .  promethazine (PHENERGAN) 25 MG tablet, Take 1 tablet (25 mg total) by mouth every 6 (six) hours as needed for nausea or vomiting (can cause fatigue). Max: 4 tablets per day .  triamcinolone (NASACORT) 55 MCG/ACT AERO nasal inhaler, SPRAY 2 SPRAYS NASALLY DAILY  Current Outpatient Medications (Analgesics):  .  nabumetone (RELAFEN) 500 MG tablet, Take 1 tablet 2 x /day with Food for Pain &  Inflammation  Current Outpatient Medications (Hematological):  Marland Kitchen  Cyanocobalamin (VITAMIN B 12 PO), Take by mouth daily.  Current Outpatient Medications (Other):  Marland Kitchen  ALPRAZolam (XANAX) 0.5 MG tablet, 1/2-1 pill as needed daily for anxiety .  Cholecalciferol (VITAMIN D3 MAXIMUM STRENGTH) 5000 UNITS capsule, Take 5,000 Units by mouth daily. .  clotrimazole-betamethasone (LOTRISONE) cream, APPLY TO AFFECTED AREA TWICE A DAY .  cycloSPORINE (RESTASIS) 0.05 % ophthalmic emulsion, Place 1 drop into both eyes 2 (two) times daily. Marland Kitchen  escitalopram (LEXAPRO) 10 MG tablet, Take 1 tablet Daily for Mood .  Hyoscyamine Sulfate SL (LEVSIN/SL) 0.125 MG SUBL, Place 0.125 mg under the tongue every 4 (four) hours as needed. Marland Kitchen  lisdexamfetamine (VYVANSE) 30 MG capsule, Take 1 capsule (30 mg total) by mouth daily. .  Omega-3 Fatty Acids (FISH OIL) 1200 MG CAPS, Take 1,200 mg by mouth daily. Marland Kitchen  OVER THE COUNTER MEDICATION, Turmeric daily   Medical History:  Past Medical History:  Diagnosis Date  . ADD (attention deficit disorder)   . 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 BCC (basal cell carcinoma of skin) 04/30/2008   Right Neck  . Blurred vision   . 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 Seasonal allergies    Allergies Allergies  Allergen Reactions  . Iodinated Diagnostic Agents Anaphylaxis  . Erythromycin Nausea And Vomiting   Surgical History: reviewed and unchanged Family History: reviewed and unchanged Social History: reviewed and unchanged  Review of Systems  Constitutional: Negative.   HENT: Negative.   Eyes: Negative.   Respiratory: Negative.   Cardiovascular: Negative.   Gastrointestinal: Negative.   Genitourinary: Negative.   Musculoskeletal: Negative.   Skin: Negative.   Neurological: Negative.  Negative for dizziness, tingling, tremors, sensory change, speech change, focal weakness, seizures and loss of consciousness.  Endo/Heme/Allergies: Negative.    Psychiatric/Behavioral: Positive for depression and memory loss. Negative for hallucinations, substance abuse and suicidal ideas. The patient is nervous/anxious and has insomnia.      Physical Exam: Estimated body mass index is 23.23 kg/m as calculated from the following:   Height as of 10/22/19: 5\' 2"  (1.575 m).   Weight as of this encounter: 127 lb (57.6 kg). BP 110/62   Pulse 88   Temp (!) 97.5 F (36.4 C)   Wt 127 lb (57.6 kg)   LMP 05/11/2012   SpO2 98%   BMI 23.23 kg/m  General Appearance: Well nourished, in no apparent distress. Eyes: PERRLA, EOMs, conjunctiva no swelling or erythema, normal fundi and vessels. Sinuses: No Frontal/maxillary tenderness ENT/Mouth: Ext aud canals clear, normal light reflex with TMs without erythema, bulging.  Good dentition. No erythema, swelling,  or exudate on post pharynx. Tonsils not swollen or erythematous. Hearing normal.  Neck: Supple, thyroid normal. No bruits Respiratory: Respiratory effort normal, BS equal bilaterally without rales, rhonchi, wheezing or stridor. Cardio: RRR without murmurs, rubs or gallops. Brisk peripheral pulses without edema.  Chest: symmetric, with normal excursions and percussion. Abdomen: Soft, +BS. Non tender, no guarding, rebound, hernias, masses, or organomegaly. .  Lymphatics: Non tender without lymphadenopathy.  Musculoskeletal: Full ROM all peripheral extremities,5/5 strength, and normal gait. Skin: Warm, dry without rashes, lesions, ecchymosis.  Neuro: Cranial nerves intact, reflexes equal bilaterally. Normal muscle tone, no cerebellar symptoms. Sensation intact.  Psych: Awake and oriented X 3, normal affect, Insight and Judgment appropriate.   Future Appointments  Date Time Provider [place]  03/27/2020  2:45 PM [name], MD [facility] None  06/22/2020  4:00 PM Vicie Mutters, PA-C [facility] None  10/21/2020  9:00 AM Vicie Mutters, PA-C [facility] None     Vicie Mutters 3:59 PM

## 2020-02-19 ENCOUNTER — Ambulatory Visit (INDEPENDENT_AMBULATORY_CARE_PROVIDER_SITE_OTHER): Payer: Managed Care, Other (non HMO) | Admitting: Physician Assistant

## 2020-02-19 ENCOUNTER — Other Ambulatory Visit: Payer: Self-pay

## 2020-02-19 ENCOUNTER — Encounter: Payer: Self-pay | Admitting: Physician Assistant

## 2020-02-19 VITALS — BP 110/62 | HR 88 | Temp 97.5°F | Wt 127.0 lb

## 2020-02-19 DIAGNOSIS — F988 Other specified behavioral and emotional disorders with onset usually occurring in childhood and adolescence: Secondary | ICD-10-CM

## 2020-02-19 DIAGNOSIS — E785 Hyperlipidemia, unspecified: Secondary | ICD-10-CM | POA: Diagnosis not present

## 2020-02-19 DIAGNOSIS — F411 Generalized anxiety disorder: Secondary | ICD-10-CM

## 2020-02-19 DIAGNOSIS — E559 Vitamin D deficiency, unspecified: Secondary | ICD-10-CM | POA: Diagnosis not present

## 2020-02-19 DIAGNOSIS — K219 Gastro-esophageal reflux disease without esophagitis: Secondary | ICD-10-CM | POA: Diagnosis not present

## 2020-02-19 DIAGNOSIS — N390 Urinary tract infection, site not specified: Secondary | ICD-10-CM

## 2020-02-19 DIAGNOSIS — L659 Nonscarring hair loss, unspecified: Secondary | ICD-10-CM

## 2020-02-19 DIAGNOSIS — Z79899 Other long term (current) drug therapy: Secondary | ICD-10-CM

## 2020-02-19 MED ORDER — LISDEXAMFETAMINE DIMESYLATE 30 MG PO CAPS: 30.0000 mg | ORAL_CAPSULE | Freq: Every day | ORAL | 0 refills | Status: DC

## 2020-02-19 MED ORDER — NITROFURANTOIN MONOHYD MACRO 100 MG PO CAPS: 100.0000 mg | ORAL_CAPSULE | Freq: Two times a day (BID) | ORAL | 1 refills | Status: DC

## 2020-02-19 NOTE — Patient Instructions (Addendum)
telogen effluvium   Alopecia Areata, Adult  Alopecia areata is a condition that causes you to lose hair. You may lose hair on your scalp in patches. In some cases, you may lose all the hair on your scalp (alopecia totalis) or all the hair from your face and body (alopecia universalis). Alopecia areata is an autoimmune disease. This means that your body's defense system (immune system) mistakes normal parts of the body for germs or other things that can make you sick. When you have alopecia areata, the immune system attacks the hair follicles. Alopecia areata usually develops in childhood, but it can develop at any age. For some people, their hair grows back on its own and hair loss does not happen again. For others, their hair may fall out and grow back in cycles. The hair loss may last many years. Having this condition can be emotionally difficult, but it is not dangerous. What are the causes? The cause of this condition is not known. What increases the risk? This condition is more likely to develop in people who have:  A family history of alopecia.  A family history of another autoimmune disease, including type 1 diabetes and rheumatoid arthritis.  Asthma and allergies.  Down syndrome. What are the signs or symptoms? Round spots of patchy hair loss on the scalp is the main symptom of this condition. The spots may be mildly itchy. Other symptoms include:  Short dark hairs in the bald patches that are wider at the top (exclamation point hairs).  Dents, white spots, or lines in the fingernails or toenails.  Balding and body hair loss. This is rare. How is this diagnosed? This condition is diagnosed based on your symptoms and family history. Your health care provider will also check your scalp skin, teeth, and nails. Your health care provider may refer you to a specialist in hair and skin disorders (dermatologist). You may also have tests, including:  A hair pull test.  Blood tests  or other screening tests to check for autoimmune diseases, such as thyroid disease or diabetes.  Skin biopsy to confirm the diagnosis.  A procedure to examine the skin with a lighted magnifying instrument (dermoscopy). How is this treated? There is no cure for alopecia areata. Treatment is aimed at promoting the regrowth of hair and preventing the immune system from overreacting. No single treatment is right for all people with alopecia areata. It depends on the type of hair loss you have and how severe it is. Work with your health care provider to find the best treatment for you. Treatment may include:  Having regular checkups to make sure the condition is not getting worse (watchful waiting).  Steroid creams or pills for 6-8 weeks to stop the immune reaction and help hair to regrow more quickly.  Other topical medicines to alter the immune system response and support the hair growth cycle.  Steroid injections.  Therapy and counseling with a support group or therapist if you are having trouble coping with hair loss. Follow these instructions at home:  Learn as much as you can about your condition.  Apply topical creams only as told by your health care provider.  Take over-the-counter and prescription medicines only as told by your health care provider.  Consider getting a wig or products to make hair look fuller or to cover bald spots, if you feel uncomfortable with your appearance.  Get therapy or counseling if you are having a hard time coping with hair loss. Ask your health  care provider to recommend a counselor or support group.  Keep all follow-up visits as told by your health care provider. This is important. Contact a health care provider if:  Your hair loss gets worse, even with treatment.  You have new symptoms.  You are struggling emotionally. Summary  Alopecia areata is an autoimmune condition that makes your body's defense system (immune system) attack the hair  follicles. This causes you to lose hair.  Treatments may include regular checkups to make sure that the condition is not getting worse (watchful waiting), medicines, and steroid injections. This information is not intended to replace advice given to you by your health care provider. Make sure you discuss any questions you have with your health care provider. Document Revised: 07/07/2017 Document Reviewed: 08/12/2016 Elsevier Patient Education  2020 [employer].

## 2020-02-20 LAB — COMPLETE METABOLIC PANEL WITH GFR
AG Ratio: 1.9 (calc) (ref 1.0–2.5)
ALT: 13 U/L (ref 6–29)
AST: 17 U/L (ref 10–35)
Albumin: 4.4 g/dL (ref 3.6–5.1)
Alkaline phosphatase (APISO): 112 U/L (ref 37–153)
BUN: 15 mg/dL (ref 7–25)
CO2: 28 mmol/L (ref 20–32)
Calcium: 9.7 mg/dL (ref 8.6–10.4)
Chloride: 108 mmol/L (ref 98–110)
Creat: 0.99 mg/dL (ref 0.50–0.99)
GFR, Est African American: 72 mL/min/{1.73_m2} (ref >=60)
GFR, Est Non African American: 62 mL/min/{1.73_m2} (ref >=60)
Globulin: 2.3 g/dL (calc) (ref 1.9–3.7)
Glucose, Bld: 73 mg/dL (ref 65–99)
Potassium: 4.8 mmol/L (ref 3.5–5.3)
Sodium: 144 mmol/L (ref 135–146)
Total Bilirubin: 0.3 mg/dL (ref 0.2–1.2)
Total Protein: 6.7 g/dL (ref 6.1–8.1)

## 2020-02-20 LAB — CBC WITH DIFFERENTIAL/PLATELET
Absolute Monocytes: 433 cells/uL (ref 200–950)
Basophils Absolute: 31 cells/uL (ref 0–200)
Basophils Relative: 0.5 %
Eosinophils Absolute: 183 cells/uL (ref 15–500)
Eosinophils Relative: 3 %
HCT: 40.1 % (ref 35.0–45.0)
Hemoglobin: 13.9 g/dL (ref 11.7–15.5)
Lymphs Abs: 1739 cells/uL (ref 850–3900)
MCH: 31.7 pg (ref 27.0–33.0)
MCHC: 34.7 g/dL (ref 32.0–36.0)
MCV: 91.3 fL (ref 80.0–100.0)
MPV: 11.2 fL (ref 7.5–12.5)
Monocytes Relative: 7.1 %
Neutro Abs: 3715 cells/uL (ref 1500–7800)
Neutrophils Relative %: 60.9 %
Platelets: 266 10*3/uL (ref 140–400)
RBC: 4.39 10*6/uL (ref 3.80–5.10)
RDW: 12.5 % (ref 11.0–15.0)
Total Lymphocyte: 28.5 %
WBC: 6.1 10*3/uL (ref 3.8–10.8)

## 2020-02-20 LAB — URINALYSIS, ROUTINE W REFLEX MICROSCOPIC
Bilirubin Urine: NEGATIVE
Glucose, UA: NEGATIVE
Hgb urine dipstick: NEGATIVE
Ketones, ur: NEGATIVE
Leukocytes,Ua: NEGATIVE
Nitrite: NEGATIVE
Protein, ur: NEGATIVE
Specific Gravity, Urine: 1.013 (ref 1.001–1.03)
pH: 8 (ref 5.0–8.0)

## 2020-02-20 LAB — LIPID PANEL
Cholesterol: 257 mg/dL — ABNORMAL HIGH (ref <200)
HDL: 53 mg/dL (ref >=50)
LDL Cholesterol (Calc): 167 mg/dL (calc) — ABNORMAL HIGH
Non-HDL Cholesterol (Calc): 204 mg/dL (calc) — ABNORMAL HIGH (ref <130)
Total CHOL/HDL Ratio: 4.8 (calc) (ref <5.0)
Triglycerides: 209 mg/dL — ABNORMAL HIGH (ref <150)

## 2020-02-20 LAB — URINE CULTURE
MICRO NUMBER:: 10705308
SPECIMEN QUALITY:: ADEQUATE

## 2020-02-20 LAB — MAGNESIUM: Magnesium: 2.2 mg/dL (ref 1.5–2.5)

## 2020-02-20 LAB — TSH: TSH: 1.24 mIU/L (ref 0.40–4.50)

## 2020-02-20 LAB — VITAMIN D 25 HYDROXY (VIT D DEFICIENCY, FRACTURES): Vit D, 25-Hydroxy: 40 ng/mL (ref 30–100)

## 2020-02-25 LAB — IRON, TOTAL/TOTAL IRON BINDING CAP
%SAT: 34 % (calc) (ref 16–45)
Iron: 114 ug/dL (ref 45–160)
TIBC: 331 mcg/dL (calc) (ref 250–450)

## 2020-02-25 LAB — VITAMIN B12: Vitamin B-12: 1057 pg/mL (ref 200–1100)

## 2020-02-25 LAB — FERRITIN: Ferritin: 87 ng/mL (ref 16–232)

## 2020-02-25 LAB — TESTOSTERONE, TOTAL, LC/MS/MS: Testosterone, Total, LC-MS-MS: 5 ng/dL (ref 2–45)

## 2020-02-25 LAB — ANTI-SMITH ANTIBODY: ENA SM Ab Ser-aCnc: <1 AI

## 2020-02-25 LAB — ANTI-NUCLEAR AB-TITER (ANA TITER): ANA Titer 1: 1:80 {titer} — ABNORMAL HIGH

## 2020-02-25 LAB — ANA: Anti Nuclear Antibody (ANA): POSITIVE — AB

## 2020-03-26 ENCOUNTER — Telehealth: Payer: Self-pay | Admitting: Physician Assistant

## 2020-03-26 DIAGNOSIS — F988 Other specified behavioral and emotional disorders with onset usually occurring in childhood and adolescence: Secondary | ICD-10-CM

## 2020-03-26 MED ORDER — LISDEXAMFETAMINE DIMESYLATE 30 MG PO CAPS: 30.0000 mg | ORAL_CAPSULE | Freq: Every day | ORAL | 0 refills | Status: DC

## 2020-03-26 NOTE — Addendum Note (Signed)
Addended by: Vicie Mutters R on: 03/26/2020 01:40 PM   Modules accepted: Orders

## 2020-03-26 NOTE — Telephone Encounter (Signed)
Patient is calling and asking for a refill of Vyvanse [facility] - [address], [address] [place] [address] Phone: [phone] Fax: [phone]  [id number] [place], [place]. AT [address] [place] [address] Phone: [phone] Fax: [phone]  [id number] - [place], [place] [address] [place] [address] Phone: [phone] Fax: [phone]  [facility] [address] [place] - [address] [place] [place] [address] Phone: [phone] Fax: [phone]    Recent Visits Date Type Provider Dept  02/19/20 Office Visit Vicie Mutters, PA-C [facility]  10/22/19 Office Visit Vicie Mutters, [place]  02/14/19 Office Visit Vicie Mutters, PA-C [facility]  01/11/19 Office Visit Vicie Mutters, PA-C [facility]  10/16/18 Office Visit Vicie Mutters, PA-C [place] recent visits within past 540 days with a meds authorizing provider and meeting all other requirements Future Appointments Date Type Provider Dept  06/22/20 Appointment Vicie Mutters, PA-C [facility]  Showing future appointments within next 150 days with a meds authorizing provider and meeting all other requirements

## 2020-03-27 ENCOUNTER — Encounter (INDEPENDENT_AMBULATORY_CARE_PROVIDER_SITE_OTHER): Payer: Managed Care, Other (non HMO) | Admitting: Ophthalmology

## 2020-04-14 ENCOUNTER — Other Ambulatory Visit: Payer: Self-pay | Admitting: Internal Medicine

## 2020-04-14 DIAGNOSIS — F411 Generalized anxiety disorder: Secondary | ICD-10-CM

## 2020-04-23 ENCOUNTER — Other Ambulatory Visit: Payer: Self-pay | Admitting: Internal Medicine

## 2020-04-23 DIAGNOSIS — J309 Allergic rhinitis, unspecified: Secondary | ICD-10-CM

## 2020-04-24 ENCOUNTER — Other Ambulatory Visit: Payer: Self-pay | Admitting: Internal Medicine

## 2020-04-24 DIAGNOSIS — Z1231 Encounter for screening mammogram for malignant neoplasm of breast: Secondary | ICD-10-CM

## 2020-05-12 ENCOUNTER — Other Ambulatory Visit: Payer: Self-pay

## 2020-05-12 ENCOUNTER — Ambulatory Visit
Admission: RE | Admit: 2020-05-12 | Discharge: 2020-05-12 | Disposition: A | Discharge status: Home / Self Care | Payer: Managed Care, Other (non HMO) | Source: Ambulatory Visit | Attending: Internal Medicine | Admitting: Internal Medicine

## 2020-05-12 DIAGNOSIS — Z1231 Encounter for screening mammogram for malignant neoplasm of breast: Secondary | ICD-10-CM

## 2020-05-14 NOTE — Progress Notes (Addendum)
[facility] Note  05/18/2020     CHIEF COMPLAINT Patient presents for Retina Follow Up   HISTORY OF PRESENT ILLNESS: Amanda Salazar is a 59 y.o. female who presents to the clinic today for:   HPI    Retina Follow Up    Patient presents with  Retinal Break/Detachment.  In left eye.  This started years ago.  Severity is mild.  Duration of 13 months.  Since onset it is stable.  I, the attending physician,  performed the HPI with the patient and updated documentation appropriately.          Comments    59 y/o female pt here for 13 mo f/u for PVD OS and ERM OD.  No change in [place] OU.  Feels VA OD is "poor."  Denies pain, FOL, but has floaters OU.  Restasis BID OU.       Last edited by [name], MD on 05/18/2020  4:04 PM. (History)     pt saw Dr. Zigmund Daniel in May for a new floater, he saw no new tears at the time, pt states vision is stable  Referring physician: [name], MD [address] [place],  [place] [address]  HISTORICAL INFORMATION:   Selected notes from the [place] Referred by Dr. Zigmund Daniel for possible RD LEE- 04.27.18 (JDM) [BCVA OD: 20/20-1 OS: 20/20-1] Ocular Hx-  PMH-     CURRENT MEDICATIONS: Current Outpatient Medications (Ophthalmic Drugs)  Medication Sig  . cycloSPORINE (RESTASIS) 0.05 % ophthalmic emulsion Place 1 drop into both eyes 2 (two) times daily.   No current facility-administered medications for this visit. (Ophthalmic Drugs)   Current Outpatient Medications (Other)  Medication Sig  . ALPRAZolam (XANAX) 0.5 MG tablet 1/2-1 pill as needed daily for anxiety  . amphetamine-dextroamphetamine (ADDERALL) 10 MG tablet dextroamphetamine-amphetamine 10 mg tablet  . cetirizine (EQ ALLERGY RELIEF, CETIRIZINE,) 10 MG tablet Take 1 tablet Daily for Allergies  . Cholecalciferol (VITAMIN D3 MAXIMUM STRENGTH) 5000 UNITS capsule Take 5,000 Units by mouth daily.  . clotrimazole-betamethasone  (LOTRISONE) cream APPLY TO AFFECTED AREA TWICE A DAY  . Cyanocobalamin (VITAMIN B 12 PO) Take by mouth daily.  . cyclobenzaprine (FLEXERIL) 10 MG tablet cyclobenzaprine 10 mg tablet  . escitalopram (LEXAPRO) 10 MG tablet TAKE 1 TABLET BY MOUTH DAILY FOR MOOD  . Hyoscyamine Sulfate SL (LEVSIN/SL) 0.125 MG SUBL Place 0.125 mg under the tongue every 4 (four) hours as needed.  Marland Kitchen lisdexamfetamine (VYVANSE) 30 MG capsule Take 1 capsule (30 mg total) by mouth daily.  . nabumetone (RELAFEN) 500 MG tablet Take 1 tablet 2 x /day with Food for Pain & Inflammation  . nitrofurantoin, macrocrystal-monohydrate, (MACROBID) 100 MG capsule Take 1 capsule (100 mg total) by mouth 2 (two) times daily.  . Omega-3 Fatty Acids (FISH OIL) 1200 MG CAPS Take 1,200 mg by mouth daily.  Marland Kitchen OVER THE COUNTER MEDICATION Turmeric daily  . promethazine (PHENERGAN) 25 MG tablet Take 1 tablet (25 mg total) by mouth every 6 (six) hours as needed for nausea or vomiting (can cause fatigue). Max: 4 tablets per day  . tretinoin (RETIN-A) 0.05 % cream tretinoin 0.05 % topical cream  APPLY TO AFFECTED AREA AT BEDTIME  . triamcinolone (NASACORT) 55 MCG/ACT AERO nasal inhaler SPRAY 2 SPRAYS NASALLY DAILY  . triamcinolone cream (KENALOG) 0.1 %    No current facility-administered medications for this visit. (Other)      REVIEW OF SYSTEMS: ROS  Positive for: Gastrointestinal, Eyes, Psychiatric   Negative for: Constitutional, Neurological, Skin, Genitourinary, Musculoskeletal, HENT, Endocrine, Cardiovascular, Respiratory, Allergic/Imm, Heme/Lymph   Last edited by Matthew Folks, COA on 05/18/2020  3:42 PM. (History)       ALLERGIES Allergies  Allergen Reactions  . Iodinated Diagnostic Agents Anaphylaxis  . Erythromycin Nausea And Vomiting    PAST MEDICAL HISTORY Past Medical History:  Diagnosis Date  . ADD (attention deficit disorder)   . 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 BCC (basal cell  carcinoma of skin) 04/30/2008   Right Neck  . Blurred vision   . Cataract    Mixed form OU  . 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 Seasonal allergies    Past Surgical History:  Procedure Laterality Date  . BREAST EXCISIONAL BIOPSY Bilateral   . BREAST SURGERY     biopsy x 3  . CESAREAN SECTION     x 4  . EYE SURGERY      FAMILY HISTORY Family History  Problem Relation Age of Onset  . Hypertension Mother     SOCIAL HISTORY Social History   Tobacco Use  . Smoking status: Never Smoker  . Smokeless tobacco: Never Used  Vaping Use  . Vaping Use: Never used  Substance Use Topics  . Alcohol use: Yes    Comment: glass wine most nights  . Drug use: No         OPHTHALMIC EXAM:  Base Eye Exam    Visual Acuity (Snellen - Linear)      Right Left   Dist [place] 20/50 - 20/40 -   Dist ph [place] 20/25 - 20/20 -       Tonometry (Tonopen, 4:05 PM)      Right Left   Pressure 16 18       Pupils      Dark Light Shape React APD   Right 3 2 Round Brisk None   Left 3 2 Round Brisk None       Visual Fields (Counting fingers)      Left Right    Full Full       Extraocular Movement      Right Left    Full, Ortho Full, Ortho       Neuro/Psych    Oriented x3: Yes   Mood/Affect: Normal       Dilation    Both eyes: 1.0% Mydriacyl, 2.5% Phenylephrine @ 4:05 PM        Slit Lamp and Fundus Exam    Slit Lamp Exam      Right Left   Lids/Lashes Mild Meibomian gland dysfunction, mild Telangiectasia Mild Meibomian gland dysfunction, mild Telangiectasia   Conjunctiva/Sclera White and quiet White and quiet   Cornea Arcus, 4 RK scars at 1200, 0300, 0600, and 0900, 1+Punctate epithelial erosions, decreased TBUT Arcus, 1+Punctate epithelial erosions, 4 RK scars at 1100, 0200, 0500, 0800   Anterior Chamber Deep and quiet Deep and quiet   Iris Round and dilated Round and dilated   Lens 2-3+ Nuclear sclerosis, 2+ Cortical cataract 2-3+ Nuclear sclerosis, 2-3+ Cortical cataract   Vitreous Mild  Vitreous syneresis, Posterior vitreous detachment, Weiss ring Vitreous syneresis, Posterior vitreous detachment, Weiss ring, vitreous condensations       Fundus Exam      Right Left   Disc Pink and Sharp Pink and Sharp   C/D Ratio 0.3 0.4   Macula Flat, good foveal reflex, Nasal Epiretinal membrane  with pucker and striae nasally - stable from prior, No heme  Flat, Good foveal reflex, mild Retinal pigment epithelial mottling, No heme or edema   Vessels attenuated, mild tortuousity, mild AV crossing changes attenuated, mild tortuousity, mild AV crossing changes   Periphery Attached, small hole with good laser surrounding at 1100, No RT/RD Attached, VR tuft at 1030, small horseshoe tear at 0900 - good laser changes surrounding, No new RT/RD          IMAGING AND PROCEDURES  Imaging and Procedures for 11/17/17  OCT, Retina - OU - Both Eyes       Right Eye Quality was good. Central Foveal Thickness: 361. Progression has been stable. Findings include macular pucker, epiretinal membrane, abnormal foveal contour, no IRF, no SRF (Nasal ERM - stable thickening nasal macula, macular pucker).   Left Eye Quality was good. Central Foveal Thickness: 247. Progression has been stable. Findings include normal foveal contour, no IRF, no SRF.   Notes *Images captured and stored on drive  Diagnosis / Impression:  OD: nasal ERM w/ pucker - stable thickening of retina OS: NFP, No IRF/SRF  Clinical management:  See below  Abbreviations: NFP - Normal foveal profile. CME - cystoid macular edema. PED - pigment epithelial detachment. IRF - intraretinal fluid. SRF - subretinal fluid. EZ - ellipsoid zone. ERM - epiretinal membrane. ORA - outer retinal atrophy. ORT - outer retinal tubulation. SRHM - subretinal hyper-reflective material                  ASSESSMENT/PLAN:    ICD-10-CM   1. Retinal tear of left eye  H33.312   2. Posterior vitreous detachment of both eyes  H43.813   3. History of  repair of retinal tear by laser photocoagulation  Z98.890   4. 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 Epiretinal membrane (ERM) of right eye  H35.371   6. Retinal edema  H35.81 OCT, Retina - OU - Both Eyes  7. Combined forms of age-related cataract of both eyes  H25.813     1,2. Acute PVD with retinal tear, OS    - small horseshoe tear located at 0900 -- no SRF  - S/P laser retinopexy OS (03.11.19) - good laser changes  - no new tears or detachment on repeat 360 peripheral exam  - f/u 1 year  3. History of retinal tear OD  - 1100 oclock position  - s/p laser retinopexy with Dr. Zigmund Daniel  - stable  - continue to monitor  4,5. Epiretinal membrane, OD  - BCVA remains good and stable 20/25-1  - OCT shows stable nasal ERM with mild pucker and retinal thickening  - no indication for surgery at this time  - continue to monitor  - f/u 1 year, sooner prn  6. No retinal edema on exam or OCT  7. Combined form age-related cataract OU-   - The symptoms of cataract, surgical options, and treatments and risks were discussed with patient.  - discussed diagnosis and progression  - not yet visually significant  - continue to monitor for now   Ophthalmic Meds Ordered this visit:  No orders of the defined types were placed in this encounter.      Return in about 1 year (around 05/18/2021) for f/u ERM OD, DFE, OCT.  There are no Patient Instructions on file for this visit.  This document serves as a record of services personally performed by Gardiner Sleeper, MD, PhD. It was created  on their behalf by Leeann Must, [place], an ophthalmic technician. The creation of this record is the provider's dictation and/or activities during the visit.    Electronically signed by: Leeann Must, [place] 10.07.2021 4:32 PM   This document serves as a record of services personally performed by Gardiner Sleeper, MD, PhD. It was created on their behalf by San Jetty. Owens Shark, OA an ophthalmic technician.  The creation of this record is the provider's dictation and/or activities during the visit.    Electronically signed by: San Jetty. Owens Shark, [place] 10.11.2021 4:32 PM  Gardiner Sleeper, M.D., Ph.D. Diseases & Surgery of the Retina and [place] 05/18/2020   I have reviewed the above documentation for accuracy and completeness, and I agree with the above. Gardiner Sleeper, M.D., Ph.D. 05/18/20 4:32 PM   Abbreviations: M myopia (nearsighted); A astigmatism; H hyperopia (farsighted); P presbyopia; Mrx spectacle prescription;  CTL contact lenses; OD right eye; OS left eye; OU both eyes  XT exotropia; ET esotropia; PEK punctate epithelial keratitis; PEE punctate epithelial erosions; DES dry eye syndrome; MGD meibomian gland dysfunction; ATs artificial tears; PFAT's preservative free artificial tears; [place] nuclear sclerotic cataract; PSC posterior subcapsular cataract; ERM epi-retinal membrane; PVD posterior vitreous detachment; RD retinal detachment; DM diabetes mellitus; DR diabetic retinopathy; NPDR non-proliferative diabetic retinopathy; PDR proliferative diabetic retinopathy; CSME clinically significant macular edema; DME diabetic macular edema; dbh dot blot hemorrhages; CWS cotton wool spot; POAG primary open angle glaucoma; C/D cup-to-disc ratio; HVF humphrey visual field; GVF goldmann visual field; OCT optical coherence tomography; IOP intraocular pressure; BRVO Branch retinal vein occlusion; CRVO central retinal vein occlusion; CRAO central retinal artery occlusion; BRAO branch retinal artery occlusion; RT retinal tear; SB scleral buckle; PPV pars plana vitrectomy; VH Vitreous hemorrhage; PRP panretinal laser photocoagulation; IVK intravitreal kenalog; VMT vitreomacular traction; MH Macular hole;  NVD neovascularization of the disc; NVE neovascularization elsewhere; AREDS age related eye disease study; ARMD age related macular degeneration; POAG primary open angle glaucoma;  EBMD epithelial/anterior basement membrane dystrophy; ACIOL anterior chamber intraocular lens; IOL intraocular lens; PCIOL posterior chamber intraocular lens; Phaco/IOL phacoemulsification with intraocular lens placement; Sellers photorefractive keratectomy; LASIK laser assisted in situ keratomileusis; HTN hypertension; DM diabetes mellitus; COPD chronic obstructive pulmonary disease

## 2020-05-18 ENCOUNTER — Ambulatory Visit (INDEPENDENT_AMBULATORY_CARE_PROVIDER_SITE_OTHER): Payer: Managed Care, Other (non HMO) | Admitting: Ophthalmology

## 2020-05-18 ENCOUNTER — Encounter (INDEPENDENT_AMBULATORY_CARE_PROVIDER_SITE_OTHER): Payer: Self-pay | Admitting: Ophthalmology

## 2020-05-18 ENCOUNTER — Other Ambulatory Visit: Payer: Self-pay

## 2020-05-18 DIAGNOSIS — H3581 Retinal edema: Secondary | ICD-10-CM

## 2020-05-18 DIAGNOSIS — Z9889 Other specified postprocedural states: Secondary | ICD-10-CM

## 2020-05-18 DIAGNOSIS — H25813 Combined forms of age-related cataract, bilateral: Secondary | ICD-10-CM

## 2020-05-18 DIAGNOSIS — H43813 Vitreous degeneration, bilateral: Secondary | ICD-10-CM | POA: Diagnosis not present

## 2020-05-18 DIAGNOSIS — H33312 Horseshoe tear of retina without detachment, left eye: Secondary | ICD-10-CM

## 2020-05-18 DIAGNOSIS — H35341 Macular cyst, hole, or pseudohole, right eye: Secondary | ICD-10-CM

## 2020-05-18 DIAGNOSIS — H35371 Puckering of macula, right eye: Secondary | ICD-10-CM

## 2020-06-05 ENCOUNTER — Other Ambulatory Visit: Payer: Self-pay | Admitting: Internal Medicine

## 2020-06-05 DIAGNOSIS — F988 Other specified behavioral and emotional disorders with onset usually occurring in childhood and adolescence: Secondary | ICD-10-CM

## 2020-06-05 MED ORDER — LISDEXAMFETAMINE DIMESYLATE 30 MG PO CAPS: ORAL_CAPSULE | ORAL | 0 refills | Status: DC

## 2020-06-08 ENCOUNTER — Other Ambulatory Visit: Payer: Self-pay | Admitting: Adult Health Nurse Practitioner

## 2020-06-08 ENCOUNTER — Telehealth: Payer: Self-pay

## 2020-06-08 DIAGNOSIS — E785 Hyperlipidemia, unspecified: Secondary | ICD-10-CM

## 2020-06-08 NOTE — Telephone Encounter (Signed)
-----   Message from Garnet Sierras, NP sent at 06/08/2020  8:31 AM EDT ----- Regarding: Cardiac calcium score Sent referral for cardiac calcium score.  [facility] please.   Let patient know we have placed the order.  She needs to keep her appointment on the 15th.   If test is not complete prior to this we can move so we can review in person.    Sincerely,           Garnet Sierras, NP

## 2020-06-08 NOTE — Telephone Encounter (Signed)
Spoke with pt

## 2020-06-22 ENCOUNTER — Ambulatory Visit (INDEPENDENT_AMBULATORY_CARE_PROVIDER_SITE_OTHER): Payer: Managed Care, Other (non HMO) | Admitting: Adult Health Nurse Practitioner

## 2020-06-22 ENCOUNTER — Encounter: Payer: Self-pay | Admitting: Adult Health Nurse Practitioner

## 2020-06-22 ENCOUNTER — Other Ambulatory Visit: Payer: Self-pay

## 2020-06-22 VITALS — BP 120/80 | HR 72 | Temp 97.6°F | Ht 62.0 in | Wt 128.0 lb

## 2020-06-22 DIAGNOSIS — L659 Nonscarring hair loss, unspecified: Secondary | ICD-10-CM | POA: Diagnosis not present

## 2020-06-22 DIAGNOSIS — F411 Generalized anxiety disorder: Secondary | ICD-10-CM

## 2020-06-22 DIAGNOSIS — K219 Gastro-esophageal reflux disease without esophagitis: Secondary | ICD-10-CM

## 2020-06-22 DIAGNOSIS — Z79899 Other long term (current) drug therapy: Secondary | ICD-10-CM

## 2020-06-22 DIAGNOSIS — E785 Hyperlipidemia, unspecified: Secondary | ICD-10-CM | POA: Diagnosis not present

## 2020-06-22 DIAGNOSIS — N390 Urinary tract infection, site not specified: Secondary | ICD-10-CM

## 2020-06-22 DIAGNOSIS — F988 Other specified behavioral and emotional disorders with onset usually occurring in childhood and adolescence: Secondary | ICD-10-CM

## 2020-06-22 DIAGNOSIS — E559 Vitamin D deficiency, unspecified: Secondary | ICD-10-CM

## 2020-06-22 NOTE — Patient Instructions (Addendum)
     Try contacting a Trichologist, they specialize in hair and scalp related problems.   We will do the Coronary Calcium Score to assess your cholesterol and the need for medication.   We will contact you via MyChart with your lab results.   Monitor the spot, pubic area.  You can schedule an appointment to have cryo therapy on this.     GENERAL HEALTH GOALS  Know what a healthy weight is for you (roughly BMI <25) and aim to maintain this  Aim for 7+ servings of fruits and vegetables daily  70-80+ fluid ounces of water or unsweet tea for healthy kidneys  Limit to max 1 drink of alcohol per day; avoid smoking/tobacco  Limit animal fats in diet for cholesterol and heart health - choose grass fed whenever available  Avoid highly processed foods, and foods high in saturated/trans fats  Aim for low stress - take time to unwind and care for your mental health  Aim for 150 min of moderate intensity exercise weekly for heart health, and weights twice weekly for bone health  Aim for 7-9 hours of sleep daily

## 2020-06-22 NOTE — Progress Notes (Signed)
Assessment and Plan:  Hyperlipidemia, unspecified hyperlipidemia type Trend elevated despite weight control and diet. Consider Coronary Calcium score for this Likely familial, need statin? Discussed dietary and exercise modifications Low fat diet -     Lipid panel  Attention deficit disorder, unspecified hyperactivity presence Doing well at this time -     TSH -     lisdexamfetamine (VYVANSE) 30 MG capsule; Take 1 capsule (30 mg total) by mouth daily.  Generalized anxiety disorder Doing well on current regiment Continue escitalopram 10mg  daily, uses alprazolam 0.5mg  prn Discussed stress management techniques   Discussed good sleep hygiene Discussed increasing physical activity and exercise Increase water intake -     TSH  Gastroesophageal reflux disease, unspecified whether esophagitis present Doing well at this time Continue: OTC prn Diet discussed Monitor for triggers Avoid food with high acid content Avoid excessive cafeine Increase water intake  Vitamin D deficiency Continue supplementation to maintain goal of 70-100 Taking Vitamin D 5,000 IU daily Defer vitamin D level -     VITAMIN D 25 Hydroxy (Vit-D Deficiency, Fractures)  Recurrent UTI -     Urinalysis, Routine w reflex microscopic -     Urine Culture -     nitrofurantoin, macrocrystal-monohydrate, (MACROBID) 100 MG capsule; Take 1 capsule (100 mg total) by mouth 2 (two) times daily.  Hair loss ? telogen effluvium from stress will check labs- if not better will refer to [facility]   Medication management Continued -     CBC with Differential/Platelet -     COMPLETE METABOLIC PANEL WITH GFR -     Magnesium   Further disposition pending results if labs check today. Discussed med's effects and SE's.   Over 30 minutes of face to face interview, exam, counseling, chart review, and critical decision making was performed.   Future Appointments  Date Time Provider [place]  09/07/2020  3:45 PM Lavonna Monarch, MD [facility] [place]  10/21/2020  9:00 AM Garnet Sierras, NP [facility] None  05/18/2021  3:00 PM [name], MD [facility] None    HPI 59 y.o. female  presents for follow up.    She is working from home, she is working all the time.  Husband had brain aneurysm and had surgery recently coiling, had small stroke in May.   She has had thinning hair over one year, she did get extension for a few years. States mainly around the crown and top of her head.  No hair thinning in her family. She is on biotin supplementation.  She denies any bald spots.  Reports some shedding of hair with brushing or washing but not excessive.  Has had feet/hand pain, has had negative work up for autoimmune and is on tumeric that is helping. She has been having left sided costochondritis and some left shoulder/neck pain- worse with posture at work- can cause flare, never with exertion. Tumeric and NSAIDS help.     BMI is Body mass index is 23.41 kg/m., she is working on diet and exercise. Wt Readings from Last 3 Encounters:  06/22/20 128 lb (58.1 kg)  02/19/20 127 lb (57.6 kg)  10/22/19 131 lb (59.4 kg)    Her blood pressure has been controlled at home, today their BP is BP: 120/80 She does not workout. She denies chest pain, shortness of breath, dizziness.  She is not on cholesterol medication and denies myalgias. Her cholesterol is at goal. The cholesterol last visit was:   Lab Results  Component Value Date   CHOL  257 (H) 02/19/2020   HDL 53 02/19/2020   LDLCALC 167 (H) 02/19/2020   LDLDIRECT 151.2 01/03/2008   TRIG 209 (H) 02/19/2020   CHOLHDL 4.8 02/19/2020   Last A1C in the office was:  Lab Results  Component Value Date   HGBA1C 5.0 10/22/2019   Patient is on Vitamin D supplement.  Lab Results  Component Value Date   VD25OH 40 02/19/2020   Works at [employer], increased stress/time but has improved. 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  Rarely takes adderall at work.   Lab Results  Component Value Date   TSH 1.24 02/19/2020    Current Medications:     Current Outpatient Medications (Respiratory):  .  cetirizine (EQ ALLERGY RELIEF, CETIRIZINE,) 10 MG tablet, Take 1 tablet Daily for Allergies .  triamcinolone (NASACORT) 55 MCG/ACT AERO nasal inhaler, SPRAY 2 SPRAYS NASALLY DAILY  Current Outpatient Medications (Analgesics):  .  nabumetone (RELAFEN) 500 MG tablet, Take 1 tablet 2 x /day with Food for Pain & Inflammation  Current Outpatient Medications (Hematological):  Marland Kitchen  Cyanocobalamin (VITAMIN B 12 PO), Take by mouth daily.  Current Outpatient Medications (Other):  Marland Kitchen  ALPRAZolam (XANAX) 0.5 MG tablet, 1/2-1 pill as needed daily for anxiety .  Cholecalciferol (VITAMIN D3 MAXIMUM STRENGTH) 5000 UNITS capsule, Take 5,000 Units by mouth daily. .  cycloSPORINE (RESTASIS) 0.05 % ophthalmic emulsion, Place 1 drop into both eyes 2 (two) times daily. Marland Kitchen  escitalopram (LEXAPRO) 10 MG tablet, TAKE 1 TABLET BY MOUTH DAILY FOR MOOD .  Hyoscyamine Sulfate SL (LEVSIN/SL) 0.125 MG SUBL, Place 0.125 mg under the tongue every 4 (four) hours as needed. Marland Kitchen  lisdexamfetamine (VYVANSE) 30 MG capsule, Take      1 capsule       Daily       for Alertness, Focus & Concentration .  Omega-3 Fatty Acids (FISH OIL) 1200 MG CAPS, Take 1,200 mg by mouth daily. Marland Kitchen  OVER THE COUNTER MEDICATION, Turmeric daily .  tretinoin (RETIN-A) 0.05 % cream, tretinoin 0.05 % topical cream  APPLY TO AFFECTED AREA AT BEDTIME .  triamcinolone cream (KENALOG) 0.1 %,    Medical History:  Past Medical History:  Diagnosis Date  . ADD (attention deficit disorder)   . 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 BCC (basal cell carcinoma of skin) 04/30/2008   Right Neck  . Blurred vision   . Cataract    Mixed form OU  . 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 Seasonal allergies    Allergies Allergies  Allergen Reactions  .  Iodinated Diagnostic Agents Anaphylaxis  . Erythromycin Nausea And Vomiting   Surgical History: reviewed and unchanged Family History: reviewed and unchanged Social History: reviewed and unchanged  Review of Systems  Constitutional: Negative.   HENT: Negative.   Eyes: Negative.   Respiratory: Negative.   Cardiovascular: Negative.   Gastrointestinal: Negative.   Genitourinary: Negative.   Musculoskeletal: Negative.   Skin: Negative.   Neurological: Negative.  Negative for dizziness, tingling, tremors, sensory change, speech change, focal weakness, seizures and loss of consciousness.  Endo/Heme/Allergies: Negative.   Psychiatric/Behavioral: Negative for depression, hallucinations, memory loss, substance abuse and suicidal ideas. The patient is nervous/anxious. The patient does not have insomnia.      Physical Exam: Estimated body mass index is 23.41 kg/m as calculated from the following:   Height as of this encounter: 5\' 2"  (  1.575 m).   Weight as of this encounter: 128 lb (58.1 kg). BP 120/80   Pulse 72   Temp 97.6 F (36.4 C)   Ht 5\' 2"  (1.575 m)   Wt 128 lb (58.1 kg)   LMP 05/11/2012   SpO2 96%   BMI 23.41 kg/m  General Appearance: Well nourished, in no apparent distress. Eyes: PERRLA, EOMs, conjunctiva no swelling or erythema, normal fundi and vessels. Sinuses: No Frontal/maxillary tenderness ENT/Mouth: Ext aud canals clear, normal light reflex with TMs without erythema, bulging.  Good dentition. No erythema, swelling, or exudate on post pharynx. Tonsils not swollen or erythematous. Hearing normal.  Neck: Supple, thyroid normal. No bruits Respiratory: Respiratory effort normal, BS equal bilaterally without rales, rhonchi, wheezing or stridor. Cardio: RRR without murmurs, rubs or gallops. Brisk peripheral pulses without edema.  Chest: symmetric, with normal excursions and percussion. Abdomen: Soft, +BS. Non tender, no guarding, rebound, hernias, masses, or organomegaly.  .  Lymphatics: Non tender without lymphadenopathy.  Musculoskeletal: Full ROM all peripheral extremities,5/5 strength, and normal gait. Skin: Warm, dry without rashes, lesions, ecchymosis. Hair, even distribution, thin. Neuro: Cranial nerves intact, reflexes equal bilaterally. Normal muscle tone, no cerebellar symptoms. Sensation intact.  Psych: Awake and oriented X 3, normal affect, Insight and Judgment appropriate.   Future Appointments  Date Time Provider [place]  09/07/2020  3:45 PM Lavonna Monarch, MD [facility] [place]  10/21/2020  9:00 AM Garnet Sierras, NP [facility] None  05/18/2021  3:00 PM [name], MD [facility] None     Garnet Sierras 4:53 PM

## 2020-06-23 LAB — CBC WITH DIFFERENTIAL/PLATELET
Absolute Monocytes: 469 cells/uL (ref 200–950)
Basophils Absolute: 20 cells/uL (ref 0–200)
Basophils Relative: 0.3 %
Eosinophils Absolute: 127 cells/uL (ref 15–500)
Eosinophils Relative: 1.9 %
HCT: 41.5 % (ref 35.0–45.0)
Hemoglobin: 14.4 g/dL (ref 11.7–15.5)
Lymphs Abs: 2332 cells/uL (ref 850–3900)
MCH: 31.8 pg (ref 27.0–33.0)
MCHC: 34.7 g/dL (ref 32.0–36.0)
MCV: 91.6 fL (ref 80.0–100.0)
MPV: 11 fL (ref 7.5–12.5)
Monocytes Relative: 7 %
Neutro Abs: 3752 cells/uL (ref 1500–7800)
Neutrophils Relative %: 56 %
Platelets: 294 10*3/uL (ref 140–400)
RBC: 4.53 10*6/uL (ref 3.80–5.10)
RDW: 12.1 % (ref 11.0–15.0)
Total Lymphocyte: 34.8 %
WBC: 6.7 10*3/uL (ref 3.8–10.8)

## 2020-06-23 LAB — LIPID PANEL
Cholesterol: 226 mg/dL — ABNORMAL HIGH (ref <200)
HDL: 63 mg/dL (ref >=50)
LDL Cholesterol (Calc): 144 mg/dL (calc) — ABNORMAL HIGH
Non-HDL Cholesterol (Calc): 163 mg/dL (calc) — ABNORMAL HIGH (ref <130)
Total CHOL/HDL Ratio: 3.6 (calc) (ref <5.0)
Triglycerides: 89 mg/dL (ref <150)

## 2020-06-23 LAB — COMPLETE METABOLIC PANEL WITH GFR
AG Ratio: 2.1 (calc) (ref 1.0–2.5)
ALT: 12 U/L (ref 6–29)
AST: 19 U/L (ref 10–35)
Albumin: 4.4 g/dL (ref 3.6–5.1)
Alkaline phosphatase (APISO): 99 U/L (ref 37–153)
BUN: 8 mg/dL (ref 7–25)
CO2: 27 mmol/L (ref 20–32)
Calcium: 9.3 mg/dL (ref 8.6–10.4)
Chloride: 107 mmol/L (ref 98–110)
Creat: 0.63 mg/dL (ref 0.50–0.99)
GFR, Est African American: 113 mL/min/{1.73_m2} (ref >=60)
GFR, Est Non African American: 97 mL/min/{1.73_m2} (ref >=60)
Globulin: 2.1 g/dL (calc) (ref 1.9–3.7)
Glucose, Bld: 84 mg/dL (ref 65–99)
Potassium: 4 mmol/L (ref 3.5–5.3)
Sodium: 140 mmol/L (ref 135–146)
Total Bilirubin: 0.4 mg/dL (ref 0.2–1.2)
Total Protein: 6.5 g/dL (ref 6.1–8.1)

## 2020-06-23 LAB — TSH: TSH: 1.89 mIU/L (ref 0.40–4.50)

## 2020-07-15 ENCOUNTER — Other Ambulatory Visit: Payer: Self-pay

## 2020-07-15 ENCOUNTER — Ambulatory Visit (INDEPENDENT_AMBULATORY_CARE_PROVIDER_SITE_OTHER): Payer: Managed Care, Other (non HMO) | Admitting: Ophthalmology

## 2020-07-15 DIAGNOSIS — H25813 Combined forms of age-related cataract, bilateral: Secondary | ICD-10-CM

## 2020-07-15 DIAGNOSIS — Z9889 Other specified postprocedural states: Secondary | ICD-10-CM

## 2020-07-15 DIAGNOSIS — H3581 Retinal edema: Secondary | ICD-10-CM

## 2020-07-15 DIAGNOSIS — H43813 Vitreous degeneration, bilateral: Secondary | ICD-10-CM | POA: Diagnosis not present

## 2020-07-15 DIAGNOSIS — H33312 Horseshoe tear of retina without detachment, left eye: Secondary | ICD-10-CM

## 2020-07-15 DIAGNOSIS — H35371 Puckering of macula, right eye: Secondary | ICD-10-CM

## 2020-07-15 DIAGNOSIS — H35341 Macular cyst, hole, or pseudohole, right eye: Secondary | ICD-10-CM | POA: Diagnosis not present

## 2020-07-15 NOTE — Progress Notes (Addendum)
[facility] Note  07/15/2020     CHIEF COMPLAINT Patient presents for Eye Problem   HISTORY OF PRESENT ILLNESS: Amanda Salazar is a 59 y.o. female who presents to the clinic today for:   HPI    Around Thanksgiving she started seeing more floaters, she believes OU but unsure.  Worse when working on the computer.  No FOL's.  Last week she went to Dr. Lucita Ferrara.  He told her he could see the floaters but was unsure if it was a tear.  Seems like the floaters are even worse since then.   Dr. Lucita Ferrara told her it appeared her retina was thickening.  He gave her Prolense qd OD and a nasal spray to try Tyrvaya.  Using Restasis BID OU.    Last edited by Leonie Douglas, [place] on 07/15/2020  8:55 AM. (History)    pt states around Thanksgiving she started seeing new floaters that seem worse when reading or looking at the computer, pt states she saw Dr. Lucita Ferrara for her routine exam and he told her that he could see the floaters and thought her retina was "thickening" Dr. Lucita Ferrara gave her Prolensa to help with inflammation and a nasal spray that is supposed to help produce more tears, pt states since she saw Dr. Lucita Ferrara she feels like the floaters have gotten worse    Referring physician: [name], MD [place],  [place] [address]  HISTORICAL INFORMATION:   Selected notes from the MEDICAL RECORD NUMBER Referred by Dr. Zigmund Daniel for possible RD LEE- 04.27.18 (JDM) [BCVA OD: 20/20-1 OS: 20/20-1] Ocular Hx-  PMH-     CURRENT MEDICATIONS: Current Outpatient Medications (Ophthalmic Drugs)  Medication Sig  . cycloSPORINE (RESTASIS) 0.05 % ophthalmic emulsion Place 1 drop into both eyes 2 (two) times daily.   No current facility-administered medications for this visit. (Ophthalmic Drugs)   Current Outpatient Medications (Other)  Medication Sig  . ALPRAZolam (XANAX) 0.5 MG tablet 1/2-1 pill as needed daily for anxiety   . cetirizine (EQ ALLERGY RELIEF, CETIRIZINE,) 10 MG tablet Take 1 tablet Daily for Allergies  . Cholecalciferol (VITAMIN D3 MAXIMUM STRENGTH) 5000 UNITS capsule Take 5,000 Units by mouth daily.  . Cyanocobalamin (VITAMIN B 12 PO) Take by mouth daily.  Marland Kitchen Hyoscyamine Sulfate SL (LEVSIN/SL) 0.125 MG SUBL Place 0.125 mg under the tongue every 4 (four) hours as needed.  Marland Kitchen lisdexamfetamine (VYVANSE) 30 MG capsule Take      1 capsule       Daily       for Alertness, Focus & Concentration  . nabumetone (RELAFEN) 500 MG tablet Take 1 tablet 2 x /day with Food for Pain & Inflammation  . Omega-3 Fatty Acids (FISH OIL) 1200 MG CAPS Take 1,200 mg by mouth daily.  Marland Kitchen OVER THE COUNTER MEDICATION Turmeric daily  . tretinoin (RETIN-A) 0.05 % cream tretinoin 0.05 % topical cream  APPLY TO AFFECTED AREA AT BEDTIME  . triamcinolone (NASACORT) 55 MCG/ACT AERO nasal inhaler SPRAY 2 SPRAYS NASALLY DAILY  . triamcinolone cream (KENALOG) 0.1 %   . escitalopram (LEXAPRO) 10 MG tablet TAKE 1 TABLET BY MOUTH DAILY FOR MOOD (Patient not taking: Reported on 07/15/2020)   No current facility-administered medications for this visit. (Other)      REVIEW OF SYSTEMS: ROS    Positive for: Gastrointestinal, Eyes, Psychiatric, Heme/Lymph   Negative for: Constitutional, Neurological, Skin, Genitourinary, Musculoskeletal, HENT, Endocrine, Cardiovascular, Respiratory, Allergic/Imm   Last edited by  Leonie Douglas, COA on 07/15/2020  8:55 AM. (History)       ALLERGIES Allergies  Allergen Reactions  . Iodinated Diagnostic Agents Anaphylaxis  . Erythromycin Nausea And Vomiting    PAST MEDICAL HISTORY Past Medical History:  Diagnosis Date  . ADD (attention deficit disorder)   . 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 BCC (basal cell carcinoma of skin) 04/30/2008   Right Neck  . Blurred vision   . Cataract    Mixed form OU  . 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 Seasonal allergies    Past Surgical  History:  Procedure Laterality Date  . BREAST EXCISIONAL BIOPSY Bilateral   . BREAST SURGERY     biopsy x 3  . CESAREAN SECTION     x 4  . EYE SURGERY      FAMILY HISTORY Family History  Problem Relation Age of Onset  . Hypertension Mother     SOCIAL HISTORY Social History   Tobacco Use  . Smoking status: Never Smoker  . Smokeless tobacco: Never Used  Vaping Use  . Vaping Use: Never used  Substance Use Topics  . Alcohol use: Yes    Comment: glass wine most nights  . Drug use: No         OPHTHALMIC EXAM:  Base Eye Exam    Visual Acuity (Snellen - Linear)      Right Left   Dist [place] 20/30 -2 20/25   Dist ph [place] 20/25 20/20       Tonometry (Tonopen, 9:03 AM)      Right Left   Pressure 15 13       Pupils      Dark Light Shape React APD   Right 3 2 Round Brisk None   Left 3 2 Round Brisk None       Visual Fields (Counting fingers)      Left Right    Full Full       Extraocular Movement      Right Left    Full Full       Neuro/Psych    Oriented x3: Yes   Mood/Affect: Normal       Dilation    Both eyes: 1.0% Mydriacyl, 2.5% Phenylephrine @ 9:03 AM        Slit Lamp and Fundus Exam    Slit Lamp Exam      Right Left   Lids/Lashes Mild Meibomian gland dysfunction, mild Telangiectasia Mild Meibomian gland dysfunction, mild Telangiectasia   Conjunctiva/Sclera White and quiet White and quiet   Cornea Arcus, 4 RK scars at 1200, 0300, 0600, and 0900, 2+Punctate epithelial erosions, decreased TBUT Arcus, 1+Punctate epithelial erosions, 4 RK scars at 1100, 0200, 0500, 0800   Anterior Chamber Deep and quiet Deep and quiet   Iris Round and dilated Round and dilated   Lens 2-3+ Nuclear sclerosis, 2-3+ Cortical cataract 2-3+ Nuclear sclerosis, 2-3+ Cortical cataract   Vitreous Mild Vitreous syneresis, Posterior vitreous detachment, Weiss ring, no pigment, vitreous condensations Vitreous syneresis, Posterior vitreous detachment, Weiss ring, vitreous  condensations       Fundus Exam      Right Left   Disc Pink and Sharp Pink and Sharp, Compact   C/D Ratio 0.3 0.4   Macula Flat, blunted foveal reflex, Nasal Epiretinal membrane with pucker and striae nasally - stable from prior, No heme  Flat, Good foveal reflex, mild Retinal pigment epithelial mottling, No heme or edema  Vessels mild attenuation, mild tortuousity attenuated, mild tortuousity, tortuousity   Periphery Attached, small hole with good laser surrounding at 1100, No RT/RD Attached, small horseshoe tear at 0900 - good laser changes surrounding, No new RT/RD          IMAGING AND PROCEDURES  Imaging and Procedures for 11/17/17  OCT, Retina - OU - Both Eyes       Right Eye Quality was good. Central Foveal Thickness: 351. Progression has been stable. Findings include macular pucker, epiretinal membrane, abnormal foveal contour, no IRF, no SRF (Nasal ERM - stable thickening, macular pucker, trace cystic changes).   Left Eye Quality was good. Central Foveal Thickness: 249. Progression has been stable. Findings include normal foveal contour, no IRF, no SRF.   Notes *Images captured and stored on drive  Diagnosis / Impression:  OD: Nasal ERM - stable thickening, macular pucker, trace cystic changes OS: NFP, No IRF/SRF  Clinical management:  See below  Abbreviations: NFP - Normal foveal profile. CME - cystoid macular edema. PED - pigment epithelial detachment. IRF - intraretinal fluid. SRF - subretinal fluid. EZ - ellipsoid zone. ERM - epiretinal membrane. ORA - outer retinal atrophy. ORT - outer retinal tubulation. SRHM - subretinal hyper-reflective material                  ASSESSMENT/PLAN:    ICD-10-CM   1. Retinal tear of left eye  H33.312   2. Posterior vitreous detachment of both eyes  H43.813   3. History of repair of retinal tear by laser photocoagulation  Z98.890   4. 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 Epiretinal membrane (ERM) of  right eye  H35.371   6. Retinal edema  H35.81 OCT, Retina - OU - Both Eyes  7. Combined forms of age-related cataract of both eyes  H25.813     **pt presents acutely today for new floaters OU since Thanksgiving**  - no significant changes from exam on 10.11.21 -- stable PVD and vitreous condensations  - no RT/RD on exam  - reassured patient of stable findings  - monitor  1,2. Acute PVD with retinal tear, OS    - small horseshoe tear located at 0900 -- no SRF  - S/P laser retinopexy OS (03.11.19) - good laser changes  - no new tears or detachment on repeat 360 peripheral exam  - f/u 1 year  3. History of retinal tear OD  - 1100 oclock position  - s/p laser retinopexy with Dr. Zigmund Daniel  - stable  - continue to monitor  4,5. Epiretinal membrane, OD  - BCVA remains good and stable 20/25-1  - OCT shows stable nasal ERM with mild pucker and retinal thickening  - no indication for surgery at this time  - continue to monitor  - f/u 1 year, sooner prn  6. No retinal edema on exam or OCT  7. Combined form age-related cataract OU-   - The symptoms of cataract, surgical options, and treatments and risks were discussed with patient.  - discussed diagnosis and progression  - not yet visually significant  - continue to monitor for now   Ophthalmic Meds Ordered this visit:  No orders of the defined types were placed in this encounter.      Return for f/u as scheduled in October 2022.  There are no Patient Instructions on file for this visit.  This document serves as a record of services personally performed by Gardiner Sleeper, MD, PhD. It  was created on their behalf by San Jetty. Owens Shark, OA an ophthalmic technician. The creation of this record is the provider's dictation and/or activities during the visit.    Electronically signed by: San Jetty. Marguerita Merles 12.08.2021 10:52 PM  Gardiner Sleeper, M.D., Ph.D. Diseases & Surgery of the Retina and Vitreous [facility]  I have reviewed the above documentation for accuracy and completeness, and I agree with the above. Gardiner Sleeper, M.D., Ph.D. 07/16/20 10:52 PM   Abbreviations: M myopia (nearsighted); A astigmatism; H hyperopia (farsighted); P presbyopia; Mrx spectacle prescription;  CTL contact lenses; OD right eye; OS left eye; OU both eyes  XT exotropia; ET esotropia; PEK punctate epithelial keratitis; PEE punctate epithelial erosions; DES dry eye syndrome; MGD meibomian gland dysfunction; ATs artificial tears; PFAT's preservative free artificial tears; [place] nuclear sclerotic cataract; PSC posterior subcapsular cataract; ERM epi-retinal membrane; PVD posterior vitreous detachment; RD retinal detachment; DM diabetes mellitus; DR diabetic retinopathy; NPDR non-proliferative diabetic retinopathy; PDR proliferative diabetic retinopathy; CSME clinically significant macular edema; DME diabetic macular edema; dbh dot blot hemorrhages; CWS cotton wool spot; POAG primary open angle glaucoma; C/D cup-to-disc ratio; HVF humphrey visual field; GVF goldmann visual field; OCT optical coherence tomography; IOP intraocular pressure; BRVO Branch retinal vein occlusion; CRVO central retinal vein occlusion; CRAO central retinal artery occlusion; BRAO branch retinal artery occlusion; RT retinal tear; SB scleral buckle; PPV pars plana vitrectomy; VH Vitreous hemorrhage; PRP panretinal laser photocoagulation; IVK intravitreal kenalog; VMT vitreomacular traction; MH Macular hole;  NVD neovascularization of the disc; NVE neovascularization elsewhere; AREDS age related eye disease study; ARMD age related macular degeneration; POAG primary open angle glaucoma; EBMD epithelial/anterior basement membrane dystrophy; ACIOL anterior chamber intraocular lens; IOL intraocular lens; PCIOL posterior chamber intraocular lens; Phaco/IOL phacoemulsification with intraocular lens placement; [place] photorefractive keratectomy; LASIK laser assisted in situ  keratomileusis; HTN hypertension; DM diabetes mellitus; COPD chronic obstructive pulmonary disease

## 2020-07-16 ENCOUNTER — Encounter (INDEPENDENT_AMBULATORY_CARE_PROVIDER_SITE_OTHER): Payer: Self-pay | Admitting: Ophthalmology

## 2020-08-05 ENCOUNTER — Other Ambulatory Visit: Payer: Self-pay

## 2020-08-05 DIAGNOSIS — F988 Other specified behavioral and emotional disorders with onset usually occurring in childhood and adolescence: Secondary | ICD-10-CM

## 2020-08-06 MED ORDER — LISDEXAMFETAMINE DIMESYLATE 30 MG PO CAPS: ORAL_CAPSULE | ORAL | 0 refills | Status: DC

## 2020-08-14 ENCOUNTER — Encounter: Payer: Self-pay | Admitting: Internal Medicine

## 2020-09-07 ENCOUNTER — Ambulatory Visit (INDEPENDENT_AMBULATORY_CARE_PROVIDER_SITE_OTHER): Payer: Managed Care, Other (non HMO) | Admitting: Dermatology

## 2020-09-07 ENCOUNTER — Encounter: Payer: Self-pay | Admitting: Dermatology

## 2020-09-07 ENCOUNTER — Other Ambulatory Visit: Payer: Self-pay

## 2020-09-07 DIAGNOSIS — L570 Actinic keratosis: Secondary | ICD-10-CM

## 2020-09-07 DIAGNOSIS — Z1283 Encounter for screening for malignant neoplasm of skin: Secondary | ICD-10-CM

## 2020-09-07 DIAGNOSIS — D1801 Hemangioma of skin and subcutaneous tissue: Secondary | ICD-10-CM | POA: Diagnosis not present

## 2020-09-07 DIAGNOSIS — D485 Neoplasm of uncertain behavior of skin: Secondary | ICD-10-CM

## 2020-09-07 DIAGNOSIS — C4491 Basal cell carcinoma of skin, unspecified: Secondary | ICD-10-CM

## 2020-09-07 DIAGNOSIS — C4441 Basal cell carcinoma of skin of scalp and neck: Secondary | ICD-10-CM

## 2020-09-07 DIAGNOSIS — D2372 Other benign neoplasm of skin of left lower limb, including hip: Secondary | ICD-10-CM

## 2020-09-07 DIAGNOSIS — L821 Other seborrheic keratosis: Secondary | ICD-10-CM

## 2020-09-07 DIAGNOSIS — R238 Other skin changes: Secondary | ICD-10-CM

## 2020-09-07 DIAGNOSIS — D239 Other benign neoplasm of skin, unspecified: Secondary | ICD-10-CM

## 2020-09-07 HISTORY — DX: Basal cell carcinoma of skin, unspecified: C44.91

## 2020-09-07 MED ORDER — HYDROQUINONE 4 % EX CREA: TOPICAL_CREAM | Freq: Two times a day (BID) | CUTANEOUS | 2 refills | Status: DC

## 2020-09-07 NOTE — Patient Instructions (Signed)

## 2020-09-14 ENCOUNTER — Encounter: Payer: Self-pay | Admitting: Dermatology

## 2020-09-15 ENCOUNTER — Telehealth: Payer: Self-pay | Admitting: *Deleted

## 2020-09-15 ENCOUNTER — Encounter: Payer: Self-pay | Admitting: Dermatology

## 2020-09-15 ENCOUNTER — Encounter: Payer: Self-pay | Admitting: *Deleted

## 2020-09-15 NOTE — Progress Notes (Signed)
   Follow-Up Visit   Subjective  Amanda Salazar is a 60 y.o. female who presents for the following: Annual Exam (Above left ear- new spot, upper back + itchy, chest- red sore spots that come & go, left shoulder- hard bump).  Multiple skin issues that she had wished to discuss today.  At the conclusion of the visit she first brought up her wish to have her hair loss evaluated; I provided an abbreviated explanation but suggested she either schedule with me for more complete examination or allow me to schedule her at one of the hair loss clinics at a tertiary center. Location:  Duration:  Quality:  Associated Signs/Symptoms: Modifying Factors:  Severity:  Timing: Context:   Objective  Well appearing patient in no apparent distress; mood and affect are within normal limits. Objective  Head to Toe: Head to toe exam done today. No signs of atypical moles, or melanoma.  Objective  Left Upper Back: Erythematous patches with gritty scale.  Left Temporal Scalp         Objective  Pubic, Right Lower Leg - Posterior: Stuck-on, waxy papules and plaques.     A full examination was performed including scalp, head, eyes, ears, nose, lips, neck, chest, axillae, abdomen, back, buttocks, bilateral upper extremities, bilateral lower extremities, hands, feet, fingers, toes, fingernails, and toenails. All findings within normal limits unless otherwise noted below.   Assessment & Plan    Skin exam for malignant neoplasm Head to Toe  Yearly skin check  AK (actinic keratosis) Left Upper Back  Destruction of lesion - Left Upper Back Complexity: simple   Destruction method: electrodesiccation and curettage   Informed consent: discussed and consent obtained   Timeout:  patient name, date of birth, surgical site, and procedure verified Anesthesia: the lesion was anesthetized in a standard fashion   Anesthetic:  1% lidocaine w/ epinephrine 1-100,000 local infiltration Curettage  performed in three different directions: Yes   Curettage cycles:  5 Margin per side (cm):  0.1 Hemostasis achieved with:  aluminum chloride Outcome: patient tolerated procedure well with no complications   Post-procedure details: wound care instructions given    Neoplasm of uncertain behavior of skin Left Temporal Scalp  Skin / nail biopsy Type of biopsy: tangential   Informed consent: discussed and consent obtained   Timeout: patient name, date of birth, surgical site, and procedure verified   Procedure prep:  Patient was prepped and draped in usual sterile fashion (Non sterile) Prep type:  Chlorhexidine Anesthesia: the lesion was anesthetized in a standard fashion   Anesthetic:  1% lidocaine w/ epinephrine 1-100,000 local infiltration Instrument used: flexible razor blade   Hemostasis achieved with: ferric subsulfate   Outcome: patient tolerated procedure well   Post-procedure details: wound care instructions given    Specimen 1 - Surgical pathology Differential Diagnosis: R/O BCC vs SCC  Check Margins: No  Dermatofibroma (2) Left Lower Leg - Anterior; Left Lower Leg - Posterior  Seborrheic keratosis (2) Right Lower Leg - Posterior; Pubic  Hemangioma of skin Right Thigh - Posterior  Venous lake Left Flank  Benign okay to leave.      I, Lavonna Monarch, MD, have reviewed all documentation for this visit.  The documentation on 09/15/20 for the exam, diagnosis, procedures, and orders are all accurate and complete.

## 2020-09-15 NOTE — Telephone Encounter (Signed)
Pathology to patient, surgery scheduled

## 2020-09-15 NOTE — Telephone Encounter (Signed)
-----   Message from Lavonna Monarch, MD sent at 09/15/2020  6:27 AM EST ----- Schedule surgery with Dr. Darene Lamer

## 2020-10-02 MED ORDER — ROSUVASTATIN CALCIUM 5 MG PO TABS: 5.0000 mg | ORAL_TABLET | Freq: Every day | ORAL | 2 refills | Status: DC

## 2020-10-06 DIAGNOSIS — R110 Nausea: Secondary | ICD-10-CM

## 2020-10-06 DIAGNOSIS — R197 Diarrhea, unspecified: Secondary | ICD-10-CM

## 2020-10-06 DIAGNOSIS — N390 Urinary tract infection, site not specified: Secondary | ICD-10-CM

## 2020-10-06 MED ORDER — PROMETHAZINE HCL 25 MG PO TABS
25.0000 mg | ORAL_TABLET | Freq: Four times a day (QID) | ORAL | 0 refills | Status: DC | PRN
Start: 2020-10-06 — End: 2021-03-23

## 2020-10-06 MED ORDER — CIPROFLOXACIN HCL 500 MG PO TABS: 500.0000 mg | ORAL_TABLET | Freq: Two times a day (BID) | ORAL | 0 refills | Status: DC

## 2020-10-06 MED ORDER — MECLIZINE HCL 25 MG PO TABS: ORAL_TABLET | ORAL | 0 refills | Status: DC

## 2020-10-06 MED ORDER — HYOSCYAMINE SULFATE SL 0.125 MG SL SUBL: 0.1250 mg | SUBLINGUAL_TABLET | SUBLINGUAL | 0 refills | Status: DC | PRN

## 2020-10-20 ENCOUNTER — Other Ambulatory Visit: Payer: Self-pay

## 2020-10-20 ENCOUNTER — Ambulatory Visit: Payer: Managed Care, Other (non HMO) | Admitting: Adult Health Nurse Practitioner

## 2020-10-20 ENCOUNTER — Encounter: Payer: Self-pay | Admitting: Adult Health Nurse Practitioner

## 2020-10-20 DIAGNOSIS — G4452 New daily persistent headache (NDPH): Secondary | ICD-10-CM | POA: Diagnosis not present

## 2020-10-20 DIAGNOSIS — Z7189 Other specified counseling: Secondary | ICD-10-CM | POA: Diagnosis not present

## 2020-10-20 DIAGNOSIS — J40 Bronchitis, not specified as acute or chronic: Secondary | ICD-10-CM

## 2020-10-20 DIAGNOSIS — U071 COVID-19: Secondary | ICD-10-CM

## 2020-10-20 MED ORDER — DEXAMETHASONE 0.5 MG PO TABS: ORAL_TABLET | ORAL | 0 refills | Status: DC

## 2020-10-20 NOTE — Progress Notes (Signed)
ACUTE  Assessment and Plan:  Amanda Salazar was seen today for covid exposure, cough, headache and phone visit.  Diagnoses and all orders for this visit:  Bronchitis -     dexamethasone (DECADRON) 0.5 MG tablet; Take 1 tab 3 x day - 3 days, then 2 x day - 3 days, then 1 tab daily Discussed OTC delsym if needed at night  New daily persistent headache Tylenol and nub COVID-19 Counseled about COVID-19 virus infection Discussed quarantine and spread Discussed monitoring symptoms Monitor 02 saturation, discussed goal 93% or higher    Further disposition pending results of labs. Discussed med's effects and SE's.   Over 30 minutes of non-face to face interview, exam, counseling, chart review, and critical decision making was performed.   Future Appointments  Date Time Provider Amanda Salazar  11/19/2020  3:30 PM Amanda Monarch, MD [facility]  01/27/2021  3:00 PM Amanda Comber, NP [facility] None  05/18/2021  3:00 PM Amanda Caffey, MD [facility] None    ------------------------------------------------------------------------------------------------------------------   HPI 60 y.o.female presents for evaluation of covid symptoms.  She reports this started 5 days ago.  She had some generalized body aches.  She went on vacation and thought it was just from all of the travel.  She had a mild cough and headaches but attributed this to allergy.  She did a home test on Sunday two days ago that was positive.  She continues to have headache and mild cough that is non-productive.  She is clearing her throat frequently. She is taking an OTC allergy pill, zyrtec. She has an oxygen sensor and her O2 today was 93%, pulse is 72.  She reports that she gets short of breath walking short distances that previously did not bother her.  Discussed monitoring O2 saturation and when to seek emergent care.   Patient is taking tylenol 1,000mg  BID PRN and nabumetone rotating for her headache.  She is getting  intermittent relief and she has increased her water intake for this.  Denies any change in her taste or smell.  Denies chest pains, palpitations, nausea, vomiting or fever.   Past Medical History:  Diagnosis Date  . ADD (attention deficit disorder)   . 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 Basal cell carcinoma 09/07/2020   nod- left temporal scalp  . BCC (basal cell carcinoma of skin) 04/30/2008   Right Neck  . Blurred vision   . Cataract    Mixed form OU  . 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 Seasonal allergies      Allergies  Allergen Reactions  . Iodinated Diagnostic Agents Anaphylaxis  . Erythromycin Nausea And Vomiting    Current Outpatient Medications on File Prior to Visit  Medication Sig  . ALPRAZolam (XANAX) 0.5 MG tablet 1/2-1 pill as needed daily for anxiety  . cetirizine (EQ ALLERGY RELIEF, CETIRIZINE,) 10 MG tablet Take 1 tablet Daily for Allergies  . Cholecalciferol 125 MCG (5000 UT) capsule Take 5,000 Units by mouth daily.  . Cyanocobalamin (VITAMIN B 12 PO) Take by mouth daily.  . cycloSPORINE (RESTASIS) 0.05 % ophthalmic emulsion Place 1 drop into both eyes 2 (two) times daily.  . hydroquinone 4 % cream Apply topically 2 (two) times daily.  Marland Kitchen Hyoscyamine Sulfate SL (LEVSIN/SL) 0.125 MG SUBL Place 0.125 mg under the tongue every 4 (four) hours as needed.  Marland Kitchen lisdexamfetamine (VYVANSE) 30 MG capsule Take      1 capsule       Daily  for Alertness, Focus & Concentration (Patient taking differently: Take      1 capsule       Daily       for Alertness, Focus & Concentration)  . meclizine (ANTIVERT) 25 MG tablet 1/2-1 pill up to 3 times daily for motion sickness/dizziness  . nabumetone (RELAFEN) 500 MG tablet Take 1 tablet 2 x /day with Food for Pain & Inflammation  . Omega-3 Fatty Acids (FISH OIL) 1200 MG CAPS Take 1,200 mg by mouth daily.  Marland Kitchen OVER THE COUNTER MEDICATION Turmeric daily  . promethazine (PHENERGAN) 25 MG tablet Take 1 tablet (25 mg  total) by mouth every 6 (six) hours as needed for nausea or vomiting (can cause fatigue). Max: 4 tablets per day  . rosuvastatin (CRESTOR) 5 MG tablet Take 1 tablet (5 mg total) by mouth daily.  Marland Kitchen tretinoin (RETIN-A) 0.05 % cream tretinoin 0.05 % topical cream  APPLY TO AFFECTED AREA AT BEDTIME  . triamcinolone (NASACORT) 55 MCG/ACT AERO nasal inhaler SPRAY 2 SPRAYS NASALLY DAILY  . triamcinolone cream (KENALOG) 0.1 %    No current facility-administered medications on file prior to visit.    ROS: all negative except above.   Physical Exam:  LMP 05/11/2012    General : Well sounding patient in no apparent distress HEENT: no hoarseness, no cough for duration of visit Lungs: speaks in complete sentences, no audible wheezing, no apparent distress Neurological: alert, oriented x 3 Psychiatric: pleasant, judgement appropriate      Amanda Males, DNP [facility] 10/20/2020  2:40 PM

## 2020-10-21 ENCOUNTER — Encounter: Payer: Managed Care, Other (non HMO) | Admitting: Adult Health Nurse Practitioner

## 2020-11-19 ENCOUNTER — Encounter: Payer: Self-pay | Admitting: Dermatology

## 2020-11-19 ENCOUNTER — Ambulatory Visit (INDEPENDENT_AMBULATORY_CARE_PROVIDER_SITE_OTHER): Payer: Managed Care, Other (non HMO) | Admitting: Dermatology

## 2020-11-19 ENCOUNTER — Other Ambulatory Visit: Payer: Self-pay

## 2020-11-19 DIAGNOSIS — C4441 Basal cell carcinoma of skin of scalp and neck: Secondary | ICD-10-CM

## 2020-11-19 DIAGNOSIS — C4491 Basal cell carcinoma of skin, unspecified: Secondary | ICD-10-CM

## 2020-11-19 NOTE — Patient Instructions (Signed)

## 2020-11-30 ENCOUNTER — Encounter: Payer: Self-pay | Admitting: Dermatology

## 2020-11-30 NOTE — Progress Notes (Signed)
   Follow-Up Visit   Subjective  Amanda Salazar is a 60 y.o. female who presents for the following: Procedure (Left temporal scalp).  BCC Location: Left scalp Duration:  Quality:  Associated Signs/Symptoms: Modifying Factors:  Severity:  Timing: Context: For treatment  Objective  Well appearing patient in no apparent distress; mood and affect are within normal limits. Objective  Left Temporal Scalp: Lesion identified by Dr.Rodneisha Bonnet and nurse in room.      A focused examination was performed including Head and neck.. Relevant physical exam findings are noted in the Assessment and Plan.   Assessment & Plan    Basal cell carcinoma (BCC), unspecified site Left Temporal Scalp  Destruction of lesion Complexity: simple   Destruction method: electrodesiccation and curettage   Informed consent: discussed and consent obtained   Timeout:  patient name, date of birth, surgical site, and procedure verified Anesthesia: the lesion was anesthetized in a standard fashion   Anesthetic:  1% lidocaine w/ epinephrine 1-100,000 local infiltration Curettage performed in three different directions: Yes   Electrodesiccation performed over the curetted area: Yes   Curettage cycles:  3 Lesion length (cm):  1 Lesion width (cm):  1 Margin per side (cm):  0 Final wound size (cm):  1 Hemostasis achieved with:  ferric subsulfate Outcome: patient tolerated procedure well with no complications   Additional details:  Wound innoculated with 5 fluorouracil solution.      I, Lavonna Monarch, MD, have reviewed all documentation for this visit.  The documentation on 11/30/20 for the exam, diagnosis, procedures, and orders are all accurate and complete.

## 2020-12-14 ENCOUNTER — Other Ambulatory Visit: Payer: Self-pay | Admitting: Dermatology

## 2021-01-27 ENCOUNTER — Encounter: Payer: Managed Care, Other (non HMO) | Admitting: Adult Health

## 2021-02-19 ENCOUNTER — Encounter (INDEPENDENT_AMBULATORY_CARE_PROVIDER_SITE_OTHER): Payer: Managed Care, Other (non HMO) | Admitting: Ophthalmology

## 2021-03-18 NOTE — Progress Notes (Signed)
Complete Physical  Assessment and Plan: Amanda Salazar was seen today for annual exam.  Diagnoses and all orders for this visit:  Encounter for general adult medical examination with abnormal findings       - EKG       - Due annually  Hyperlipidemia, unspecified hyperlipidemia type -     COMPLETE METABOLIC PANEL WITH GFR -     Lipid panel -  Continue diet and exercise, pt is open to initiation of Crestor 5 mg QD if cholesterol is not improved.  She never started from last visit.   Attention deficit disorder, unspecified hyperactivity presence       - Continue Vyvanse 30 mg daily for focus  Generalized anxiety disorder       - Discussed possibility of restarting Lexapro for anxiety as she is under a large amount of social stress currently but she refuses medication at this time.  Discussed the importance of diet and exercise as well as therapy to help manage symptoms.  Gastroesophageal reflux disease, unspecified whether esophagitis present -     Magnesium - Denies current symptoms and is not on medication.  Will continue to monitor  Basal cell carcinoma (BCC), unspecified site       -  Follow with Dermatology  Vitamin D deficiency -     VITAMIN D 25 Hydroxy (Vit-D Deficiency, Fractures)  Medication management -     CBC with Differential/Platelet -     COMPLETE METABOLIC PANEL WITH GFR -     Lipid panel -     TSH -     Hemoglobin A1c -     VITAMIN D 25 Hydroxy (Vit-D Deficiency, Fractures) -     Magnesium -     EKG 12-Lead -     Urinalysis, Routine w reflex microscopic -     Microalbumin / creatinine urine ratio  Screening, anemia, deficiency, iron -     CBC with Differential/Platelet  Screening for hematuria or proteinuria -     Urinalysis, Routine w reflex microscopic -     Microalbumin / creatinine urine ratio  Screening for diabetes mellitus -     Hemoglobin A1c  Screening for thyroid disorder -     TSH  Screening for cardiovascular condition -     EKG  12-Lead  Screening mammogram for breast cancer -     MM DIAG BREAST TOMO BILATERAL; Future  Screening for osteoporosis -     DG Bone Density; Future  Screening for cervical cancer -     PAP, TP IMAGING, WNL RFLX HPV    Discussed med's effects and SE's. Screening labs and tests as requested with regular follow-up as recommended.  HPI 60 y.o. female  presents for a complete physical.  She is working from home, she is working all the time, she will have a glass of champagne and nuts. Husband had brain bleed and blood clot that caused vision loss in 1 eye- increased stress. Daughter has been recently shown to have a small brain aneurysm and going to follow with interventional radiologist. Son also blacked out recently.  Very stressed about son, he just went to college and he also has brain aneurysm risk.    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 This is also causing stress  BMI is Body mass index is 23.37 kg/m., she is working on diet and exercise. Wt Readings from Last 3 Encounters:  03/23/21 127 lb 12.8 oz (  58 kg)  06/22/20 128 lb (58.1 kg)  02/19/20 127 lb (57.6 kg)    Her blood pressure has been controlled at home, today their BP is BP: 118/72 She does not workout. She denies chest pain, shortness of breath, dizziness.  She is not on cholesterol medication and denies myalgias. Her cholesterol is at goal. The cholesterol last visit was:   She never started cholesterol medication because she was afraid of brain fog   Lab Results  Component Value Date   CHOL 226 (H) 06/22/2020   HDL 63 06/22/2020   LDLCALC 144 (H) 06/22/2020   LDLDIRECT 151.2 01/03/2008   TRIG 89 06/22/2020   CHOLHDL 3.6 06/22/2020   Last A1C in the office was:  Lab Results  Component Value Date   HGBA1C 5.0 10/22/2019   Patient is on Vitamin D supplement.  Lab Results  Component Value Date   VD25OH 40 02/19/2020      Current Medications:   Current  Outpatient Medications (Endocrine & Metabolic):    dexamethasone (DECADRON) 0.5 MG tablet, Take 1 tab 3 x day - 3 days, then 2 x day - 3 days, then 1 tab daily (Patient not taking: Reported on 03/23/2021)  Current Outpatient Medications (Cardiovascular):    rosuvastatin (CRESTOR) 5 MG tablet, Take 1 tablet (5 mg total) by mouth daily. (Patient not taking: Reported on 03/23/2021)  Current Outpatient Medications (Respiratory):    cetirizine (EQ ALLERGY RELIEF, CETIRIZINE,) 10 MG tablet, Take 1 tablet Daily for Allergies   triamcinolone (NASACORT) 55 MCG/ACT AERO nasal inhaler, SPRAY 2 SPRAYS NASALLY DAILY   promethazine (PHENERGAN) 25 MG tablet, Take 1 tablet (25 mg total) by mouth every 6 (six) hours as needed for nausea or vomiting (can cause fatigue). Max: 4 tablets per day (Patient not taking: Reported on 03/23/2021)  Current Outpatient Medications (Analgesics):    nabumetone (RELAFEN) 500 MG tablet, Take 1 tablet 2 x /day with Food for Pain & Inflammation  Current Outpatient Medications (Hematological):    Cyanocobalamin (VITAMIN B 12 PO), Take by mouth daily.  Current Outpatient Medications (Other):    Cholecalciferol 125 MCG (5000 UT) capsule, Take 5,000 Units by mouth daily.   cycloSPORINE (RESTASIS) 0.05 % ophthalmic emulsion, Place 1 drop into both eyes 2 (two) times daily.   hydroquinone 4 % cream, Apply topically 2 (two) times daily.   meclizine (ANTIVERT) 25 MG tablet, 1/2-1 pill up to 3 times daily for motion sickness/dizziness   Omega-3 Fatty Acids (FISH OIL) 1200 MG CAPS, Take 1,200 mg by mouth daily.   OVER THE COUNTER MEDICATION, Turmeric daily   tretinoin (RETIN-A) 0.05 % cream, tretinoin 0.05 % topical cream  APPLY TO AFFECTED AREA AT BEDTIME   ALPRAZolam (XANAX) 0.5 MG tablet, 1/2-1 pill as needed daily for anxiety (Patient not taking: Reported on 03/23/2021)   Hyoscyamine Sulfate SL (LEVSIN/SL) 0.125 MG SUBL, Place 0.125 mg under the tongue every 4 (four) hours as needed.  (Patient not taking: Reported on 03/23/2021)   lisdexamfetamine (VYVANSE) 30 MG capsule, Take      1 capsule       Daily       for Alertness, Focus & Concentration (Patient not taking: Reported on 03/23/2021)   triamcinolone cream (KENALOG) 0.1 %, APPLY TO AFFECTED AREA EVERY DAY (Patient not taking: Reported on 03/23/2021)  Health Maintenance:   Immunization History  Administered Date(s) Administered   Influenza Inj Mdck Quad Pf 09/29/2016   PFIZER(Purple Top)SARS-COV-2 Vaccination 11/07/2019, 12/02/2019   Tdap 04/16/2012  Tetanus: 2013 Pneumovax: N/A Prevnar 13: due age 89 Flu vaccine: get flu shot Shingrix: due at 40 , discussed  Pap: 2017 neg HPV, Due today MGM: 05/12/20 negative repeat 1 year DEXA: Schedule today Colonoscopy:2011 Dr. Oletta Lamas due 10 years , scheduled 05/07/21 Ct head 05/2012 EGD:2009  Medical History:  Past Medical History:  Diagnosis Date   ADD (attention deficit disorder)    Atypical nevus 01/11/1995   Medial Lower Abdomen - Slight   Basal cell carcinoma 09/07/2020   nod- left temporal scalp   BCC (basal cell carcinoma of skin) 04/30/2008   Right Neck   Blurred vision    Cataract    Mixed form OU   Hemangioma    on liver   Seasonal allergies    Allergies Allergies  Allergen Reactions   Iodinated Diagnostic Agents Anaphylaxis   Erythromycin Nausea And Vomiting    SURGICAL HISTORY She  has a past surgical history that includes Breast surgery; Cesarean section; Eye surgery; and Breast excisional biopsy (Bilateral).   FAMILY HISTORY Her family history includes Hypertension in her mother.   SOCIAL HISTORY She  reports that she has never smoked. She has never used smokeless tobacco. She reports current alcohol use. She reports that she does not use drugs.  Review of Systems  Constitutional: Negative.  Negative for chills and fever.  HENT: Negative.  Negative for congestion, hearing loss, sinus pain, sore throat and tinnitus.   Eyes: Negative.   Negative for blurred vision and double vision.  Respiratory: Negative.  Negative for cough, hemoptysis, sputum production, shortness of breath and wheezing.   Cardiovascular: Negative.  Negative for chest pain, palpitations and leg swelling.  Gastrointestinal: Negative.  Negative for abdominal pain, constipation, diarrhea, heartburn, nausea and vomiting.  Genitourinary: Negative.  Negative for dysuria and urgency.  Musculoskeletal: Negative.  Negative for back pain, falls, joint pain, myalgias and neck pain.  Skin: Negative.  Negative for rash.  Neurological: Negative.  Negative for dizziness, tingling, tremors, sensory change, speech change, focal weakness, seizures, loss of consciousness, weakness and headaches.  Endo/Heme/Allergies: Negative.  Does not bruise/bleed easily.  Psychiatric/Behavioral:  Positive for depression and memory loss. Negative for hallucinations, substance abuse and suicidal ideas. The patient is nervous/anxious and has insomnia.     Physical Exam: Estimated body mass index is 23.37 kg/m as calculated from the following:   Height as of this encounter: '5\' 2"'$  (1.575 m).   Weight as of this encounter: 127 lb 12.8 oz (58 kg). BP 118/72   Pulse 88   Temp 97.9 F (36.6 C)   Resp 16   Ht '5\' 2"'$  (1.575 m)   Wt 127 lb 12.8 oz (58 kg)   LMP 05/11/2012   SpO2 99%   BMI 23.37 kg/m  General Appearance: Well nourished, in no apparent distress. Eyes: PERRLA, EOMs, conjunctiva no swelling or erythema, normal fundi and vessels. Sinuses: No Frontal/maxillary tenderness ENT/Mouth: Ext aud canals clear, normal light reflex with TMs without erythema, bulging.  Good dentition. No erythema, swelling, or exudate on post pharynx. Tonsils not swollen or erythematous. Hearing normal.  Neck: Supple, thyroid normal. No bruits Respiratory: Respiratory effort normal, BS equal bilaterally without rales, rhonchi, wheezing or stridor. Cardio: RRR without murmurs, rubs or gallops. Brisk  peripheral pulses without edema.  Chest: symmetric, with normal excursions and percussion. Breasts: Symmetric, without lumps, nipple discharge, retractions. Abdomen: Soft, +BS. Non tender, no guarding, rebound, hernias, masses, or organomegaly. .  Lymphatics: Non tender without lymphadenopathy.  Genitourinary: Normal external  genitalia.  Difficulty with speculum exam due to some vaginal atrophy. Normal-appearing vaginal vault and cervix.  Endocervical & exocervical  Papanicolaou sample obtained.  Bimanual exam without abnormality.  Musculoskeletal: Full ROM all peripheral extremities,5/5 strength, and normal gait. Skin: Warm, dry without rashes, lesions, ecchymosis.  Neuro: Cranial nerves intact, reflexes equal bilaterally. Normal muscle tone, no cerebellar symptoms. Sensation intact.  Psych: Awake and oriented X 3, normal affect, Insight and Judgment appropriate.   EKG:  WNL,no ST changes   [facility]  03/23/2021

## 2021-03-23 ENCOUNTER — Encounter: Payer: Self-pay | Admitting: Nurse Practitioner

## 2021-03-23 ENCOUNTER — Ambulatory Visit (INDEPENDENT_AMBULATORY_CARE_PROVIDER_SITE_OTHER): Payer: Managed Care, Other (non HMO) | Admitting: Nurse Practitioner

## 2021-03-23 ENCOUNTER — Other Ambulatory Visit: Payer: Self-pay

## 2021-03-23 VITALS — BP 118/72 | HR 88 | Temp 97.9°F | Resp 16 | Ht 62.0 in | Wt 127.8 lb

## 2021-03-23 DIAGNOSIS — F411 Generalized anxiety disorder: Secondary | ICD-10-CM

## 2021-03-23 DIAGNOSIS — Z1329 Encounter for screening for other suspected endocrine disorder: Secondary | ICD-10-CM

## 2021-03-23 DIAGNOSIS — Z79899 Other long term (current) drug therapy: Secondary | ICD-10-CM | POA: Diagnosis not present

## 2021-03-23 DIAGNOSIS — Z136 Encounter for screening for cardiovascular disorders: Secondary | ICD-10-CM

## 2021-03-23 DIAGNOSIS — Z131 Encounter for screening for diabetes mellitus: Secondary | ICD-10-CM | POA: Diagnosis not present

## 2021-03-23 DIAGNOSIS — K219 Gastro-esophageal reflux disease without esophagitis: Secondary | ICD-10-CM

## 2021-03-23 DIAGNOSIS — Z130 Encounter for screening for diseases of the blood and blood-forming organs and certain disorders involving the immune mechanism: Secondary | ICD-10-CM

## 2021-03-23 DIAGNOSIS — F988 Other specified behavioral and emotional disorders with onset usually occurring in childhood and adolescence: Secondary | ICD-10-CM

## 2021-03-23 DIAGNOSIS — Z124 Encounter for screening for malignant neoplasm of cervix: Secondary | ICD-10-CM

## 2021-03-23 DIAGNOSIS — I10 Essential (primary) hypertension: Secondary | ICD-10-CM

## 2021-03-23 DIAGNOSIS — Z1389 Encounter for screening for other disorder: Secondary | ICD-10-CM | POA: Diagnosis not present

## 2021-03-23 DIAGNOSIS — C4491 Basal cell carcinoma of skin, unspecified: Secondary | ICD-10-CM

## 2021-03-23 DIAGNOSIS — Z0000 Encounter for general adult medical examination without abnormal findings: Secondary | ICD-10-CM | POA: Diagnosis not present

## 2021-03-23 DIAGNOSIS — Z13220 Encounter for screening for lipoid disorders: Secondary | ICD-10-CM | POA: Diagnosis not present

## 2021-03-23 DIAGNOSIS — E559 Vitamin D deficiency, unspecified: Secondary | ICD-10-CM

## 2021-03-23 DIAGNOSIS — E785 Hyperlipidemia, unspecified: Secondary | ICD-10-CM

## 2021-03-23 DIAGNOSIS — Z13820 Encounter for screening for osteoporosis: Secondary | ICD-10-CM

## 2021-03-23 DIAGNOSIS — Z0001 Encounter for general adult medical examination with abnormal findings: Secondary | ICD-10-CM

## 2021-03-23 DIAGNOSIS — Z1231 Encounter for screening mammogram for malignant neoplasm of breast: Secondary | ICD-10-CM

## 2021-03-23 NOTE — Patient Instructions (Signed)

## 2021-03-24 LAB — URINALYSIS, ROUTINE W REFLEX MICROSCOPIC
Bacteria, UA: NONE SEEN /HPF
Bilirubin Urine: NEGATIVE
Glucose, UA: NEGATIVE
Hyaline Cast: NONE SEEN /LPF
Ketones, ur: NEGATIVE
Nitrite: NEGATIVE
Protein, ur: NEGATIVE
Specific Gravity, Urine: 1.006 (ref 1.001–1.035)
Squamous Epithelial / HPF: NONE SEEN /HPF (ref <=5)
pH: 5.5 (ref 5.0–8.0)

## 2021-03-24 LAB — LIPID PANEL
Cholesterol: 257 mg/dL — ABNORMAL HIGH (ref <200)
HDL: 67 mg/dL (ref >=50)
LDL Cholesterol (Calc): 168 mg/dL (calc) — ABNORMAL HIGH
Non-HDL Cholesterol (Calc): 190 mg/dL (calc) — ABNORMAL HIGH (ref <130)
Total CHOL/HDL Ratio: 3.8 (calc) (ref <5.0)
Triglycerides: 99 mg/dL (ref <150)

## 2021-03-24 LAB — MICROALBUMIN / CREATININE URINE RATIO
Creatinine, Urine: 51 mg/dL (ref 20–275)
Microalb Creat Ratio: 12 mcg/mg creat (ref <30)
Microalb, Ur: 0.6 mg/dL

## 2021-03-24 LAB — CBC WITH DIFFERENTIAL/PLATELET
Absolute Monocytes: 331 cells/uL (ref 200–950)
Basophils Absolute: 19 cells/uL (ref 0–200)
Basophils Relative: 0.4 %
Eosinophils Absolute: 110 cells/uL (ref 15–500)
Eosinophils Relative: 2.3 %
HCT: 43.5 % (ref 35.0–45.0)
Hemoglobin: 14.9 g/dL (ref 11.7–15.5)
Lymphs Abs: 1690 cells/uL (ref 850–3900)
MCH: 31.2 pg (ref 27.0–33.0)
MCHC: 34.3 g/dL (ref 32.0–36.0)
MCV: 91.2 fL (ref 80.0–100.0)
MPV: 11.3 fL (ref 7.5–12.5)
Monocytes Relative: 6.9 %
Neutro Abs: 2650 cells/uL (ref 1500–7800)
Neutrophils Relative %: 55.2 %
Platelets: 270 10*3/uL (ref 140–400)
RBC: 4.77 10*6/uL (ref 3.80–5.10)
RDW: 12.3 % (ref 11.0–15.0)
Total Lymphocyte: 35.2 %
WBC: 4.8 10*3/uL (ref 3.8–10.8)

## 2021-03-24 LAB — COMPLETE METABOLIC PANEL WITH GFR
AG Ratio: 1.8 (calc) (ref 1.0–2.5)
ALT: 13 U/L (ref 6–29)
AST: 18 U/L (ref 10–35)
Albumin: 4.7 g/dL (ref 3.6–5.1)
Alkaline phosphatase (APISO): 100 U/L (ref 37–153)
BUN: 12 mg/dL (ref 7–25)
CO2: 28 mmol/L (ref 20–32)
Calcium: 9.5 mg/dL (ref 8.6–10.4)
Chloride: 106 mmol/L (ref 98–110)
Creat: 0.66 mg/dL (ref 0.50–1.05)
Globulin: 2.6 g/dL (calc) (ref 1.9–3.7)
Glucose, Bld: 91 mg/dL (ref 65–99)
Potassium: 4.2 mmol/L (ref 3.5–5.3)
Sodium: 141 mmol/L (ref 135–146)
Total Bilirubin: 0.4 mg/dL (ref 0.2–1.2)
Total Protein: 7.3 g/dL (ref 6.1–8.1)
eGFR: 100 mL/min/{1.73_m2} (ref >=60)

## 2021-03-24 LAB — HEMOGLOBIN A1C
Hgb A1c MFr Bld: 5 % of total Hgb (ref <5.7)
Mean Plasma Glucose: 97 mg/dL
eAG (mmol/L): 5.4 mmol/L

## 2021-03-24 LAB — MICROSCOPIC MESSAGE

## 2021-03-24 LAB — TSH: TSH: 1.99 mIU/L (ref 0.40–4.50)

## 2021-03-24 LAB — MAGNESIUM: Magnesium: 2.3 mg/dL (ref 1.5–2.5)

## 2021-03-24 LAB — VITAMIN D 25 HYDROXY (VIT D DEFICIENCY, FRACTURES): Vit D, 25-Hydroxy: 31 ng/mL (ref 30–100)

## 2021-03-25 LAB — PAP, TP IMAGING W/ HPV RNA, RFLX HPV TYPE 16,18/45: HPV DNA High Risk: NOT DETECTED

## 2021-03-25 LAB — PAP, TP IMAGING, WNL RFLX HPV

## 2021-04-15 ENCOUNTER — Encounter: Payer: Managed Care, Other (non HMO) | Admitting: Adult Health

## 2021-05-07 LAB — HM COLONOSCOPY

## 2021-05-11 NOTE — Progress Notes (Signed)
[facility] Note  05/12/2021     CHIEF COMPLAINT Patient presents for Retina Follow Up   HISTORY OF PRESENT ILLNESS: Amanda Salazar is a 60 y.o. female who presents to the clinic today for:   HPI     Retina Follow Up   Patient presents with  Retinal Break/Detachment.  In left eye.  Severity is mild.  Duration of 1 year.  Since onset it is gradually worsening.  I, the attending physician,  performed the HPI with the patient and updated documentation appropriately.        Comments   Pt here for 1 year ret f/u for retinal tear of OD. Pt reports shes had some difficulty with vision recently. She reports film over vision as well as worsening floaters. Floaters worsened about a month ago with the film showing up within 2 weeks. Pt is unable to tell which eye specifically is effected. She does report some wavy lines that seem to come and go. Pt does have developing cataracts OU.       Last edited by [name], MD on 05/16/2021 12:41 AM.    Pt states for the past 1-2 weeks she has felt like there has been a "film" over her vision  Referring physician: [name], MD [address] [place],  [place] [id number]  HISTORICAL INFORMATION:   Selected notes from the [facility] Referred by Dr. Zigmund Daniel for possible RD LEE- 04.27.18 (JDM) [BCVA OD: 20/20-1 OS: 20/20-1]   CURRENT MEDICATIONS: Current Outpatient Medications (Ophthalmic Drugs)  Medication Sig   cycloSPORINE (RESTASIS) 0.05 % ophthalmic emulsion Place 1 drop into both eyes 2 (two) times daily.   No current facility-administered medications for this visit. (Ophthalmic Drugs)   Current Outpatient Medications (Other)  Medication Sig   cetirizine (EQ ALLERGY RELIEF, CETIRIZINE,) 10 MG tablet Take 1 tablet Daily for Allergies   Cholecalciferol 125 MCG (5000 UT) capsule Take 5,000 Units by mouth daily.   Cyanocobalamin (VITAMIN B 12 PO) Take by mouth daily.    hydroquinone 4 % cream Apply topically 2 (two) times daily.   lisdexamfetamine (VYVANSE) 30 MG capsule Take      1 capsule       Daily       for Alertness, Focus & Concentration (Patient not taking: Reported on 03/23/2021)   meclizine (ANTIVERT) 25 MG tablet 1/2-1 pill up to 3 times daily for motion sickness/dizziness   nabumetone (RELAFEN) 500 MG tablet Take 1 tablet 2 x /day with Food for Pain & Inflammation   Omega-3 Fatty Acids (FISH OIL) 1200 MG CAPS Take 1,200 mg by mouth daily.   OVER THE COUNTER MEDICATION Turmeric daily   tretinoin (RETIN-A) 0.05 % cream tretinoin 0.05 % topical cream  APPLY TO AFFECTED AREA AT BEDTIME   triamcinolone (NASACORT) 55 MCG/ACT AERO nasal inhaler SPRAY 2 SPRAYS NASALLY DAILY   No current facility-administered medications for this visit. (Other)      REVIEW OF SYSTEMS: ROS   Positive for: Gastrointestinal, Eyes, Psychiatric, Heme/Lymph Negative for: Constitutional, Neurological, Skin, Genitourinary, Musculoskeletal, HENT, Endocrine, Cardiovascular, Respiratory, Allergic/Imm Last edited by Kingsley Spittle, COT on 05/12/2021  2:42 PM.        ALLERGIES Allergies  Allergen Reactions   Iodinated Diagnostic Agents Anaphylaxis   Erythromycin Nausea And Vomiting    PAST MEDICAL HISTORY Past Medical History:  Diagnosis Date   ADD (attention deficit disorder)    Atypical nevus 01/11/1995   Medial Lower  Abdomen - Slight   Basal cell carcinoma 09/07/2020   nod- left temporal scalp   BCC (basal cell carcinoma of skin) 04/30/2008   Right Neck   Blurred vision    Cataract    Mixed form OU   Hemangioma    on liver   Seasonal allergies    Past Surgical History:  Procedure Laterality Date   BREAST EXCISIONAL BIOPSY Bilateral    BREAST SURGERY     biopsy x 3   CESAREAN SECTION     x 4   EYE SURGERY      FAMILY HISTORY Family History  Problem Relation Age of Onset   Hypertension Mother     SOCIAL HISTORY Social History   Tobacco  Use   Smoking status: Never   Smokeless tobacco: Never  Vaping Use   Vaping Use: Never used  Substance Use Topics   Alcohol use: Yes    Comment: glass wine most nights   Drug use: No         OPHTHALMIC EXAM:  Base Eye Exam     Visual Acuity (Snellen - Linear)       Right Left   Dist [place] 20/40 20/25   Dist ph [place] 20/25 -1          Tonometry (Tonopen, 2:53 PM)       Right Left   Pressure 14 15         Pupils       Dark Light Shape React APD   Right 3 2 Round Brisk None   Left 3 2 Round Brisk None         Visual Fields (Counting fingers)       Left Right    Full Full         Extraocular Movement       Right Left    Full, Ortho Full, Ortho         Neuro/Psych     Oriented x3: Yes   Mood/Affect: Normal         Dilation     Both eyes: 1.0% Mydriacyl, 2.5% Phenylephrine @ 2:54 PM           Slit Lamp and Fundus Exam     Slit Lamp Exam       Right Left   Lids/Lashes Mild Meibomian gland dysfunction, mild Telangiectasia Mild Meibomian gland dysfunction, mild Telangiectasia   Conjunctiva/Sclera White and quiet White and quiet   Cornea Arcus, 4 RK scars at 1200, 0300, 0600, and 0900, trace Punctate epithelial erosions, decreased TBUT Arcus, trace Punctate epithelial erosions, 4 RK scars at 1100, 0200, 0500, 0800   Anterior Chamber Deep and quiet Deep and quiet   Iris Round and dilated Round and dilated   Lens 2-3+ Nuclear sclerosis, 2-3+ Cortical cataract 2-3+ Nuclear sclerosis, 2-3+ Cortical cataract   Vitreous Mild Vitreous syneresis, Posterior vitreous detachment, Weiss ring, no pigment, vitreous condensations Vitreous syneresis, Posterior vitreous detachment, Weiss ring, vitreous condensations         Fundus Exam       Right Left   Disc Pink and Sharp, Compact Pink and Sharp, Compact   C/D Ratio 0.3 0.4   Macula Flat, good foveal reflex, Nasal Epiretinal membrane with pucker and striae and mild central thickening - stable from  prior, No heme  Flat, Good foveal reflex, mild Retinal pigment epithelial mottling, No heme or edema   Vessels mild attenuation, mild tortuousity attenuated, mild tortuousity, mild Copper wiring  Periphery Attached, small hole with good laser surrounding at 1100, No RT/RD, No heme  Attached, small horseshoe tear at 0900 - good laser changes surrounding, No new RT/RD            IMAGING AND PROCEDURES  Imaging and Procedures for 11/17/17  OCT, Retina - OU - Both Eyes       Right Eye Quality was good. Central Foveal Thickness: 409. Progression has worsened. Findings include macular pucker, epiretinal membrane, abnormal foveal contour, no IRF, no SRF (ERM with pucker, fibrosis and progression of central retinal thickening).   Left Eye Quality was good. Central Foveal Thickness: 248. Progression has been stable. Findings include normal foveal contour, no IRF, no SRF.   Notes *Images captured and stored on drive  Diagnosis / Impression:  OD: Nasal ERM - ERM with pucker, fibrosis and progression of central retinal thickening OS: NFP, No IRF/SRF  Clinical management:  See below  Abbreviations: NFP - Normal foveal profile. CME - cystoid macular edema. PED - pigment epithelial detachment. IRF - intraretinal fluid. SRF - subretinal fluid. EZ - ellipsoid zone. ERM - epiretinal membrane. ORA - outer retinal atrophy. ORT - outer retinal tubulation. SRHM - subretinal hyper-reflective material             ASSESSMENT/PLAN:    ICD-10-CM   1. Retinal tear of left eye  H33.312     2. Posterior vitreous detachment of both eyes  H43.813     3. History of repair of retinal tear by laser photocoagulation  Z98.890     4. 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 Epiretinal membrane (ERM) of right eye  H35.371     6. Retinal edema  H35.81 OCT, Retina - OU - Both Eyes    7. Combined forms of age-related cataract of both eyes  H25.813     1,2. PVD with retinal tear, OS    -  small horseshoe tear located at 0900 -- no SRF  - S/P laser retinopexy OS (03.11.19) - good laser changes  - no new tears or detachment on repeat 360 peripheral exam  - f/u 1 year  3. History of retinal tear OD  - 1100 oclock position  - s/p laser retinopexy with Dr. Zigmund Daniel  - stable  - continue to monitor  4,5. Epiretinal membrane, OD  - BCVA remains good and stable 20/25-1  - OCT shows stable nasal ERM with pucker, fibrosis and progression of central retinal thickening  - no indication for surgery at this time  - continue to monitor  - f/u 1 year, sooner prn  6. No retinal edema on exam or OCT  7. Combined form age-related cataract OU-   - The symptoms of cataract, surgical options, and treatments and risks were discussed with patient.  - discussed diagnosis and progression  - not yet visually significant  - continue to monitor for now  Ophthalmic Meds Ordered this visit:  No orders of the defined types were placed in this encounter.    Return in about 1 year (around 05/12/2022) for f/u ERM OD, DFE, OCT.  There are no Patient Instructions on file for this visit.  This document serves as a record of services personally performed by Gardiner Sleeper, MD, PhD. It was created on their behalf by Estill Bakes, COT an ophthalmic technician. The creation of this record is the provider's dictation and/or activities during the visit.    Electronically signed by: Estill Bakes, COT  10.4.22 @ 12:49 AM   This document serves as a record of services personally performed by Gardiner Sleeper, MD, PhD. It was created on their behalf by San Jetty. Owens Shark, OA an ophthalmic technician. The creation of this record is the provider's dictation and/or activities during the visit.    Electronically signed by: San Jetty. Owens Shark, [place] 10.05.2022 12:49 AM  Gardiner Sleeper, M.D., Ph.D. Diseases & Surgery of the Retina and Vitreous [facility]  I have reviewed the above  documentation for accuracy and completeness, and I agree with the above. Gardiner Sleeper, M.D., Ph.D. 05/16/21 12:49 AM  Abbreviations: M myopia (nearsighted); A astigmatism; H hyperopia (farsighted); P presbyopia; Mrx spectacle prescription;  CTL contact lenses; OD right eye; OS left eye; OU both eyes  XT exotropia; ET esotropia; PEK punctate epithelial keratitis; PEE punctate epithelial erosions; DES dry eye syndrome; MGD meibomian gland dysfunction; ATs artificial tears; PFAT's preservative free artificial tears; [place] nuclear sclerotic cataract; PSC posterior subcapsular cataract; ERM epi-retinal membrane; PVD posterior vitreous detachment; RD retinal detachment; DM diabetes mellitus; DR diabetic retinopathy; NPDR non-proliferative diabetic retinopathy; PDR proliferative diabetic retinopathy; CSME clinically significant macular edema; DME diabetic macular edema; dbh dot blot hemorrhages; CWS cotton wool spot; POAG primary open angle glaucoma; C/D cup-to-disc ratio; HVF humphrey visual field; GVF goldmann visual field; OCT optical coherence tomography; IOP intraocular pressure; BRVO Branch retinal vein occlusion; CRVO central retinal vein occlusion; CRAO central retinal artery occlusion; BRAO branch retinal artery occlusion; RT retinal tear; SB scleral buckle; PPV pars plana vitrectomy; VH Vitreous hemorrhage; PRP panretinal laser photocoagulation; IVK intravitreal kenalog; VMT vitreomacular traction; MH Macular hole;  NVD neovascularization of the disc; NVE neovascularization elsewhere; AREDS age related eye disease study; ARMD age related macular degeneration; POAG primary open angle glaucoma; EBMD epithelial/anterior basement membrane dystrophy; ACIOL anterior chamber intraocular lens; IOL intraocular lens; PCIOL posterior chamber intraocular lens; Phaco/IOL phacoemulsification with intraocular lens placement; Hall photorefractive keratectomy; LASIK laser assisted in situ keratomileusis; HTN hypertension; DM  diabetes mellitus; COPD chronic obstructive pulmonary disease

## 2021-05-12 ENCOUNTER — Ambulatory Visit (INDEPENDENT_AMBULATORY_CARE_PROVIDER_SITE_OTHER): Payer: Managed Care, Other (non HMO) | Admitting: Ophthalmology

## 2021-05-12 ENCOUNTER — Other Ambulatory Visit: Payer: Self-pay

## 2021-05-12 ENCOUNTER — Encounter (INDEPENDENT_AMBULATORY_CARE_PROVIDER_SITE_OTHER): Payer: Self-pay | Admitting: Ophthalmology

## 2021-05-12 DIAGNOSIS — H43813 Vitreous degeneration, bilateral: Secondary | ICD-10-CM | POA: Diagnosis not present

## 2021-05-12 DIAGNOSIS — H35341 Macular cyst, hole, or pseudohole, right eye: Secondary | ICD-10-CM

## 2021-05-12 DIAGNOSIS — Z9889 Other specified postprocedural states: Secondary | ICD-10-CM

## 2021-05-12 DIAGNOSIS — H25813 Combined forms of age-related cataract, bilateral: Secondary | ICD-10-CM

## 2021-05-12 DIAGNOSIS — H3581 Retinal edema: Secondary | ICD-10-CM

## 2021-05-12 DIAGNOSIS — H35371 Puckering of macula, right eye: Secondary | ICD-10-CM | POA: Diagnosis not present

## 2021-05-12 DIAGNOSIS — H33312 Horseshoe tear of retina without detachment, left eye: Secondary | ICD-10-CM

## 2021-05-16 ENCOUNTER — Encounter (INDEPENDENT_AMBULATORY_CARE_PROVIDER_SITE_OTHER): Payer: Self-pay | Admitting: Ophthalmology

## 2021-05-18 ENCOUNTER — Encounter (INDEPENDENT_AMBULATORY_CARE_PROVIDER_SITE_OTHER): Payer: Managed Care, Other (non HMO) | Admitting: Ophthalmology

## 2021-05-19 ENCOUNTER — Encounter: Payer: Self-pay | Admitting: Internal Medicine

## 2021-05-24 ENCOUNTER — Ambulatory Visit (INDEPENDENT_AMBULATORY_CARE_PROVIDER_SITE_OTHER): Payer: Managed Care, Other (non HMO) | Admitting: Nurse Practitioner

## 2021-05-24 ENCOUNTER — Encounter: Payer: Self-pay | Admitting: Nurse Practitioner

## 2021-05-24 ENCOUNTER — Other Ambulatory Visit: Payer: Self-pay

## 2021-05-24 VITALS — BP 122/72 | HR 69 | Temp 97.7°F | Wt 128.8 lb

## 2021-05-24 DIAGNOSIS — E785 Hyperlipidemia, unspecified: Secondary | ICD-10-CM | POA: Diagnosis not present

## 2021-05-24 DIAGNOSIS — F988 Other specified behavioral and emotional disorders with onset usually occurring in childhood and adolescence: Secondary | ICD-10-CM

## 2021-05-24 DIAGNOSIS — R011 Cardiac murmur, unspecified: Secondary | ICD-10-CM | POA: Diagnosis not present

## 2021-05-24 DIAGNOSIS — R7309 Other abnormal glucose: Secondary | ICD-10-CM | POA: Diagnosis not present

## 2021-05-24 DIAGNOSIS — F411 Generalized anxiety disorder: Secondary | ICD-10-CM | POA: Diagnosis not present

## 2021-05-24 DIAGNOSIS — R404 Transient alteration of awareness: Secondary | ICD-10-CM

## 2021-05-24 NOTE — Progress Notes (Signed)
FOLLOW UP  Assessment and Plan:   Amanda Salazar was seen today for acute visit.  Diagnoses and all orders for this visit:  Generalized anxiety disorder  Continue to monitor behaviors, practice good sleep hygiene, relaxation techniques  Hyperlipidemia, unspecified hyperlipidemia type  Continue diet and exercise  Episode of altered consciousness -     CBC with Differential/Platelet -     COMPLETE METABOLIC PANEL WITH GFR -     TSH -     Hemoglobin A1c -     Cancel: Ambulatory referral to Cardiology -     EKG 12-Lead -     Cancel: Ambulatory referral to Cardiology -     Ambulatory referral to Cardiology - Pt also given information on hypoglycemia and stressed importance of eating regularly  Attention deficit disorder, unspecified hyperactivity presence  Continue behavior modifications  Heart murmur- midsystolic -     EKG 43-PIRJ -     Ambulatory referral to Cardiology    Continue diet and meds as discussed. Further disposition pending results of labs. Discussed med's effects and SE's.   Over 30 minutes of exam, counseling, chart review, and critical decision making was performed.   Future Appointments  Date Time Provider [place]  05/25/2021  8:15 AM Lavonna Monarch, MD [facility] [place]  03/23/2022  9:00 AM Magda Bernheim, NP [facility] None  05/13/2022  2:30 PM [name], MD [facility] None    ----------------------------------------------------------------------------------------------------------------------  HPI 60 y.o. female  presents for evaluation of symptoms which occurred 2 days ago.  Pt met a friend 2 days ago around 4 pm and had 1 drink and a quesadilla. She had not eaten all day until that time. She started feeling very fatigued, became nauseated, dizzy, she then laid down on the flower bed at the restaurant. She had some numbness of left hand but it resolved. EMT was called. They did EKG which was normal and had normal vitals after pulse/blood pressure were too  low.  Feels she needs to take a deep breath at times but not denies feeling short of breath.  She is feeling better today. Denies palpations, chest pain.   BMI is Body mass index is 23.56 kg/m., she has been working on diet and exercise. Wt Readings from Last 3 Encounters:  05/24/21 128 lb 12.8 oz (58.4 kg)  03/23/21 127 lb 12.8 oz (58 kg)  06/22/20 128 lb (58.1 kg)    Her blood pressure has been controlled at home, today their BP is BP: 122/72 BP Readings from Last 3 Encounters:  05/24/21 122/72  03/23/21 118/72  06/22/20 120/80     She does workout. She denies chest pain, shortness of breath, dizziness.   She is not on cholesterol medication  Her cholesterol is not at goal. The cholesterol last visit was:   Lab Results  Component Value Date   CHOL 257 (H) 03/23/2021   HDL 67 03/23/2021   LDLCALC 168 (H) 03/23/2021   LDLDIRECT 151.2 01/03/2008   TRIG 99 03/23/2021   CHOLHDL 3.8 03/23/2021   Pt does have generalized anxiety disorder. She is currently not taking any medication. Does have occasional episodes of increased anxiety related to high level of stress.  She does have ADD but is not taking medication for symptoms, tried to control with behavior modification.      Current Medications:  Current Outpatient Medications on File Prior to Visit  Medication Sig   Biotin 10 MG TABS Take by mouth.   cetirizine (EQ ALLERGY RELIEF, CETIRIZINE,) 10  MG tablet Take 1 tablet Daily for Allergies   Cholecalciferol 125 MCG (5000 UT) capsule Take 5,000 Units by mouth daily.   Cyanocobalamin (VITAMIN B 12 PO) Take by mouth daily.   cycloSPORINE (RESTASIS) 0.05 % ophthalmic emulsion Place 1 drop into both eyes 2 (two) times daily.   hydroquinone 4 % cream Apply topically 2 (two) times daily.   Omega-3 Fatty Acids (FISH OIL) 1200 MG CAPS Take 1,200 mg by mouth daily.   OVER THE COUNTER MEDICATION Turmeric daily   tretinoin (RETIN-A) 0.05 % cream tretinoin 0.05 % topical cream  APPLY TO  AFFECTED AREA AT BEDTIME   triamcinolone (NASACORT) 55 MCG/ACT AERO nasal inhaler SPRAY 2 SPRAYS NASALLY DAILY   lisdexamfetamine (VYVANSE) 30 MG capsule Take      1 capsule       Daily       for Alertness, Focus & Concentration (Patient not taking: Reported on 03/23/2021)   meclizine (ANTIVERT) 25 MG tablet 1/2-1 pill up to 3 times daily for motion sickness/dizziness (Patient not taking: Reported on 05/24/2021)   nabumetone (RELAFEN) 500 MG tablet Take 1 tablet 2 x /day with Food for Pain & Inflammation (Patient not taking: Reported on 05/24/2021)   No current facility-administered medications on file prior to visit.     Allergies:  Allergies  Allergen Reactions   Iodinated Diagnostic Agents Anaphylaxis   Erythromycin Nausea And Vomiting     Medical History:  Past Medical History:  Diagnosis Date   ADD (attention deficit disorder)    Atypical nevus 01/11/1995   Medial Lower Abdomen - Slight   Basal cell carcinoma 09/07/2020   nod- left temporal scalp   BCC (basal cell carcinoma of skin) 04/30/2008   Right Neck   Blurred vision    Cataract    Mixed form OU   Hemangioma    on liver   Seasonal allergies    Family history- Reviewed and unchanged Social history- Reviewed and unchanged   Review of Systems:  Review of Systems  Constitutional:  Negative for chills, fever and weight loss.  HENT:  Negative for congestion and hearing loss.   Eyes:  Negative for blurred vision and double vision.  Respiratory:  Negative for cough and shortness of breath.   Cardiovascular:  Negative for chest pain, palpitations, orthopnea and leg swelling.  Gastrointestinal:  Negative for abdominal pain, constipation, diarrhea, heartburn, nausea and vomiting.  Musculoskeletal:  Negative for falls, joint pain and myalgias.  Skin:  Negative for rash.  Neurological:  Positive for weakness. Negative for dizziness, tingling, tremors, loss of consciousness and headaches.       Altered conciousness   Psychiatric/Behavioral:  Negative for depression, memory loss and suicidal ideas.      Physical Exam: BP 122/72   Pulse 69   Temp 97.7 F (36.5 C)   Wt 128 lb 12.8 oz (58.4 kg)   LMP 05/11/2012   SpO2 99%   BMI 23.56 kg/m  Wt Readings from Last 3 Encounters:  05/24/21 128 lb 12.8 oz (58.4 kg)  03/23/21 127 lb 12.8 oz (58 kg)  06/22/20 128 lb (58.1 kg)   General Appearance: Well nourished, in no apparent distress. Eyes: PERRLA, EOMs, conjunctiva no swelling or erythema Sinuses: No Frontal/maxillary tenderness ENT/Mouth: Ext aud canals clear, TMs without erythema, bulging. No erythema, swelling, or exudate on post pharynx.  Tonsils not swollen or erythematous. Hearing normal.  Neck: Supple, thyroid normal.  Respiratory: Respiratory effort normal, BS equal bilaterally without rales, rhonchi, wheezing or  stridor.  Cardio: RRR , faint midsystolic murmur heard.  Brisk peripheral pulses without edema.  Abdomen: Soft, + BS.  Non tender, no guarding, rebound, hernias, masses. Lymphatics: Non tender without lymphadenopathy.  Musculoskeletal: Full ROM, 5/5 strength, Normal gait Skin: Warm, dry without rashes, lesions, ecchymosis.  Neuro: Cranial nerves intact. No cerebellar symptoms.  Psych: Awake and oriented X 3, normal affect, Insight and Judgment appropriate.  XOV:ANVBTY sinus rhythm, no ST changes  Irasema Chalk Kathyrn Drown, NP 3:23 PM [facility]

## 2021-05-24 NOTE — Patient Instructions (Signed)
Hypoglycemia Hypoglycemia occurs when the level of sugar (glucose) in the blood is too low. Hypoglycemia can happen in people who have or do not have diabetes. It can develop quickly, and it can be a medical emergency. For most people, a blood glucose level below 70 mg/dL (3.9 mmol/L) is considered hypoglycemia. Glucose is a type of sugar that provides the body's main source of energy. Certain hormones (insulin and glucagon) control the level of glucose in the blood. Insulin lowers blood glucose, and glucagon raises blood glucose. Hypoglycemia can result from having too much insulin in the bloodstream, or from not eating enough food that contains glucose. You may also have reactive hypoglycemia, which happens within 4 hours after eating a meal. What are the causes? Hypoglycemia occurs most often in people who have diabetes and may be caused by: Diabetes medicine. Not eating enough, or not eating often enough. Increased physical activity. Drinking alcohol on an empty stomach. If you do not have diabetes, hypoglycemia may be caused by: A tumor in the pancreas. Not eating enough, or not eating for long periods at a time (fasting). A severe infection or illness. Problems after having bariatric surgery. Organ failure, such as kidney or liver failure. Certain medicines. What increases the risk? Hypoglycemia is more likely to develop in people who: Have diabetes and take medicines to lower blood glucose. Abuse alcohol. Have a severe illness. What are the signs or symptoms? Symptoms vary depending on whether the condition is mild, moderate, or severe. Mild hypoglycemia Hunger. Sweating and feeling clammy. Dizziness or feeling light-headed. Sleepiness or restless sleep. Nausea. Increased heart rate. Headache. Blurry vision. Mood changes, such as irritability or anxiety. Tingling or numbness around the mouth, lips, or tongue. Moderate hypoglycemia Confusion and poor judgment. Behavior  changes. Weakness. Irregular heartbeat. A change in coordination. Severe hypoglycemia Severe hypoglycemia is a medical emergency. It can cause: Fainting. Seizures. Loss of consciousness (coma). Death. How is this diagnosed? Hypoglycemia is diagnosed with a blood test to measure your blood glucose level. This blood test is done while you are having symptoms. Your health care provider may also do a physical exam and review your medical history. How is this treated? This condition can be treated by immediately eating or drinking something that contains sugar with 15 grams of fast-acting carbohydrate, such as: 4 oz (120 mL) of fruit juice. 4 oz (120 mL) of regular soda (not diet soda). Several pieces of hard candy. Check food labels to find out how many pieces to eat for 15 grams. 1 Tbsp (15 mL) of sugar or honey. 4 glucose tablets. 1 tube of glucose gel. Treating hypoglycemia if you have diabetes If you are alert and able to swallow safely, follow the 15:15 rule: Take 15 grams of a fast-acting carbohydrate. Talk with your health care provider about how much you should take. Options for getting 15 grams of fast-acting carbohydrate include: Glucose tablets (take 4 tablets). Several pieces of hard candy. Check food labels to find out how many pieces to eat for 15 grams. 4 oz (120 mL) of fruit juice. 4 oz (120 mL) of regular soda (not diet soda). 1 Tbsp (15 mL) of sugar or honey. 1 tube of glucose gel. Check your blood glucose 15 minutes after you take the carbohydrate. If the repeat blood glucose level is still at or below 70 mg/dL (3.9 mmol/L), take 15 grams of a carbohydrate again. If your blood glucose level does not increase above 70 mg/dL (3.9 mmol/L) after 3 tries, seek emergency  medical care. After your blood glucose level returns to normal, eat a meal or a snack within 1 hour.  Treating severe hypoglycemia Severe hypoglycemia is when your blood glucose level is below 54 mg/dL (3  mmol/L). Severe hypoglycemia is a medical emergency. Get medical help right away. If you have severe hypoglycemia and you cannot eat or drink, you will need to be given glucagon. A family member or close friend should learn how to check your blood glucose and how to give you glucagon. Ask your health care provider if you need to have an emergency glucagon kit available. Severe hypoglycemia may need to be treated in a hospital. The treatment may include getting glucose through an IV. You may also need treatment for the cause of your hypoglycemia. Follow these instructions at home: General instructions Take over-the-counter and prescription medicines only as told by your health care provider. Monitor your blood glucose as told by your health care provider. If you drink alcohol: Limit how much you have to: 0-1 drink a day for women who are not pregnant. 0-2 drinks a day for men. Know how much alcohol is in your drink. In the [place] one drink equals one 12 oz bottle of beer (355 mL), one 5 oz glass of wine (148 mL), or one 1 oz glass of hard liquor (44 mL). Be sure to eat food along with drinking alcohol. Be aware that alcohol is absorbed quickly and may have lingering effects that may result in hypoglycemia later. Be sure to do ongoing glucose monitoring. Keep all follow-up visits. This is important. If you have diabetes: Always have a fast-acting carbohydrate (15 grams) option with you to treat low blood glucose. Follow your diabetes management plan as directed by your health care provider. Make sure you: Know the symptoms of hypoglycemia. It is important to treat it right away to prevent it from becoming severe. Check your blood glucose as often as told. Always check before and after exercise. Always check your blood glucose before you drive a motorized vehicle. Take your medicines as told. Follow your meal plan. Eat on time, and do not skip meals. Share your diabetes management plan with  people in your workplace, school, and household. Carry a medical alert card or wear medical alert jewelry. Where to find more information American Diabetes Association: www.diabetes.org Contact a health care provider if: You have problems keeping your blood glucose in your target range. You have frequent episodes of hypoglycemia. Get help right away if: You continue to have hypoglycemia symptoms after eating or drinking something that contains 15 grams of fast-acting carbohydrate, and you cannot get your blood glucose above 70 mg/dL (3.9 mmol/L) while following the 15:15 rule. Your blood glucose is below 54 mg/dL (3 mmol/L). You have a seizure. You faint. These symptoms may represent a serious problem that is an emergency. Do not wait to see if the symptoms will go away. Get medical help right away. Call your local emergency services (911 in the [place] Do not drive yourself to the hospital. Summary Hypoglycemia occurs when the level of sugar (glucose) in the blood is too low. Hypoglycemia can happen in people who have or do not have diabetes. It can develop quickly, and it can be a medical emergency. Make sure you know the symptoms of hypoglycemia and how to treat it. Always have a fast-acting carbohydrate option with you to treat low blood sugar. This information is not intended to replace advice given to you by your health care provider. Make   sure you discuss any questions you have with your health care provider. Document Revised: 06/25/2020 Document Reviewed: 06/25/2020 Elsevier Patient Education  2022 [employer].

## 2021-05-25 ENCOUNTER — Ambulatory Visit (INDEPENDENT_AMBULATORY_CARE_PROVIDER_SITE_OTHER): Payer: Managed Care, Other (non HMO) | Admitting: Dermatology

## 2021-05-25 ENCOUNTER — Encounter: Payer: Self-pay | Admitting: Dermatology

## 2021-05-25 DIAGNOSIS — L570 Actinic keratosis: Secondary | ICD-10-CM | POA: Diagnosis not present

## 2021-05-25 DIAGNOSIS — L309 Dermatitis, unspecified: Secondary | ICD-10-CM | POA: Diagnosis not present

## 2021-05-25 DIAGNOSIS — Z85828 Personal history of other malignant neoplasm of skin: Secondary | ICD-10-CM | POA: Diagnosis not present

## 2021-05-25 LAB — COMPLETE METABOLIC PANEL WITH GFR
AG Ratio: 1.8 (calc) (ref 1.0–2.5)
ALT: 14 U/L (ref 6–29)
AST: 19 U/L (ref 10–35)
Albumin: 4.6 g/dL (ref 3.6–5.1)
Alkaline phosphatase (APISO): 98 U/L (ref 37–153)
BUN: 11 mg/dL (ref 7–25)
CO2: 28 mmol/L (ref 20–32)
Calcium: 9.6 mg/dL (ref 8.6–10.4)
Chloride: 106 mmol/L (ref 98–110)
Creat: 0.65 mg/dL (ref 0.50–1.05)
Globulin: 2.5 g/dL (calc) (ref 1.9–3.7)
Glucose, Bld: 94 mg/dL (ref 65–99)
Potassium: 4.4 mmol/L (ref 3.5–5.3)
Sodium: 142 mmol/L (ref 135–146)
Total Bilirubin: 0.3 mg/dL (ref 0.2–1.2)
Total Protein: 7.1 g/dL (ref 6.1–8.1)
eGFR: 100 mL/min/{1.73_m2} (ref >=60)

## 2021-05-25 LAB — CBC WITH DIFFERENTIAL/PLATELET
Absolute Monocytes: 347 cells/uL (ref 200–950)
Basophils Absolute: 19 cells/uL (ref 0–200)
Basophils Relative: 0.3 %
Eosinophils Absolute: 99 cells/uL (ref 15–500)
Eosinophils Relative: 1.6 %
HCT: 42 % (ref 35.0–45.0)
Hemoglobin: 14 g/dL (ref 11.7–15.5)
Lymphs Abs: 1655 cells/uL (ref 850–3900)
MCH: 30.9 pg (ref 27.0–33.0)
MCHC: 33.3 g/dL (ref 32.0–36.0)
MCV: 92.7 fL (ref 80.0–100.0)
MPV: 10.8 fL (ref 7.5–12.5)
Monocytes Relative: 5.6 %
Neutro Abs: 4080 cells/uL (ref 1500–7800)
Neutrophils Relative %: 65.8 %
Platelets: 251 10*3/uL (ref 140–400)
RBC: 4.53 10*6/uL (ref 3.80–5.10)
RDW: 12.3 % (ref 11.0–15.0)
Total Lymphocyte: 26.7 %
WBC: 6.2 10*3/uL (ref 3.8–10.8)

## 2021-05-25 LAB — HEMOGLOBIN A1C
Hgb A1c MFr Bld: 4.8 % of total Hgb (ref <5.7)
Mean Plasma Glucose: 91 mg/dL
eAG (mmol/L): 5 mmol/L

## 2021-05-25 LAB — TSH: TSH: 1.99 mIU/L (ref 0.40–4.50)

## 2021-05-26 NOTE — Progress Notes (Signed)
Cardiology Office Note:    Date:  05/27/2021   ID:  Amanda Salazar, DOB April 11, 1961, MRN [id number]  PCP:  [name], MD  Cardiologist:  None  Electrophysiologist:  None   Referring MD: Magda Bernheim, NP   Chief Complaint/Reason for Referral: Altered consciousness, heart murmur  History of Present Illness:    Amanda Salazar is a 60 y.o. female with HLD and no other significant past cardiovascular history here for the evaluation of an episode of altered consciousness and heart murmur referred by Magda Bernheim, NP.  She saw Marta Lamas, NP on 05/24/2021. She reported feeling acutely fatigued, nauseated, and dizzy, prompting her to lie down. Also had some left hand numbness that resolved prior to EMT arrival. Pulse and blood pressure were low, other vitals normal. On-site EKG was normal. She was noted to have a murmur on exam, and referral to cardiology placed.   She notes feeling SOB with talking. Over the weekend she felt very sleepy, felt faint and had nausea. She experienced chest tightness and presyncope. 4 years ago she recalls an episode of presyncope after getting an injection. She has had fainting episodes in the past.   The patient denies palpitations, PND, orthopnea, or leg swelling. Denies cough, fever, chills. Denies nausea, vomiting. Denies dizziness or lightheadedness.    Past Medical History:  Diagnosis Date   ADD (attention deficit disorder)    Atypical nevus 01/11/1995   Medial Lower Abdomen - Slight   Basal cell carcinoma 09/07/2020   nod- left temporal scalp   BCC (basal cell carcinoma of skin) 04/30/2008   Right Neck   Blurred vision    Cataract    Mixed form OU   Hemangioma    on liver   Seasonal allergies     Past Surgical History:  Procedure Laterality Date   BREAST EXCISIONAL BIOPSY Bilateral    BREAST SURGERY     biopsy x 3   CESAREAN SECTION     x 4   EYE SURGERY      Current Medications: Current Meds  Medication Sig   Biotin 10 MG  TABS Take by mouth.   Cholecalciferol 125 MCG (5000 UT) capsule Take 5,000 Units by mouth daily.   Cyanocobalamin (VITAMIN B 12 PO) Take by mouth daily.   cycloSPORINE (RESTASIS) 0.05 % ophthalmic emulsion Place 1 drop into both eyes 2 (two) times daily.   hydroquinone 4 % cream Apply topically 2 (two) times daily.   Omega-3 Fatty Acids (FISH OIL) 1200 MG CAPS Take 1,200 mg by mouth daily.   OVER THE COUNTER MEDICATION Turmeric daily   rosuvastatin (CRESTOR) 5 MG tablet Take 5 mg by mouth daily.   tretinoin (RETIN-A) 0.05 % cream tretinoin 0.05 % topical cream  APPLY TO AFFECTED AREA AT BEDTIME   triamcinolone (NASACORT) 55 MCG/ACT AERO nasal inhaler SPRAY 2 SPRAYS NASALLY DAILY   [DISCONTINUED] cetirizine (EQ ALLERGY RELIEF, CETIRIZINE,) 10 MG tablet Take 1 tablet Daily for Allergies     Allergies:   Iodinated diagnostic agents and Erythromycin   Social History   Tobacco Use   Smoking status: Never   Smokeless tobacco: Never  Vaping Use   Vaping Use: Never used  Substance Use Topics   Alcohol use: Yes    Comment: glass wine most nights   Drug use: No     Family History: The patient's family history includes Hypertension in her mother.  ROS:   Please see the history of present illness.  All other systems reviewed and are negative.  EKGs/Labs/Other Studies Reviewed:    The following studies were reviewed today:  Coronary Calcium Score ([facility]) 07/07/2020: Findings:  Calcium score: 39 LM: 16 LAD: 23 Cx: 0 RCA: 0  Impression: Definite at least mild calcified coronary artery plaque. Mild or minimal coronary narrowings are likely.   EKG:   05/27/2021: Sinus rhythm. Rate 79 bpm.  Imaging studies that I have independently reviewed today: n/a  Recent Labs: 03/23/2021: Magnesium 2.3 05/24/2021: ALT 14; BUN 11; Creat 0.65; Hemoglobin 14.0; Platelets 251; Potassium 4.4; Sodium 142; TSH 1.99  Recent Lipid Panel    Component Value Date/Time   CHOL 257 (H)  03/23/2021 1033   TRIG 99 03/23/2021 1033   HDL 67 03/23/2021 1033   CHOLHDL 3.8 03/23/2021 1033   VLDL 27 03/17/2017 1201   LDLCALC 168 (H) 03/23/2021 1033   LDLDIRECT 151.2 01/03/2008 1015    Physical Exam:    VS:  BP (!) 88/65   Pulse 79   Ht 5\' 2"  (1.575 m)   Wt 126 lb 6.4 oz (57.3 kg)   LMP 05/11/2012   BMI 23.12 kg/m     Wt Readings from Last 5 Encounters:  05/24/21 128 lb 12.8 oz (58.4 kg)  03/23/21 127 lb 12.8 oz (58 kg)  06/22/20 128 lb (58.1 kg)  02/19/20 127 lb (57.6 kg)  10/22/19 131 lb (59.4 kg)    Constitutional: No acute distress Eyes: sclera non-icteric, normal conjunctiva and lids ENMT: normal dentition, moist mucous membranes Cardiovascular: regular rhythm, normal rate, no murmur. S1 and S2 normal. No jugular venous distention.  Respiratory: clear to auscultation bilaterally GI : normal bowel sounds, soft and nontender. No distention.   MSK: extremities warm, well perfused. No edema.  NEURO: grossly nonfocal exam, moves all extremities. PSYCH: alert and oriented x 3, normal mood and affect.   ASSESSMENT:    1. SOB (shortness of breath)   2. Pre-syncope   3. Pure hypercholesterolemia   4. Coronary artery calcification    PLAN:    1. SOB (shortness of breath) 2. Pre-syncope - will obtain an echocardiogram to evaluate reported murmur, presyncope and SOB. Would also consider cardiac monitor for recurrent or increasing symptoms.   3. Pure hypercholesterolemia 4. Coronary artery calcifications - continue crestor 5 mg daily. Would recommend uptitration in setting of cor cals and elevated LDL. Will review at follow up.  Cherlynn Kaiser, MD, [place]   Shared Decision Making/Informed Consent:       Medication Adjustments/Labs and Tests Ordered: Current medicines are reviewed at length with the patient today.  Concerns regarding medicines are outlined above.   Orders Placed This Encounter  Procedures   EKG 12-Lead    ECHOCARDIOGRAM COMPLETE     No orders of the defined types were placed in this encounter.    Patient Instructions  Medication Instructions:  Your Physician recommend you continue on your current medication as directed.    *If you need a refill on your cardiac medications before your next appointment, please call your pharmacy*  Lab Work: None ordered  Testing/Procedures: Your physician has requested that you have an echocardiogram. Echocardiography is a painless test that uses sound waves to create images of your heart. It provides your doctor with information about the size and shape of your heart and how well your heart's chambers and valves are working. This procedure takes approximately one hour. There are no restrictions for this procedure. [address]  [address]   Follow-Up: At [facility], you and your health needs are our priority.  As part of our continuing mission to provide you with exceptional heart care, we have created designated Provider Care Teams.  These Care Teams include your primary Cardiologist (physician) and Advanced Practice Providers (APPs -  Physician Assistants and Nurse Practitioners) who all work together to provide you with the care you need, when you need it.  We recommend signing up for the patient portal called "MyChart".  Sign up information is provided on this After Visit Summary.  MyChart is used to connect with patients for Virtual Visits (Telemedicine).  Patients are able to view lab/test results, encounter notes, upcoming appointments, etc.  Non-urgent messages can be sent to your provider as well.   To learn more about what you can do with MyChart, go to [email or web address].    Your next appointment:   1 month(s)  The format for your next appointment:   In Person  Provider:   Cherlynn Kaiser, MD or Almyra Deforest, PA

## 2021-05-27 ENCOUNTER — Other Ambulatory Visit: Payer: Self-pay

## 2021-05-27 ENCOUNTER — Ambulatory Visit (INDEPENDENT_AMBULATORY_CARE_PROVIDER_SITE_OTHER): Payer: Managed Care, Other (non HMO) | Admitting: Internal Medicine

## 2021-05-27 VITALS — BP 88/65 | HR 79 | Ht 62.0 in | Wt 126.4 lb

## 2021-05-27 DIAGNOSIS — I2584 Coronary atherosclerosis due to calcified coronary lesion: Secondary | ICD-10-CM

## 2021-05-27 DIAGNOSIS — R0602 Shortness of breath: Secondary | ICD-10-CM

## 2021-05-27 DIAGNOSIS — R55 Syncope and collapse: Secondary | ICD-10-CM | POA: Diagnosis not present

## 2021-05-27 DIAGNOSIS — I2510 Atherosclerotic heart disease of native coronary artery without angina pectoris: Secondary | ICD-10-CM | POA: Diagnosis not present

## 2021-05-27 DIAGNOSIS — E7800 Pure hypercholesterolemia, unspecified: Secondary | ICD-10-CM | POA: Diagnosis not present

## 2021-05-27 NOTE — Patient Instructions (Signed)
Medication Instructions:  Your Physician recommend you continue on your current medication as directed.    *If you need a refill on your cardiac medications before your next appointment, please call your pharmacy*  Lab Work: None ordered  Testing/Procedures: Your physician has requested that you have an echocardiogram. Echocardiography is a painless test that uses sound waves to create images of your heart. It provides your doctor with information about the size and shape of your heart and how well your heart's chambers and valves are working. This procedure takes approximately one hour. There are no restrictions for this procedure. [address]   Follow-Up: At [facility], you and your health needs are our priority.  As part of our continuing mission to provide you with exceptional heart care, we have created designated Provider Care Teams.  These Care Teams include your primary Cardiologist (physician) and Advanced Practice Providers (APPs -  Physician Assistants and Nurse Practitioners) who all work together to provide you with the care you need, when you need it.  We recommend signing up for the patient portal called "MyChart".  Sign up information is provided on this After Visit Summary.  MyChart is used to connect with patients for Virtual Visits (Telemedicine).  Patients are able to view lab/test results, encounter notes, upcoming appointments, etc.  Non-urgent messages can be sent to your provider as well.   To learn more about what you can do with MyChart, go to [email or web address].    Your next appointment:   1 month(s)  The format for your next appointment:   In Person  Provider:   Cherlynn Kaiser, MD or Almyra Deforest, PA

## 2021-05-31 ENCOUNTER — Encounter: Payer: Self-pay | Admitting: Internal Medicine

## 2021-06-07 ENCOUNTER — Encounter: Payer: Self-pay | Admitting: Dermatology

## 2021-06-07 NOTE — Progress Notes (Signed)
   Follow-Up Visit   Subjective  Amanda Salazar is a 60 y.o. female who presents for the following: Follow-up (Patient here today for follow up from treatment on the left temporal scalp. Per patient she's not sure if it healed well because she can't see the area. Per patient she has a lesion on her left arm x 2-3 months not healing. Per patient she has lesion under her breast that she just noticed this morning. ).  Recheck left temple, newer crust on left arm, new spot under breast Location:  Duration:  Quality:  Associated Signs/Symptoms: Modifying Factors:  Severity:  Timing: Context:   Objective  Well appearing patient in no apparent distress; mood and affect are within normal limits. Left Upper Arm - Anterior Pink hornlike 4 mm crust  Right Inframammary Fold Focal glazed erythema compatible with irritant dermatitis; no pustules or satellite lesions to suggest Candida  Left Temple No sign recurrence    A focused examination was performed including head, neck, arms, chest. Relevant physical exam findings are noted in the Assessment and Plan.   Assessment & Plan    AK (actinic keratosis) Left Upper Arm - Anterior  Destruction of lesion - Left Upper Arm - Anterior Complexity: simple   Destruction method: cryotherapy   Informed consent: discussed and consent obtained   Timeout:  patient name, date of birth, surgical site, and procedure verified Lesion destroyed using liquid nitrogen: Yes   Cryotherapy cycles:  3 Outcome: patient tolerated procedure well with no complications    Dermatitis Right Inframammary Fold  Over the counter triple paste AF  Personal history of skin cancer Left Temple  Check as needed change      I, Lavonna Monarch, MD, have reviewed all documentation for this visit.  The documentation on 06/07/21 for the exam, diagnosis, procedures, and orders are all accurate and complete.

## 2021-06-09 ENCOUNTER — Other Ambulatory Visit: Payer: Self-pay | Admitting: Internal Medicine

## 2021-06-09 ENCOUNTER — Other Ambulatory Visit: Payer: Self-pay | Admitting: Nurse Practitioner

## 2021-06-09 MED ORDER — NABUMETONE 500 MG PO TABS: ORAL_TABLET | ORAL | 1 refills | Status: DC

## 2021-06-11 ENCOUNTER — Ambulatory Visit (HOSPITAL_COMMUNITY): Payer: Managed Care, Other (non HMO) | Attending: Internal Medicine

## 2021-06-11 ENCOUNTER — Other Ambulatory Visit: Payer: Self-pay

## 2021-06-11 DIAGNOSIS — R0602 Shortness of breath: Secondary | ICD-10-CM | POA: Diagnosis not present

## 2021-06-11 DIAGNOSIS — R55 Syncope and collapse: Secondary | ICD-10-CM | POA: Diagnosis present

## 2021-06-11 LAB — ECHOCARDIOGRAM COMPLETE
Area-P 1/2: 3.42 cm2
S' Lateral: 1.85 cm

## 2021-06-14 ENCOUNTER — Ambulatory Visit (HOSPITAL_COMMUNITY): Payer: Managed Care, Other (non HMO)

## 2021-06-24 NOTE — Progress Notes (Signed)
Cardiology Office Note   Date:  06/25/2021   ID:  Amanda Salazar, DOB 10/01/1960, MRN [id number]  PCP:  [name], MD  Cardiologist:  Dr. Margaretann Loveless  CC: ollow UP    History of Present Illness: Amanda Salazar is a 60 y.o. female who presents for follow-up with history of ADD, basal cell carcinoma, and hemangioma who was to be seen in the office on 05/27/2021 after having an episode of altered consciousness and a heart murmur.  Apparently had been seen by PCP Su Grand, NP on 05/24/2021 and reported feeling acutely fatigued nauseated and dizzy prompting her to lie down.  EMS was called to that office and her symptoms had resolved prior to EMT arrival.  She did have a scheduled appointment on 05/27/2021 which she did not arrive for to have cardiac evaluation.  However, an echocardiogram was completed on 06/11/2021 which was normal with EF of 60 to 65%.  She comes today without new complaints. She continues to have significant anxiety about family members health issues.  She works under [employer] from home. She is doing well otherwise.  Past Medical History:  Diagnosis Date   ADD (attention deficit disorder)    Atypical nevus 01/11/1995   Medial Lower Abdomen - Slight   Basal cell carcinoma 09/07/2020   nod- left temporal scalp   BCC (basal cell carcinoma of skin) 04/30/2008   Right Neck   Blurred vision    Cataract    Mixed form OU   Hemangioma    on liver   Seasonal allergies     Past Surgical History:  Procedure Laterality Date   BREAST EXCISIONAL BIOPSY Bilateral    BREAST SURGERY     biopsy x 3   CESAREAN SECTION     x 4   EYE SURGERY       Current Outpatient Medications  Medication Sig Dispense Refill   Biotin 10 MG TABS Take by mouth.     Cholecalciferol 125 MCG (5000 UT) capsule Take 5,000 Units by mouth daily.     Cyanocobalamin (VITAMIN B 12 PO) Take by mouth daily.     cycloSPORINE (RESTASIS) 0.05 % ophthalmic emulsion Place 1 drop into both eyes 2 (two)  times daily.     EQ ALLERGY RELIEF, CETIRIZINE, 10 MG tablet TAKE 1 TABLET BY MOUTH ONCE DAILY FOR ALLERGIES 90 tablet 0   hydroquinone 4 % cream Apply topically 2 (two) times daily. 28.35 g 2   nabumetone (RELAFEN) 500 MG tablet Take 1 tablet 2 x /day with Food for Pain & Inflammation 180 tablet 1   Omega-3 Fatty Acids (FISH OIL) 1200 MG CAPS Take 1,200 mg by mouth daily.     OVER THE COUNTER MEDICATION Turmeric daily     rosuvastatin (CRESTOR) 5 MG tablet Take 5 mg by mouth daily.     tretinoin (RETIN-A) 0.05 % cream tretinoin 0.05 % topical cream  APPLY TO AFFECTED AREA AT BEDTIME     triamcinolone (NASACORT) 55 MCG/ACT AERO nasal inhaler SPRAY 2 SPRAYS NASALLY DAILY 50.7 each 3   No current facility-administered medications for this visit.    Allergies:   Iodinated diagnostic agents and Erythromycin    Social History:  The patient  reports that she has never smoked. She has never used smokeless tobacco. She reports current alcohol use. She reports that she does not use drugs.   Family History:  The patient's family history includes Hypertension in her mother.    ROS: All other systems are  reviewed and negative. Unless otherwise mentioned in H&P    PHYSICAL EXAM: VS:  BP 128/76   Pulse 69   Ht 5\' 2"  (1.575 m)   Wt 128 lb 3.2 oz (58.2 kg)   LMP 05/11/2012   SpO2 98%   BMI 23.45 kg/m  , BMI Body mass index is 23.45 kg/m. GEN: Well nourished, well developed, in no acute distress HEENT: normal Neck: no JVD, carotid bruits, or masses Cardiac: RRR; soft systolic murmurs, rubs, or gallops,no edema  Respiratory:  Clear to auscultation bilaterally, normal work of breathing GI: soft, nontender, nondistended, + BS MS: no deformity or atrophy Skin: warm and dry, no rash Neuro:  Strength and sensation are intactPsych: euthymic mood, full affect   EKG:  EKG is not ordered today.   Recent Labs: 03/23/2021: Magnesium 2.3 05/24/2021: ALT 14; BUN 11; Creat 0.65; Hemoglobin 14.0;  Platelets 251; Potassium 4.4; Sodium 142; TSH 1.99    Lipid Panel    Component Value Date/Time   CHOL 257 (H) 03/23/2021 1033   TRIG 99 03/23/2021 1033   HDL 67 03/23/2021 1033   CHOLHDL 3.8 03/23/2021 1033   VLDL 27 03/17/2017 1201   LDLCALC 168 (H) 03/23/2021 1033   LDLDIRECT 151.2 01/03/2008 1015      Wt Readings from Last 3 Encounters:  06/25/21 128 lb 3.2 oz (58.2 kg)  05/27/21 126 lb 6.4 oz (57.3 kg)  05/24/21 128 lb 12.8 oz (58.4 kg)      Other studies Reviewed: Echocardiogram Jun 19, 2021  1. Left ventricular ejection fraction, by estimation, is 60 to 65%. The  left ventricle has normal function. The left ventricle has no regional  wall motion abnormalities. Left ventricular diastolic parameters were  normal. The average left ventricular  global longitudinal strain is 21.4 %. The global longitudinal strain is  normal.   2. Right ventricular systolic function is normal. The right ventricular  size is normal. Tricuspid regurgitation signal is inadequate for assessing  PA pressure.   3. The mitral valve is normal in structure. No evidence of mitral valve  regurgitation.   4. The aortic valve was not well visualized. Aortic valve regurgitation  is not visualized. No aortic stenosis is present.   5. The inferior vena cava is normal in size with greater than 50%  respiratory variability, suggesting right atrial pressure of 3 mmHg.   ASSESSMENT AND PLAN:  Altered Consciousness:  No recurrence of these issues.  No changes in regimen  :Psychosocial Stressors; Offered referral to counselor. She does not have health insurance other that COBRA.  She wants to delay this until see has full coverage insurance    Current medicines are reviewed at length with the patient today.  I have spent 25  dedicated to the care of this patient on the date of this encounter to include pre-visit review of records, assessment, management and diagnostic testing,with shared decision  making.  Labs/ tests ordered today include: None  Phill Myron. West Pugh, ANP, AACC   06/25/2021 12:18 PM    [facility] [place] [address] Office [phone] Fax [phone]  Notice: This dictation was prepared with Dragon dictation along with smaller phrase technology. Any transcriptional errors that result from this process are unintentional and may not be corrected upon review.

## 2021-06-25 ENCOUNTER — Encounter: Payer: Self-pay | Admitting: Adult Health

## 2021-06-25 ENCOUNTER — Ambulatory Visit (INDEPENDENT_AMBULATORY_CARE_PROVIDER_SITE_OTHER): Payer: Managed Care, Other (non HMO) | Admitting: Adult Health

## 2021-06-25 ENCOUNTER — Other Ambulatory Visit: Payer: Self-pay

## 2021-06-25 VITALS — BP 128/76 | HR 69 | Ht 62.0 in | Wt 128.2 lb

## 2021-06-25 DIAGNOSIS — R404 Transient alteration of awareness: Secondary | ICD-10-CM

## 2021-06-25 DIAGNOSIS — F4321 Adjustment disorder with depressed mood: Secondary | ICD-10-CM | POA: Diagnosis not present

## 2021-06-25 NOTE — Patient Instructions (Signed)
Medication Instructions:  Your physician recommends that you continue on your current medications as directed. Please refer to the Current Medication list given to you today.  *If you need a refill on your cardiac medications before your next appointment, please call your pharmacy*  Lab Work: NONE ordered at this time of appointment   If you have labs (blood work) drawn today and your tests are completely normal, you will receive your results only by: [place] (if you have MyChart) OR A paper copy in the mail If you have any lab test that is abnormal or we need to change your treatment, we will call you to review the results.  Testing/Procedures: NONE ordered at this time of appointment   Follow-Up: At [facility] you and your health needs are our priority.  As part of our continuing mission to provide you with exceptional heart care, we have created designated Provider Care Teams.  These Care Teams include your primary Cardiologist (physician) and Advanced Practice Providers (APPs -  Physician Assistants and Nurse Practitioners) who all work together to provide you with the care you need, when you need it.  Your next appointment:   1 year(s)  The format for your next appointment:   In Person  Provider:   Elouise Munroe, MD    Other Instructions

## 2021-07-08 ENCOUNTER — Other Ambulatory Visit: Payer: Self-pay

## 2021-07-08 MED ORDER — NABUMETONE 500 MG PO TABS: ORAL_TABLET | ORAL | 1 refills | Status: DC

## 2021-09-14 ENCOUNTER — Other Ambulatory Visit: Payer: Self-pay | Admitting: Internal Medicine

## 2021-09-22 ENCOUNTER — Ambulatory Visit (INDEPENDENT_AMBULATORY_CARE_PROVIDER_SITE_OTHER): Payer: Managed Care, Other (non HMO) | Admitting: Internal Medicine

## 2021-09-22 ENCOUNTER — Encounter: Payer: Self-pay | Admitting: Internal Medicine

## 2021-09-22 ENCOUNTER — Other Ambulatory Visit: Payer: Self-pay

## 2021-09-22 VITALS — BP 128/78 | HR 91 | Temp 97.8°F | Resp 17 | Ht 62.0 in | Wt 128.4 lb

## 2021-09-22 DIAGNOSIS — F988 Other specified behavioral and emotional disorders with onset usually occurring in childhood and adolescence: Secondary | ICD-10-CM

## 2021-09-22 DIAGNOSIS — H8113 Benign paroxysmal vertigo, bilateral: Secondary | ICD-10-CM

## 2021-09-22 DIAGNOSIS — G4700 Insomnia, unspecified: Secondary | ICD-10-CM

## 2021-09-22 DIAGNOSIS — F411 Generalized anxiety disorder: Secondary | ICD-10-CM | POA: Diagnosis not present

## 2021-09-22 MED ORDER — PHENTERMINE HCL 37.5 MG PO TABS
ORAL_TABLET | ORAL | 0 refills | Status: DC
Start: 2021-09-22 — End: 2022-03-23

## 2021-09-22 MED ORDER — MECLIZINE HCL 25 MG PO TABS: ORAL_TABLET | ORAL | 0 refills | Status: DC

## 2021-09-22 MED ORDER — TOPIRAMATE 50 MG PO TABS: ORAL_TABLET | ORAL | 0 refills | Status: DC

## 2021-09-22 NOTE — Progress Notes (Signed)
Future Appointments  Date Time Provider Department  09/22/2021  3:30 PM [name], MD [facility]  03/23/2022  9:00 AM Magda Bernheim, NP [facility]  05/13/2022  2:30 PM [name], MD [facility]    History of Present Illness:     This very nice 61 yo MWF who last Oct 2022 had a near presyncopal episode  and was seen by Cardiology and echocardiogram was negative. Today she recounts an episode in October with symptoms consistent with classic benign positional vertigo. She relates a similar episode 5 days ago with classic "spinning" sensation & nausea associated with positional head changes. She also relates her long standing sx's of difficulty with concentration, focusing & tendency for distractibility Other issues are ongoing  difficulty with insomnia.   Medications    rosuvastatin 5 MG tablet, Take 5 mg by mouth daily.   CETIRIZINE, 10 MG tablet, TAKE 1 TABLET DAILY FOR ALLERGIES   NASACORT  nasal inhaler, SPRAY 2 SPRAYS NASALLY DAILY   nabumetone 500 MG tablet, Take 1 tablet 2 x /day with Food for Pain & Inflammation   Cyanocobalamin (VITAMIN B 12 PO), Take by mouth daily.   Biotin 10 MG TABS, Take daily    Vitamin D  5000 u, Take daily.   RESTASIS) 0.05 % ophth  emulsion, Place 1 drop into both eyes 2 times daily.   hydroquinone 4 % cream, Apply topically 2  times daily.   Omega-3 Fatty Acids (FISH OIL) 1200 MG CAPS, Take 1,200 mg by mouth daily.   OVER THE COUNTER MEDICATION, Turmeric daily   tretinoin (RETIN-A) 0.05 % cream, tretinoin 0.05 % topical cream  APPLY TO AFFECTED AREA AT BEDTIME  Problem list She has Hyperlipidemia; Allergic rhinitis; GERD; Dense breast tissue; Basal cell carcinoma; Generalized anxiety disorder; and ADD (attention deficit disorder) on their problem list.   Observations/Objective:  BP 128/78    Pulse 91    Temp 97.8 F (36.6 C)    Resp 17    Ht 5\' 2"  (1.575 m)    Wt 128 lb 6.4 oz (58.2 kg)    LMP 05/11/2012    SpO2 96%    BMI 23.48 kg/m    HEENT - WNL. Neck - supple.  Chest - Clear equal BS. Cor - Nl HS. RRR w/o sig MGR. PP 1(+). No edema. MS- FROM w/o deformities.  Gait Nl. Neuro -  Nl w/o focal abnormalities.  Assessment and Plan:  1. Benign paroxysmal positional vertigo due to bilateral vestibular disorder  - meclizine (ANTIVERT) 25 MG tablet;  Take 1/2 to 1 tablet 2 to 3 x /day as needed for Dizziness / Vertigo   Dispense: 90 tablet  2. Attention deficit disorder  - phentermine (ADIPEX-P) 37.5 MG tablet;  Take 1/2 to 1 tablet every Morning for ADD, Focus & Concentration   Dispense: 90 tablet  3. Generalized anxiety disorder   4. Insomnia  - topiramate (TOPAMAX) 50 MG tablet;  Take 1 to 2  tablets  at Bedtime   Dispense: 180 tablet; Refill: 0   Follow Up Instructions:        I discussed the assessment and treatment plan with the patient. The patient was provided an opportunity to ask questions and all were answered. The patient agreed with the plan and demonstrated an understanding of the instructions.       The patient was advised to call back or seek an in-person evaluation if the symptoms worsen or if the condition fails to improve as  anticipated.    Kirtland Bouchard, MD

## 2021-09-29 NOTE — Progress Notes (Signed)
[facility] Note  09/30/2021     CHIEF COMPLAINT Patient presents for Retina Follow Up    HISTORY OF PRESENT ILLNESS: Amanda Salazar is a 61 y.o. female who presents to the clinic today for:   HPI     Retina Follow Up   Patient presents with  Other.  In both eyes.  This started 2 weeks ago.  I, the attending physician,  performed the HPI with the patient and updated documentation appropriately.        Comments   Patient here for 5 months retina follow up for increase in floaters. Patient states started 2 weeks ago having blurred vision and floaters. No eye pain. Started taking 2 new medicines. Has had RK OU years ago.       Last edited by [name], MD on 09/30/2021  8:46 AM.     Pt called in a couple days ago and wanted to be seen for increase of floaters OU, she states this started 2 weeks ago, she tried ignoring it, but they never went away, she states she had an incident of vertigo and went to the dr and was given phentermine and Topamax for focus and sleep, she started these on Monday, pt is going to go to [facility] to get glasses, she also sees Dr. Lucita Ferrara for dry eyes  Referring physician: Vevelyn Royals, MD [address] [place] [address],  [place] [id number]  HISTORICAL INFORMATION:   Selected notes from the [facility] Referred by Dr. Zigmund Daniel for possible RD LEE- 04.27.18 (JDM) [BCVA OD: 20/20-1 OS: 20/20-1]   CURRENT MEDICATIONS: Current Outpatient Medications (Ophthalmic Drugs)  Medication Sig   cycloSPORINE (RESTASIS) 0.05 % ophthalmic emulsion Place 1 drop into both eyes 2 (two) times daily.   No current facility-administered medications for this visit. (Ophthalmic Drugs)   Current Outpatient Medications (Other)  Medication Sig   Biotin 10 MG TABS Take by mouth.   Cholecalciferol 125 MCG (5000 UT) capsule Take 5,000 Units by mouth daily.   Cyanocobalamin (VITAMIN B 12 PO) Take by mouth daily.   EQ  ALLERGY RELIEF, CETIRIZINE, 10 MG tablet TAKE 1 TABLET BY MOUTH ONCE DAILY FOR ALLERGIES   hydroquinone 4 % cream Apply topically 2 (two) times daily.   nabumetone (RELAFEN) 500 MG tablet Take 1 tablet 2 x /day with Food for Pain & Inflammation   Omega-3 Fatty Acids (FISH OIL) 1200 MG CAPS Take 1,200 mg by mouth daily.   OVER THE COUNTER MEDICATION Turmeric daily   phentermine (ADIPEX-P) 37.5 MG tablet Take 1/2 to 1 tablet every Morning for ADD, Focus & Concentration   rosuvastatin (CRESTOR) 5 MG tablet Take 5 mg by mouth daily.   topiramate (TOPAMAX) 50 MG tablet Take 1 to 2  tablets  at Bedtime   tretinoin (RETIN-A) 0.05 % cream tretinoin 0.05 % topical cream  APPLY TO AFFECTED AREA AT BEDTIME   triamcinolone (NASACORT) 55 MCG/ACT AERO nasal inhaler SPRAY 2 SPRAYS NASALLY DAILY   meclizine (ANTIVERT) 25 MG tablet Take 1/2 to 1 tablet 2 to 3 x /day as needed for Dizziness / Vertigo (Patient not taking: Reported on 09/30/2021)   No current facility-administered medications for this visit. (Other)   REVIEW OF SYSTEMS: ROS   Positive for: Gastrointestinal, Eyes, Psychiatric, Heme/Lymph Negative for: Constitutional, Neurological, Skin, Genitourinary, Musculoskeletal, HENT, Endocrine, Cardiovascular, Respiratory, Allergic/Imm Last edited by Theodore Demark, COA on 09/30/2021  8:32 AM.  ALLERGIES Allergies  Allergen Reactions   Iodinated Contrast Media Anaphylaxis   Erythromycin Nausea And Vomiting   PAST MEDICAL HISTORY Past Medical History:  Diagnosis Date   ADD (attention deficit disorder)    Atypical nevus 01/11/1995   Medial Lower Abdomen - Slight   Basal cell carcinoma 09/07/2020   nod- left temporal scalp   BCC (basal cell carcinoma of skin) 04/30/2008   Right Neck   Blurred vision    Cataract    Mixed form OU   Hemangioma    on liver   Seasonal allergies    Past Surgical History:  Procedure Laterality Date   BREAST EXCISIONAL BIOPSY Bilateral    BREAST SURGERY      biopsy x 3   CESAREAN SECTION     x 4   EYE SURGERY     FAMILY HISTORY Family History  Problem Relation Age of Onset   Hypertension Mother    SOCIAL HISTORY Social History   Tobacco Use   Smoking status: Never   Smokeless tobacco: Never  Vaping Use   Vaping Use: Never used  Substance Use Topics   Alcohol use: Yes    Comment: glass wine most nights   Drug use: No       OPHTHALMIC EXAM:  Base Eye Exam     Visual Acuity (Snellen - Linear)       Right Left   Dist [place] 20/60 -2 20/40 -2   Dist ph  20/40 +2 20/20 -2  Didn't bring glasses.        Tonometry (Tonopen, 8:28 AM)       Right Left   Pressure 16 16         Pupils       Dark Light Shape React APD   Right 3 2 Round Brisk None   Left 3 2 Round Brisk None         Visual Fields (Counting fingers)       Left Right    Full Full         Extraocular Movement       Right Left    Full, Ortho Full, Ortho         Neuro/Psych     Oriented x3: Yes   Mood/Affect: Normal         Dilation     Both eyes: 1.0% Mydriacyl, 2.5% Phenylephrine @ 8:27 AM           Slit Lamp and Fundus Exam     Slit Lamp Exam       Right Left   Lids/Lashes Mild Meibomian gland dysfunction, Dermatochalasis - upper lid Mild Meibomian gland dysfunction, Dermatochalasis - upper lid   Conjunctiva/Sclera White and quiet White and quiet   Cornea Arcus, 4 RK scars at 1200, 0300, 0600, and 0900, 1+ fine Punctate epithelial erosions Arcus, trace Punctate epithelial erosions, 4 RK scars at 1100, 0200, 0500, 0800   Anterior Chamber Deep and quiet Deep and quiet   Iris Round and dilated Round and dilated   Lens 2+ Nuclear sclerosis, 2-3+ Cortical cataract 2+ Nuclear sclerosis, 2-3+ Cortical cataract   Anterior Vitreous Mild Vitreous syneresis, Posterior vitreous detachment, Weiss ring, no pigment, vitreous condensations Vitreous syneresis, Posterior vitreous detachment, Weiss ring, vitreous condensations          Fundus Exam       Right Left   Disc Pink and Sharp, Compact Pink and Sharp, Compact   C/D Ratio 0.3 0.4   Macula  Flat, good foveal reflex, Nasal Epiretinal membrane with striae and mild central thickening - stable from prior, No heme Flat, Good foveal reflex, mild Retinal pigment epithelial mottling, No heme or edema   Vessels attenuated, mild tortuosity, mild Copper wiring mild attenuation, mild tortuosity   Periphery Attached, small hole with good laser surrounding at 1100, No RT/RD, No heme Attached, small horseshoe tear at 0900 - good laser changes surrounding, No new RT/RD            IMAGING AND PROCEDURES  Imaging and Procedures for 11/17/17  OCT, Retina - OU - Both Eyes       Right Eye Quality was good. Central Foveal Thickness: 412. Progression has been stable. Findings include macular pucker, epiretinal membrane, abnormal foveal contour, no IRF, no SRF (Focal ERM nasal macula and fovea with pucker and PRF, thickening of nasal fovea).   Left Eye Quality was good. Central Foveal Thickness: 241. Progression has been stable. Findings include normal foveal contour, no IRF, no SRF.   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 CME - cystoid macular edema. PED - pigment epithelial detachment. IRF - intraretinal fluid. SRF - subretinal fluid. EZ - ellipsoid zone. ERM - epiretinal membrane. ORA - outer retinal atrophy. ORT - outer retinal tubulation. SRHM - subretinal hyper-reflective material              ASSESSMENT/PLAN:    ICD-10-CM   1. Posterior vitreous detachment of both eyes  H43.813     2. Retinal tear of left eye  H33.312     3. History of repair of retinal tear by laser photocoagulation  Z98.890     4. 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 Epiretinal membrane (ERM) of right eye  H35.371 OCT, Retina - OU - Both Eyes    6. Combined forms of age-related cataract of both eyes  H25.813      **pt presents acutely for increase in floaters and blurry vision x2 weeks - pt started taking phentermine and Topamax on Monday, 02.20.23  - exam shows stable PVD and vit condensations OU  - no new RT/RD  - pt plans on following up with Dr. Lucita Ferrara for cornea and cataract f/u, and Dr. Sherral Hammers at [facility] for updated glasses  1,2. PVD with retinal tear, OS    - small horseshoe tear located at 0900 -- no SRF  - S/P laser retinopexy OS (03.11.19) - good laser changes  - no new tears or detachment on repeat 360 peripheral exam  - f/u as scheduled  3. History of retinal tear OD  - 1100 oclock position  - s/p laser retinopexy with Dr. Zigmund Daniel  - stable  - continue to monitor  4,5. Epiretinal membrane, OD  - BCVA 20/40 from  20/25-1  - OCT shows stable nasal ERM with pucker, fibrosis and progression of central retinal thickening  - no indication for surgery at this time  - continue to monitor  - f/u 1 year, sooner prn  6. Combined form age-related cataract OU-   - The symptoms of cataract, surgical options, and treatments and risks were discussed with patient.  - discussed diagnosis and progression  - continue to monitor for now  Ophthalmic Meds Ordered this visit:  No orders of the defined types were placed in this encounter.    Return for f/u as scheduled.  There are no Patient Instructions on file for this visit.  This document serves as a record of services personally performed by Gardiner Sleeper, MD, PhD. It was created on their behalf by San Jetty. Owens Shark, OA an ophthalmic technician. The creation of this record is the provider's dictation and/or activities during the visit.    Electronically signed by: San Jetty. Owens Shark, [place] 02.22.2023 1:46 AM  Gardiner Sleeper, M.D., Ph.D. Diseases & Surgery of the Retina and  Vitreous [facility]  I have reviewed the above documentation for accuracy and completeness, and I agree with the above. Gardiner Sleeper, M.D., Ph.D. 10/03/21 1:46 AM   Abbreviations: M myopia (nearsighted); A astigmatism; H hyperopia (farsighted); P presbyopia; Mrx spectacle prescription;  CTL contact lenses; OD right eye; OS left eye; OU both eyes  XT exotropia; ET esotropia; PEK punctate epithelial keratitis; PEE punctate epithelial erosions; DES dry eye syndrome; MGD meibomian gland dysfunction; ATs artificial tears; PFAT's preservative free artificial tears; [place] nuclear sclerotic cataract; PSC posterior subcapsular cataract; ERM epi-retinal membrane; PVD posterior vitreous detachment; RD retinal detachment; DM diabetes mellitus; DR diabetic retinopathy; NPDR non-proliferative diabetic retinopathy; PDR proliferative diabetic retinopathy; CSME clinically significant macular edema; DME diabetic macular edema; dbh dot blot hemorrhages; CWS cotton wool spot; POAG primary open angle glaucoma; C/D cup-to-disc ratio; HVF humphrey visual field; GVF goldmann visual field; OCT optical coherence tomography; IOP intraocular pressure; BRVO Branch retinal vein occlusion; CRVO central retinal vein occlusion; CRAO central retinal artery occlusion; BRAO branch retinal artery occlusion; RT retinal tear; SB scleral buckle; PPV pars plana vitrectomy; VH Vitreous hemorrhage; PRP panretinal laser photocoagulation; IVK intravitreal kenalog; VMT vitreomacular traction; MH Macular hole;  NVD neovascularization of the disc; NVE neovascularization elsewhere; AREDS age related eye disease study; ARMD age related macular degeneration; POAG primary open angle glaucoma; EBMD epithelial/anterior basement membrane dystrophy; ACIOL anterior chamber intraocular lens; IOL intraocular lens; PCIOL posterior chamber intraocular lens; Phaco/IOL phacoemulsification with intraocular lens placement; [place] photorefractive  keratectomy; LASIK laser assisted in situ keratomileusis; HTN hypertension; DM diabetes mellitus; COPD chronic obstructive pulmonary disease

## 2021-09-30 ENCOUNTER — Ambulatory Visit (INDEPENDENT_AMBULATORY_CARE_PROVIDER_SITE_OTHER): Payer: Managed Care, Other (non HMO) | Admitting: Ophthalmology

## 2021-09-30 ENCOUNTER — Encounter (INDEPENDENT_AMBULATORY_CARE_PROVIDER_SITE_OTHER): Payer: Self-pay | Admitting: Ophthalmology

## 2021-09-30 ENCOUNTER — Other Ambulatory Visit: Payer: Self-pay

## 2021-09-30 DIAGNOSIS — H25813 Combined forms of age-related cataract, bilateral: Secondary | ICD-10-CM

## 2021-09-30 DIAGNOSIS — Z9889 Other specified postprocedural states: Secondary | ICD-10-CM

## 2021-09-30 DIAGNOSIS — H43813 Vitreous degeneration, bilateral: Secondary | ICD-10-CM | POA: Diagnosis not present

## 2021-09-30 DIAGNOSIS — H35371 Puckering of macula, right eye: Secondary | ICD-10-CM

## 2021-09-30 DIAGNOSIS — H35341 Macular cyst, hole, or pseudohole, right eye: Secondary | ICD-10-CM | POA: Diagnosis not present

## 2021-09-30 DIAGNOSIS — H33312 Horseshoe tear of retina without detachment, left eye: Secondary | ICD-10-CM | POA: Diagnosis not present

## 2021-11-05 ENCOUNTER — Other Ambulatory Visit: Payer: Self-pay | Admitting: Nurse Practitioner

## 2021-11-05 ENCOUNTER — Other Ambulatory Visit: Payer: Self-pay | Admitting: Internal Medicine

## 2021-11-05 DIAGNOSIS — E2839 Other primary ovarian failure: Secondary | ICD-10-CM

## 2021-11-05 DIAGNOSIS — Z780 Asymptomatic menopausal state: Secondary | ICD-10-CM

## 2021-11-05 DIAGNOSIS — Z1231 Encounter for screening mammogram for malignant neoplasm of breast: Secondary | ICD-10-CM

## 2021-11-08 ENCOUNTER — Other Ambulatory Visit: Payer: Self-pay | Admitting: Nurse Practitioner

## 2021-11-08 DIAGNOSIS — Z780 Asymptomatic menopausal state: Secondary | ICD-10-CM

## 2021-11-08 DIAGNOSIS — E2839 Other primary ovarian failure: Secondary | ICD-10-CM

## 2021-11-17 ENCOUNTER — Ambulatory Visit
Admission: RE | Admit: 2021-11-17 | Discharge: 2021-11-17 | Disposition: A | Discharge status: Home / Self Care | Payer: Managed Care, Other (non HMO) | Source: Ambulatory Visit | Attending: Internal Medicine | Admitting: Internal Medicine

## 2021-11-17 DIAGNOSIS — Z1231 Encounter for screening mammogram for malignant neoplasm of breast: Secondary | ICD-10-CM

## 2021-11-23 ENCOUNTER — Other Ambulatory Visit: Payer: Self-pay | Admitting: Internal Medicine

## 2021-11-23 ENCOUNTER — Encounter: Payer: Self-pay | Admitting: Nurse Practitioner

## 2021-12-14 ENCOUNTER — Other Ambulatory Visit: Payer: Self-pay | Admitting: Internal Medicine

## 2021-12-14 DIAGNOSIS — G4700 Insomnia, unspecified: Secondary | ICD-10-CM

## 2021-12-22 ENCOUNTER — Other Ambulatory Visit: Payer: Self-pay | Admitting: Dermatology

## 2022-01-19 ENCOUNTER — Ambulatory Visit (INDEPENDENT_AMBULATORY_CARE_PROVIDER_SITE_OTHER): Payer: Managed Care, Other (non HMO) | Admitting: Dermatology

## 2022-01-19 ENCOUNTER — Encounter: Payer: Self-pay | Admitting: Dermatology

## 2022-01-19 DIAGNOSIS — Z1283 Encounter for screening for malignant neoplasm of skin: Secondary | ICD-10-CM | POA: Diagnosis not present

## 2022-01-19 DIAGNOSIS — Z85828 Personal history of other malignant neoplasm of skin: Secondary | ICD-10-CM

## 2022-01-19 DIAGNOSIS — D1801 Hemangioma of skin and subcutaneous tissue: Secondary | ICD-10-CM

## 2022-01-19 DIAGNOSIS — Z86018 Personal history of other benign neoplasm: Secondary | ICD-10-CM

## 2022-01-19 DIAGNOSIS — L821 Other seborrheic keratosis: Secondary | ICD-10-CM

## 2022-02-07 ENCOUNTER — Encounter: Payer: Self-pay | Admitting: Neurology

## 2022-02-12 ENCOUNTER — Encounter: Payer: Self-pay | Admitting: Dermatology

## 2022-02-12 NOTE — Progress Notes (Signed)
   Follow-Up Visit   Subjective  Amanda Salazar is a 61 y.o. female who presents for the following: Annual Exam (Yearly skin check frontal scalp she feels a lesion that seems new. Patient has personal history of bcc and atypia).  General skin check, possible new spot on front scalp Location:  Duration:  Quality:  Associated Signs/Symptoms: Modifying Factors:  Severity:  Timing: Context:   Objective  Well appearing patient in no apparent distress; mood and affect are within normal limits. Full body skin exam.  No atypical pigmented lesions (all checked with dermoscopy).  No new or recurrent nonmelanoma skin cancer.  Right Inguinal Area In addition to the textured 6 mm flattopped brown papule of the right groin there is a similar tan spot on her frontal scalp.  Right Lower Back Multiple 1 to 3 mm smooth red dermal papules    A full examination was performed including scalp, head, eyes, ears, nose, lips, neck, chest, axillae, abdomen, back, buttocks, bilateral upper extremities, bilateral lower extremities, hands, feet, fingers, toes, fingernails, and toenails. All findings within normal limits unless otherwise noted below.  Areas beneath undergarments not fully examined.   Assessment & Plan    Skin exam for malignant neoplasm  Yearly skin exam.  Seborrheic keratosis Right Inguinal Area  Recheck as needed change  Hemangioma of skin Right Lower Back  No intervention necessary      I, Lavonna Monarch, MD, have reviewed all documentation for this visit.  The documentation on 02/12/22 for the exam, diagnosis, procedures, and orders are all accurate and complete.

## 2022-02-22 NOTE — Progress Notes (Deleted)
NEUROLOGY CONSULTATION NOTE  Amanda Salazar MRN: [id number] DOB: 1961/04/08  Referring provider: [name], MD Primary care provider: [name], MD  Reason for consult:  syncope  Assessment/Plan:   ***   Subjective:  ***   Longstanding history of dizziness.  ***.  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  Has had episodes of pre-syncope with shortness of breath.  Evaluated by cardiology.  EKG was normal.  2D echo from 06/11/2021 was unremarkable with EF 60-65%.  No recurrence.  She has history of significant anxiety.    PAST MEDICAL HISTORY: Past Medical History:  Diagnosis Date   ADD (attention deficit disorder)    Atypical nevus 01/11/1995   Medial Lower Abdomen - Slight   Basal cell carcinoma 09/07/2020   nod- left temporal scalp   BCC (basal cell carcinoma of skin) 04/30/2008   Right Neck   Blurred vision    Cataract    Mixed form OU   Hemangioma    on liver   Seasonal allergies     PAST SURGICAL HISTORY: Past Surgical History:  Procedure Laterality Date   BREAST EXCISIONAL BIOPSY Bilateral    BREAST SURGERY     biopsy x 3   CESAREAN SECTION     x 4   EYE SURGERY      MEDICATIONS: Current Outpatient Medications on File Prior to Visit  Medication Sig Dispense Refill   Biotin 10 MG TABS Take by mouth.     Cholecalciferol 125 MCG (5000 UT) capsule Take 5,000 Units by mouth daily.     Cyanocobalamin (VITAMIN B 12 PO) Take by mouth daily.     cycloSPORINE (RESTASIS) 0.05 % ophthalmic emulsion Place 1 drop into both eyes 2 (two) times daily.     EQ ALLERGY RELIEF, CETIRIZINE, 10 MG tablet TAKE 1 TABLET BY MOUTH ONCE DAILY FOR ALLERGIES 90 tablet 1   hydroquinone 4 % cream APPLY TOPICALLY TWICE A DAY 28.35 g 3   nabumetone (RELAFEN) 500 MG tablet Take 1 tablet 2 x /day with Food for Pain & Inflammation 180 tablet 1   Omega-3 Fatty Acids (FISH OIL) 1200 MG CAPS Take 1,200 mg by mouth daily.     OVER THE COUNTER MEDICATION Turmeric daily      phentermine (ADIPEX-P) 37.5 MG tablet Take 1/2 to 1 tablet every Morning for ADD, Focus & Concentration (Patient not taking: Reported on 01/19/2022) 90 tablet 0   rosuvastatin (CRESTOR) 5 MG tablet Take 5 mg by mouth daily.     topiramate (TOPAMAX) 50 MG tablet TAKE 1 TO 2 TABLETS BY MOUTH EVERY DAY AT BEDTIME (Patient not taking: Reported on 01/19/2022) 180 tablet 0   tretinoin (RETIN-A) 0.05 % cream APPLY TO AFFECTED AREA EVERY DAY AT BEDTIME 45 g 5   triamcinolone (NASACORT) 55 MCG/ACT AERO nasal inhaler SPRAY 2 SPRAYS NASALLY DAILY 50.7 each 3   No current facility-administered medications on file prior to visit.    ALLERGIES: Allergies  Allergen Reactions   Iodinated Contrast Media Anaphylaxis   Erythromycin Nausea And Vomiting    FAMILY HISTORY: Family History  Problem Relation Age of Onset   Hypertension Mother     Objective:  *** General: No acute distress.  Patient appears well-groomed.   Head:  Normocephalic/atraumatic Eyes:  fundi examined but not visualized Neck: supple, no paraspinal tenderness, full range of motion Back: No paraspinal tenderness Heart: regular rate and rhythm Lungs: Clear to auscultation bilaterally. Vascular: No carotid bruits. Neurological Exam: Mental status: alert and  oriented to person, place, and time, speech fluent and not dysarthric, language intact. Cranial nerves: CN I: not tested CN II: pupils equal, round and reactive to light, visual fields intact CN III, IV, VI:  full range of motion, no nystagmus, no ptosis CN V: facial sensation intact. CN VII: upper and lower face symmetric CN VIII: hearing intact CN IX, X: gag intact, uvula midline CN XI: sternocleidomastoid and trapezius muscles intact CN XII: tongue midline Bulk & Tone: normal, no fasciculations. Motor:  muscle strength 5/5 throughout Sensation:  Pinprick, temperature and vibratory sensation intact. Deep Tendon Reflexes:  2+ throughout,  toes downgoing.   Finger to nose  testing:  Without dysmetria.   Heel to shin:  Without dysmetria.   Gait:  Normal station and stride.  Romberg negative.    Thank you for allowing me to take part in the care of this patient.  Metta Clines, DO  CC: [name], MD

## 2022-02-23 ENCOUNTER — Ambulatory Visit: Payer: Managed Care, Other (non HMO) | Admitting: Neurology

## 2022-03-15 NOTE — Progress Notes (Signed)
Complete Physical  Assessment and Plan: Amanda Salazar was seen today for annual exam.  Diagnoses and all orders for this visit:  Encounter for general adult medical examination with abnormal findings       - EKG       - Due annually DEXA is scheduled next month  Hyperlipidemia, unspecified hyperlipidemia type -     COMPLETE METABOLIC PANEL WITH GFR -     Lipid panel -  Continue diet and exercise and Crestor 5 mg   Attention deficit disorder, unspecified hyperactivity presence       - Currently not on medication, controlled with behavior modification  Generalized anxiety disorder  Currently controlled with diet, exercise and behavior modification  BMI 21 Continue diet and exercise  Gastroesophageal reflux disease, unspecified whether esophagitis present -     Magnesium - Denies current symptoms and is not on medication.  Will continue to monitor  Basal cell carcinoma (BCC), unspecified site       -  Follow with Dermatology  Vitamin D deficiency -     VITAMIN D 25 Hydroxy (Vit-D Deficiency, Fractures)  Medication management -     CBC with Differential/Platelet -     COMPLETE METABOLIC PANEL WITH GFR -     Lipid panel -     TSH -     Hemoglobin A1c -     VITAMIN D 25 Hydroxy (Vit-D Deficiency, Fractures) -     Magnesium -     EKG 12-Lead -     Urinalysis, Routine w reflex microscopic -     Microalbumin / creatinine urine ratio   Screening for hematuria or proteinuria -     Urinalysis, Routine w reflex microscopic -     Microalbumin / creatinine urine ratio  Screening for diabetes mellitus -     Hemoglobin A1c  Screening for thyroid disorder -     TSH  Screening for ischemic heart disease -     EKG 12-Lead  Vertigo - Meclizine '25mg'$  at bedtime - Has neurology appointment scheduled   Discussed med's effects and SE's. Screening labs and tests as requested with regular follow-up as recommended.  HPI 61 y.o. female  presents for a complete physical.    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 This is causing stress- contemplating retiring.   She is having issues with spinning when she is lying down, worse when she rolls over.  Was previously given Meclizine but did not get it filled   BMI is Body mass index is 21.24 kg/m., she is working on diet and exercise. She was using Topamax and Phentermine for weight loss.  Both the meds were causing visual issues- dry eyes. No longer taking either medication.  Wt Readings from Last 3 Encounters:  03/23/22 115 lb 3.2 oz (52.3 kg)  09/22/21 128 lb 6.4 oz (58.2 kg)  06/25/21 128 lb 3.2 oz (58.2 kg)    Her blood pressure has been controlled at home, today their BP is BP: 98/66  BP Readings from Last 3 Encounters:  03/23/22 98/66  09/22/21 128/78  06/25/21 128/76    She does not workout. She denies chest pain, shortness of breath, dizziness.   She is on cholesterol medication, crestor 5 mg was started after last visit and denies myalgias. Her cholesterol is not at goal. The cholesterol last visit was:   She never started cholesterol medication because she was afraid of brain fog  Lab Results  Component Value Date   CHOL 257 (H) 03/23/2021   HDL 67 03/23/2021   LDLCALC 168 (H) 03/23/2021   LDLDIRECT 151.2 01/03/2008   TRIG 99 03/23/2021   CHOLHDL 3.8 03/23/2021   Last A1C in the office was:  Lab Results  Component Value Date   HGBA1C 4.8 05/24/2021   Patient is on Vitamin D supplement.  Lab Results  Component Value Date   VD25OH 31 03/23/2021      Current Medications:    Current Outpatient Medications (Cardiovascular):    rosuvastatin (CRESTOR) 5 MG tablet, Take 5 mg by mouth daily.  Current Outpatient Medications (Respiratory):    EQ ALLERGY RELIEF, CETIRIZINE, 10 MG tablet, TAKE 1 TABLET BY MOUTH ONCE DAILY FOR ALLERGIES   triamcinolone (NASACORT) 55 MCG/ACT AERO nasal inhaler, SPRAY 2 SPRAYS NASALLY DAILY  Current Outpatient  Medications (Analgesics):    nabumetone (RELAFEN) 500 MG tablet, Take 1 tablet 2 x /day with Food for Pain & Inflammation (Patient not taking: Reported on 03/23/2022)  Current Outpatient Medications (Hematological):    Cyanocobalamin (VITAMIN B 12 PO), Take by mouth daily.  Current Outpatient Medications (Other):    Biotin 10 MG TABS, Take by mouth.   Cholecalciferol 125 MCG (5000 UT) capsule, Take 5,000 Units by mouth daily.   cycloSPORINE (RESTASIS) 0.05 % ophthalmic emulsion, Place 1 drop into both eyes 2 (two) times daily.   hydroquinone 4 % cream, APPLY TOPICALLY TWICE A DAY   OVER THE COUNTER MEDICATION, Turmeric daily   OVER THE COUNTER MEDICATION, Nutrafol for hair loss   tretinoin (RETIN-A) 0.05 % cream, APPLY TO AFFECTED AREA EVERY DAY AT BEDTIME   Omega-3 Fatty Acids (FISH OIL) 1200 MG CAPS, Take 1,200 mg by mouth daily. (Patient not taking: Reported on 03/23/2022)   phentermine (ADIPEX-P) 37.5 MG tablet, Take 1/2 to 1 tablet every Morning for ADD, Focus & Concentration (Patient not taking: Reported on 01/19/2022)   topiramate (TOPAMAX) 50 MG tablet, TAKE 1 TO 2 TABLETS BY MOUTH EVERY DAY AT BEDTIME (Patient not taking: Reported on 01/19/2022)  Health Maintenance:   Immunization History  Administered Date(s) Administered   Influenza Inj Mdck Quad Pf 09/29/2016   PFIZER(Purple Top)SARS-COV-2 Vaccination 11/07/2019, 12/02/2019   Tdap 04/16/2012   Tetanus: 2013 Pneumovax: N/A Prevnar 13: due age 89 Flu vaccine: get flu shot Shingrix: due at 68 , discussed  Pap: 2017 neg HPV, Due today MGM: 11/2021 DEXA: Scheduled for next month Colonoscopy:04/2021 Ct head 05/2012 EGD:2009  Medical History:  Past Medical History:  Diagnosis Date   ADD (attention deficit disorder)    Atypical nevus 01/11/1995   Medial Lower Abdomen - Slight   Basal cell carcinoma 09/07/2020   nod- left temporal scalp   BCC (basal cell carcinoma of skin) 04/30/2008   Right Neck   Blurred vision     Cataract    Mixed form OU   Hemangioma    on liver   Seasonal allergies    Allergies Allergies  Allergen Reactions   Iodinated Contrast Media Anaphylaxis   Erythromycin Nausea And Vomiting    SURGICAL HISTORY She  has a past surgical history that includes Breast surgery; Cesarean section; Eye surgery; and Breast excisional biopsy (Bilateral).   FAMILY HISTORY Her family history includes Hypertension in her mother.   SOCIAL HISTORY She  reports that she has never smoked. She has never used smokeless tobacco. She reports current alcohol use. She reports that she does not use drugs.  Review of Systems  Constitutional: Negative.  Negative for chills and fever.  HENT: Negative.  Negative for congestion, hearing loss, sinus pain, sore throat and tinnitus.   Eyes: Negative.  Negative for blurred vision and double vision.  Respiratory: Negative.  Negative for cough, hemoptysis, sputum production, shortness of breath and wheezing.   Cardiovascular: Negative.  Negative for chest pain, palpitations and leg swelling.  Gastrointestinal: Negative.  Negative for abdominal pain, constipation, diarrhea, heartburn, nausea and vomiting.  Genitourinary: Negative.  Negative for dysuria and urgency.  Musculoskeletal: Negative.  Negative for back pain, falls, joint pain, myalgias and neck pain.  Skin: Negative.  Negative for rash.  Neurological:  Positive for dizziness (when rolls over when lying flat). Negative for tingling, tremors, sensory change, speech change, focal weakness, seizures, loss of consciousness, weakness and headaches.  Endo/Heme/Allergies: Negative.  Does not bruise/bleed easily.  Psychiatric/Behavioral:  Negative for depression, hallucinations, memory loss, substance abuse and suicidal ideas. The patient is nervous/anxious and has insomnia.      Physical Exam: Estimated body mass index is 21.24 kg/m as calculated from the following:   Height as of this encounter: 5' 1.75" (1.568  m).   Weight as of this encounter: 115 lb 3.2 oz (52.3 kg). BP 98/66   Pulse 81   Temp (!) 97.5 F (36.4 C)   Ht 5' 1.75" (1.568 m)   Wt 115 lb 3.2 oz (52.3 kg)   LMP 05/11/2012   SpO2 97%   BMI 21.24 kg/m  General Appearance: Well nourished, in no apparent distress. Eyes: PERRLA, EOMs, conjunctiva no swelling or erythema, normal fundi and vessels. Sinuses: No Frontal/maxillary tenderness ENT/Mouth: Ext aud canals clear, normal light reflex with TMs without erythema, bulging.  Good dentition. No erythema, swelling, or exudate on post pharynx. Tonsils not swollen or erythematous. Hearing normal.  Neck: Supple, thyroid normal. No bruits Respiratory: Respiratory effort normal, BS equal bilaterally without rales, rhonchi, wheezing or stridor. Cardio: RRR without murmurs, rubs or gallops. Brisk peripheral pulses without edema.  Chest: symmetric, with normal excursions and percussion. Breasts:Defer- Mammogram UTD Abdomen: Soft, +BS. Non tender, no guarding, rebound, hernias, masses, or organomegaly. .  Lymphatics: Non tender without lymphadenopathy.  Musculoskeletal: Full ROM all peripheral extremities,5/5 strength, and normal gait. Skin: Warm, dry without rashes, lesions, ecchymosis.  Neuro: Cranial nerves intact, reflexes equal bilaterally. Normal muscle tone, no cerebellar symptoms. Sensation intact.  Psych: Awake and oriented X 3, normal affect, Insight and Judgment appropriate.   EKG:  NSR ,no ST changes   [facility]  03/23/2022

## 2022-03-23 ENCOUNTER — Ambulatory Visit (INDEPENDENT_AMBULATORY_CARE_PROVIDER_SITE_OTHER): Payer: 59 | Admitting: Nurse Practitioner

## 2022-03-23 ENCOUNTER — Encounter: Payer: Self-pay | Admitting: Nurse Practitioner

## 2022-03-23 VITALS — BP 98/66 | HR 81 | Temp 97.5°F | Ht 61.75 in | Wt 115.2 lb

## 2022-03-23 DIAGNOSIS — E559 Vitamin D deficiency, unspecified: Secondary | ICD-10-CM | POA: Diagnosis not present

## 2022-03-23 DIAGNOSIS — Z1329 Encounter for screening for other suspected endocrine disorder: Secondary | ICD-10-CM

## 2022-03-23 DIAGNOSIS — I10 Essential (primary) hypertension: Secondary | ICD-10-CM | POA: Diagnosis not present

## 2022-03-23 DIAGNOSIS — Z131 Encounter for screening for diabetes mellitus: Secondary | ICD-10-CM

## 2022-03-23 DIAGNOSIS — Z0000 Encounter for general adult medical examination without abnormal findings: Secondary | ICD-10-CM

## 2022-03-23 DIAGNOSIS — F988 Other specified behavioral and emotional disorders with onset usually occurring in childhood and adolescence: Secondary | ICD-10-CM

## 2022-03-23 DIAGNOSIS — Z0001 Encounter for general adult medical examination with abnormal findings: Secondary | ICD-10-CM

## 2022-03-23 DIAGNOSIS — Z79899 Other long term (current) drug therapy: Secondary | ICD-10-CM | POA: Diagnosis not present

## 2022-03-23 DIAGNOSIS — Z136 Encounter for screening for cardiovascular disorders: Secondary | ICD-10-CM

## 2022-03-23 DIAGNOSIS — Z1389 Encounter for screening for other disorder: Secondary | ICD-10-CM

## 2022-03-23 DIAGNOSIS — E785 Hyperlipidemia, unspecified: Secondary | ICD-10-CM

## 2022-03-23 DIAGNOSIS — Z6821 Body mass index (BMI) 21.0-21.9, adult: Secondary | ICD-10-CM

## 2022-03-23 DIAGNOSIS — K219 Gastro-esophageal reflux disease without esophagitis: Secondary | ICD-10-CM | POA: Diagnosis not present

## 2022-03-23 DIAGNOSIS — R42 Dizziness and giddiness: Secondary | ICD-10-CM

## 2022-03-23 DIAGNOSIS — F411 Generalized anxiety disorder: Secondary | ICD-10-CM

## 2022-03-23 DIAGNOSIS — C4491 Basal cell carcinoma of skin, unspecified: Secondary | ICD-10-CM

## 2022-03-23 MED ORDER — MECLIZINE HCL 25 MG PO TABS: ORAL_TABLET | ORAL | 0 refills | Status: DC

## 2022-03-24 LAB — COMPLETE METABOLIC PANEL WITH GFR
AG Ratio: 2 (calc) (ref 1.0–2.5)
ALT: 17 U/L (ref 6–29)
AST: 22 U/L (ref 10–35)
Albumin: 4.7 g/dL (ref 3.6–5.1)
Alkaline phosphatase (APISO): 95 U/L (ref 37–153)
BUN: 12 mg/dL (ref 7–25)
CO2: 27 mmol/L (ref 20–32)
Calcium: 9.6 mg/dL (ref 8.6–10.4)
Chloride: 104 mmol/L (ref 98–110)
Creat: 0.75 mg/dL (ref 0.50–1.05)
Globulin: 2.4 g/dL (calc) (ref 1.9–3.7)
Glucose, Bld: 82 mg/dL (ref 65–99)
Potassium: 4 mmol/L (ref 3.5–5.3)
Sodium: 140 mmol/L (ref 135–146)
Total Bilirubin: 0.5 mg/dL (ref 0.2–1.2)
Total Protein: 7.1 g/dL (ref 6.1–8.1)
eGFR: 90 mL/min/{1.73_m2} (ref >=60)

## 2022-03-24 LAB — CBC WITH DIFFERENTIAL/PLATELET
Absolute Monocytes: 380 cells/uL (ref 200–950)
Basophils Absolute: 30 cells/uL (ref 0–200)
Basophils Relative: 0.6 %
Eosinophils Absolute: 110 cells/uL (ref 15–500)
Eosinophils Relative: 2.2 %
HCT: 43 % (ref 35.0–45.0)
Hemoglobin: 14.9 g/dL (ref 11.7–15.5)
Lymphs Abs: 1845 cells/uL (ref 850–3900)
MCH: 31.8 pg (ref 27.0–33.0)
MCHC: 34.7 g/dL (ref 32.0–36.0)
MCV: 91.9 fL (ref 80.0–100.0)
MPV: 11 fL (ref 7.5–12.5)
Monocytes Relative: 7.6 %
Neutro Abs: 2635 cells/uL (ref 1500–7800)
Neutrophils Relative %: 52.7 %
Platelets: 248 10*3/uL (ref 140–400)
RBC: 4.68 10*6/uL (ref 3.80–5.10)
RDW: 12.4 % (ref 11.0–15.0)
Total Lymphocyte: 36.9 %
WBC: 5 10*3/uL (ref 3.8–10.8)

## 2022-03-24 LAB — MICROALBUMIN / CREATININE URINE RATIO
Creatinine, Urine: 46 mg/dL (ref 20–275)
Microalb Creat Ratio: 4 mcg/mg creat (ref <30)
Microalb, Ur: 0.2 mg/dL

## 2022-03-24 LAB — URINALYSIS, ROUTINE W REFLEX MICROSCOPIC
Bacteria, UA: NONE SEEN /HPF
Bilirubin Urine: NEGATIVE
Glucose, UA: NEGATIVE
Hgb urine dipstick: NEGATIVE
Hyaline Cast: NONE SEEN /LPF
Ketones, ur: NEGATIVE
Nitrite: NEGATIVE
Protein, ur: NEGATIVE
RBC / HPF: NONE SEEN /HPF (ref 0–2)
Specific Gravity, Urine: 1.006 (ref 1.001–1.035)
Squamous Epithelial / HPF: NONE SEEN /HPF (ref <=5)
WBC, UA: NONE SEEN /HPF (ref 0–5)
pH: 5.5 (ref 5.0–8.0)

## 2022-03-24 LAB — LIPID PANEL
Cholesterol: 194 mg/dL (ref <200)
HDL: 73 mg/dL (ref >=50)
LDL Cholesterol (Calc): 105 mg/dL (calc) — ABNORMAL HIGH
Non-HDL Cholesterol (Calc): 121 mg/dL (calc) (ref <130)
Total CHOL/HDL Ratio: 2.7 (calc) (ref <5.0)
Triglycerides: 75 mg/dL (ref <150)

## 2022-03-24 LAB — HEMOGLOBIN A1C
Hgb A1c MFr Bld: 4.9 % of total Hgb (ref <5.7)
Mean Plasma Glucose: 94 mg/dL
eAG (mmol/L): 5.2 mmol/L

## 2022-03-24 LAB — MAGNESIUM: Magnesium: 2.3 mg/dL (ref 1.5–2.5)

## 2022-03-24 LAB — MICROSCOPIC MESSAGE

## 2022-03-24 LAB — TSH: TSH: 2.72 mIU/L (ref 0.40–4.50)

## 2022-03-24 LAB — VITAMIN D 25 HYDROXY (VIT D DEFICIENCY, FRACTURES): Vit D, 25-Hydroxy: 80 ng/mL (ref 30–100)

## 2022-03-25 ENCOUNTER — Encounter: Payer: Self-pay | Admitting: Nurse Practitioner

## 2022-04-06 DIAGNOSIS — J069 Acute upper respiratory infection, unspecified: Secondary | ICD-10-CM | POA: Diagnosis not present

## 2022-04-06 DIAGNOSIS — R350 Frequency of micturition: Secondary | ICD-10-CM | POA: Diagnosis not present

## 2022-04-06 DIAGNOSIS — Z20822 Contact with and (suspected) exposure to covid-19: Secondary | ICD-10-CM | POA: Diagnosis not present

## 2022-04-06 DIAGNOSIS — Z03818 Encounter for observation for suspected exposure to other biological agents ruled out: Secondary | ICD-10-CM | POA: Diagnosis not present

## 2022-04-06 DIAGNOSIS — Z6820 Body mass index (BMI) 20.0-20.9, adult: Secondary | ICD-10-CM | POA: Diagnosis not present

## 2022-04-06 NOTE — Progress Notes (Deleted)
Assessment and Plan:  There are no diagnoses linked to this encounter.    Further disposition pending results of labs. Discussed med's effects and SE's.   Over 30 minutes of exam, counseling, chart review, and critical decision making was performed.   Future Appointments  Date Time Provider [place]  04/07/2022  9:30 AM Alycia Rossetti, NP [facility] None  04/28/2022  8:30 AM [facility] DX DEXA 1 [facility]  05/13/2022  2:30 PM [name], MD [facility] None  07/28/2022  9:10 AM Pieter Partridge, DO [facility] None  09/26/2022  9:30 AM Alycia Rossetti, NP [facility] None  03/24/2023  9:00 AM Alycia Rossetti, NP [facility] None    ------------------------------------------------------------------------------------------------------------------   HPI LMP 05/11/2012  61 y.o.female presents for  Past Medical History:  Diagnosis Date   ADD (attention deficit disorder)    Atypical nevus 01/11/1995   Medial Lower Abdomen - Slight   Basal cell carcinoma 09/07/2020   nod- left temporal scalp   BCC (basal cell carcinoma of skin) 04/30/2008   Right Neck   Blurred vision    Cataract    Mixed form OU   Hemangioma    on liver   Seasonal allergies      Allergies  Allergen Reactions   Iodinated Contrast Media Anaphylaxis   Erythromycin Nausea And Vomiting    Current Outpatient Medications on File Prior to Visit  Medication Sig   Biotin 10 MG TABS Take by mouth.   Cholecalciferol 125 MCG (5000 UT) capsule Take 5,000 Units by mouth daily.   Cyanocobalamin (VITAMIN B 12 PO) Take by mouth daily.   cycloSPORINE (RESTASIS) 0.05 % ophthalmic emulsion Place 1 drop into both eyes 2 (two) times daily.   EQ ALLERGY RELIEF, CETIRIZINE, 10 MG tablet TAKE 1 TABLET BY MOUTH ONCE DAILY FOR ALLERGIES   hydroquinone 4 % cream APPLY TOPICALLY TWICE A DAY   meclizine (ANTIVERT) 25 MG tablet 1/2-1 pill up to 3 times daily for motion sickness/dizziness   OVER THE COUNTER MEDICATION  Turmeric daily   OVER THE COUNTER MEDICATION Nutrafol for hair loss   rosuvastatin (CRESTOR) 5 MG tablet Take 5 mg by mouth daily.   tretinoin (RETIN-A) 0.05 % cream APPLY TO AFFECTED AREA EVERY DAY AT BEDTIME   triamcinolone (NASACORT) 55 MCG/ACT AERO nasal inhaler SPRAY 2 SPRAYS NASALLY DAILY   No current facility-administered medications on file prior to visit.    ROS: all negative except above.   Physical Exam:  LMP 05/11/2012   General Appearance: Well nourished, in no apparent distress. Eyes: PERRLA, EOMs, conjunctiva no swelling or erythema Sinuses: No Frontal/maxillary tenderness ENT/Mouth: Ext aud canals clear, TMs without erythema, bulging. No erythema, swelling, or exudate on post pharynx.  Tonsils not swollen or erythematous. Hearing normal.  Neck: Supple, thyroid normal.  Respiratory: Respiratory effort normal, BS equal bilaterally without rales, rhonchi, wheezing or stridor.  Cardio: RRR with no MRGs. Brisk peripheral pulses without edema.  Abdomen: Soft, + BS.  Non tender, no guarding, rebound, hernias, masses. Lymphatics: Non tender without lymphadenopathy.  Musculoskeletal: Full ROM, 5/5 strength, normal gait.  Skin: Warm, dry without rashes, lesions, ecchymosis.  Neuro: Cranial nerves intact. Normal muscle tone, no cerebellar symptoms. Sensation intact.  Psych: Awake and oriented X 3, normal affect, Insight and Judgment appropriate.     Alycia Rossetti, NP 9:50 AM [facility]

## 2022-04-07 ENCOUNTER — Ambulatory Visit: Payer: 59 | Admitting: Nurse Practitioner

## 2022-04-27 DIAGNOSIS — Z6820 Body mass index (BMI) 20.0-20.9, adult: Secondary | ICD-10-CM | POA: Diagnosis not present

## 2022-04-27 DIAGNOSIS — N3000 Acute cystitis without hematuria: Secondary | ICD-10-CM | POA: Diagnosis not present

## 2022-04-28 ENCOUNTER — Ambulatory Visit
Admission: RE | Admit: 2022-04-28 | Discharge: 2022-04-28 | Disposition: A | Discharge status: Home / Self Care | Payer: 59 | Source: Ambulatory Visit | Attending: Nurse Practitioner | Admitting: Nurse Practitioner

## 2022-04-28 DIAGNOSIS — Z780 Asymptomatic menopausal state: Secondary | ICD-10-CM | POA: Diagnosis not present

## 2022-04-28 DIAGNOSIS — E2839 Other primary ovarian failure: Secondary | ICD-10-CM

## 2022-04-28 DIAGNOSIS — M810 Age-related osteoporosis without current pathological fracture: Secondary | ICD-10-CM | POA: Diagnosis not present

## 2022-04-29 NOTE — Progress Notes (Addendum)
[facility] Note  05/13/2022     CHIEF COMPLAINT Patient presents for Retina Follow Up    HISTORY OF PRESENT ILLNESS: Amanda Salazar is a 61 y.o. female who presents to the clinic today for:   HPI     Retina Follow Up   Patient presents with  Other.  In right eye.  This started 8 months ago.  I, the attending physician,  performed the HPI with the patient and updated documentation appropriately.        Comments   Patient here for 1 yr (8 months) retina follow up for ERM OD. Patient states vision bad. It is just always bad. No eye pain.       Last edited by [name], MD on 05/14/2022  1:33 AM.    Patient states she feels that vision is getting worse. Vision always seems bad.   Referring physician: Vevelyn Royals, MD [address] [place],  [place] [address]  HISTORICAL INFORMATION:   Selected notes from the MEDICAL RECORD NUMBER Referred by Dr. Zigmund Daniel for possible RD LEE- 04.27.18 (JDM) [BCVA OD: 20/20-1 OS: 20/20-1]   CURRENT MEDICATIONS: Current Outpatient Medications (Ophthalmic Drugs)  Medication Sig   cycloSPORINE (RESTASIS) 0.05 % ophthalmic emulsion Place 1 drop into both eyes 2 (two) times daily.   No current facility-administered medications for this visit. (Ophthalmic Drugs)   Current Outpatient Medications (Other)  Medication Sig   Biotin 10 MG TABS Take by mouth.   Cholecalciferol 125 MCG (5000 UT) capsule Take 5,000 Units by mouth daily.   Cyanocobalamin (VITAMIN B 12 PO) Take by mouth daily.   EQ ALLERGY RELIEF, CETIRIZINE, 10 MG tablet TAKE 1 TABLET BY MOUTH ONCE DAILY FOR ALLERGIES   hydroquinone 4 % cream APPLY TOPICALLY TWICE A DAY   meclizine (ANTIVERT) 25 MG tablet 1/2-1 pill up to 3 times daily for motion sickness/dizziness   OVER THE COUNTER MEDICATION Turmeric daily   OVER THE COUNTER MEDICATION Nutrafol for hair loss   rosuvastatin (CRESTOR) 5 MG tablet Take 5 mg by mouth daily.   tretinoin  (RETIN-A) 0.05 % cream APPLY TO AFFECTED AREA EVERY DAY AT BEDTIME   triamcinolone (NASACORT) 55 MCG/ACT AERO nasal inhaler SPRAY 2 SPRAYS NASALLY DAILY   No current facility-administered medications for this visit. (Other)   REVIEW OF SYSTEMS: ROS   Positive for: Gastrointestinal, Eyes, Psychiatric, Heme/Lymph Negative for: Constitutional, Neurological, Skin, Genitourinary, Musculoskeletal, HENT, Endocrine, Cardiovascular, Respiratory, Allergic/Imm Last edited by Theodore Demark, COA on 05/13/2022  2:42 PM.     ALLERGIES Allergies  Allergen Reactions   Iodinated Contrast Media Anaphylaxis   Erythromycin Nausea And Vomiting   PAST MEDICAL HISTORY Past Medical History:  Diagnosis Date   ADD (attention deficit disorder)    Atypical nevus 01/11/1995   Medial Lower Abdomen - Slight   Basal cell carcinoma 09/07/2020   nod- left temporal scalp   BCC (basal cell carcinoma of skin) 04/30/2008   Right Neck   Blurred vision    Cataract    Mixed form OU   Hemangioma    on liver   Seasonal allergies    Past Surgical History:  Procedure Laterality Date   BREAST EXCISIONAL BIOPSY Bilateral    BREAST SURGERY     biopsy x 3   CESAREAN SECTION     x 4   EYE SURGERY     FAMILY HISTORY Family History  Problem Relation Age of Onset   Hypertension  Mother    SOCIAL HISTORY Social History   Tobacco Use   Smoking status: Never   Smokeless tobacco: Never  Vaping Use   Vaping Use: Never used  Substance Use Topics   Alcohol use: Yes    Comment: glass wine most nights   Drug use: No       OPHTHALMIC EXAM:  Base Eye Exam     Visual Acuity (Snellen - Linear)       Right Left   Dist [place] 20/50 +2 20/30   Dist ph  20/25 -2 20/20 -2         Tonometry (Tonopen, 2:39 PM)       Right Left   Pressure 18 16         Pupils       Dark Light Shape React APD   Right 3 2 Round Brisk None   Left 3 2 Round Brisk None         Visual Fields (Counting fingers)        Left Right    Full Full         Extraocular Movement       Right Left    Full, Ortho Full, Ortho         Neuro/Psych     Oriented x3: Yes   Mood/Affect: Normal         Dilation     Both eyes: 1.0% Mydriacyl, 2.5% Phenylephrine @ 2:39 PM           Slit Lamp and Fundus Exam     Slit Lamp Exam       Right Left   Lids/Lashes Mild Meibomian gland dysfunction, Dermatochalasis - upper lid Mild Meibomian gland dysfunction, Dermatochalasis - upper lid   Conjunctiva/Sclera White and quiet White and quiet   Cornea Arcus, 4 RK scars at 1200, 0300, 0600, and 0900, 1+ fine Punctate epithelial erosions Arcus, 1+ Punctate epithelial erosions, 4 RK scars at 1100, 0200, 0500, 0800   Anterior Chamber Deep and quiet Deep and quiet   Iris Round and dilated Round and dilated   Lens 2+ Nuclear sclerosis, 2-3+ Cortical cataract 2+ Nuclear sclerosis, 2-3+ Cortical cataract   Anterior Vitreous Mild Vitreous syneresis, Posterior vitreous detachment, Weiss ring, no pigment, vitreous condensations Vitreous syneresis, Posterior vitreous detachment, Weiss ring, vitreous condensations         Fundus Exam       Right Left   Disc Pink and Sharp, Compact Pink and Sharp, Compact   C/D Ratio 0.3 0.4   Macula Flat, good foveal reflex, Nasal Epiretinal membrane with striae and mild central thickening - stable from prior, No heme Flat, Good foveal reflex, mild Retinal pigment epithelial mottling, No heme or edema   Vessels attenuated, mild tortuosity, mild Copper wiring mild attenuation, mild tortuosity, mild copper wiring   Periphery Attached, small hole with good laser surrounding at 1100, No new RT/RD, No heme Attached, small horseshoe tear at 0900 - good laser changes surrounding, No new RT/RD            IMAGING AND PROCEDURES  Imaging and Procedures for 11/17/17  OCT, Retina - OU - Both Eyes       Right Eye Quality was good. Central Foveal Thickness: 439. Progression has been  stable. Findings include no IRF, no SRF, abnormal foveal contour, epiretinal membrane, macular pucker (Focal ERM nasal macula and fovea with pucker and PRF, thickening of nasal fovea--stable from prior).   Left Eye Quality was  good. Central Foveal Thickness: 244. Progression has been stable. Findings include normal foveal contour, no IRF, no SRF.   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 CME - cystoid macular edema. PED - pigment epithelial detachment. IRF - intraretinal fluid. SRF - subretinal fluid. EZ - ellipsoid zone. ERM - epiretinal membrane. ORA - outer retinal atrophy. ORT - outer retinal tubulation. SRHM - subretinal hyper-reflective material             ASSESSMENT/PLAN:    ICD-10-CM   1. Epiretinal membrane (ERM) of right eye  H35.371 OCT, Retina - OU - Both Eyes    2. Retinal tear of left eye  H33.312     3. History of repair of retinal tear by laser photocoagulation  Z98.890     4. Posterior vitreous detachment of both eyes  H43.813 OCT, Retina - OU - Both Eyes    5. Combined forms of age-related cataract of both eyes  H25.813      1. Epiretinal membrane, OD -- stable - BCVA 20/25-1 -- improved from 20/40 and back to baseline - OCT shows stable nasal ERM with pucker, fibrosis and progression of central retinal thickening - no indication for surgery at this time - continue to monitor - f/u 1 year, sooner prn  2. retinal tear, OS    - small horseshoe tear located at 0900 -- no SRF  - S/P laser retinopexy OS (03.11.19) - good laser changes  - no new tears or detachment on repeat 360 peripheral exam  - monitor  3. History of retinal tear OD  - 1100 oclock position  - s/p laser retinopexy with Dr. Zigmund Daniel  - stable  - continue to monitor  4. PVD  OU  - prominent floaters OU (OS > OD)  - Discussed findings and prognosis  - history of RT OU as above - No new RT or RD on 360 exam  - Reviewed s/s of RT/RD  - Strict return precautions for any such RT/RD signs/symptoms  5. Combined form age-related cataract OU   - The symptoms of cataract, surgical options, and treatments and risks were discussed with patient.  - discussed diagnosis and progression  - under the expert management of Dr. Lucita Ferrara  - clear from a retina standpoint to proceed with cataract surgery when pt and surgeon are ready   Ophthalmic Meds Ordered this visit:  No orders of the defined types were placed in this encounter.    Return in about 1 year (around 05/14/2023), or DFE, OCT.  There are no Patient Instructions on file for this visit.  This document serves as a record of services personally performed by Gardiner Sleeper, MD, PhD. It was created on their behalf by Roselee Nova, COMT. The creation of this record is the provider's dictation and/or activities during the visit.  Electronically signed by: Roselee Nova, COMT 05/14/22 1:41 AM  Gardiner Sleeper, M.D., Ph.D. Diseases & Surgery of the Retina and Vitreous [facility]  I have reviewed the above documentation for accuracy and completeness, and I agree with the above. Gardiner Sleeper, M.D., Ph.D. 05/14/22 1:41 AM  Abbreviations: M myopia (nearsighted); A astigmatism; H hyperopia (farsighted); P presbyopia; Mrx spectacle prescription;  CTL contact lenses; OD right eye; OS left  eye; OU both eyes  XT exotropia; ET esotropia; PEK punctate epithelial keratitis; PEE punctate epithelial erosions; DES dry eye syndrome; MGD meibomian gland dysfunction; ATs artificial tears; PFAT's preservative free artificial tears; [place] nuclear sclerotic cataract; PSC posterior subcapsular cataract; ERM epi-retinal membrane; PVD posterior vitreous detachment; RD retinal detachment; DM diabetes mellitus; DR  diabetic retinopathy; NPDR non-proliferative diabetic retinopathy; PDR proliferative diabetic retinopathy; CSME clinically significant macular edema; DME diabetic macular edema; dbh dot blot hemorrhages; CWS cotton wool spot; POAG primary open angle glaucoma; C/D cup-to-disc ratio; HVF humphrey visual field; GVF goldmann visual field; OCT optical coherence tomography; IOP intraocular pressure; BRVO Branch retinal vein occlusion; CRVO central retinal vein occlusion; CRAO central retinal artery occlusion; BRAO branch retinal artery occlusion; RT retinal tear; SB scleral buckle; PPV pars plana vitrectomy; VH Vitreous hemorrhage; PRP panretinal laser photocoagulation; IVK intravitreal kenalog; VMT vitreomacular traction; MH Macular hole;  NVD neovascularization of the disc; NVE neovascularization elsewhere; AREDS age related eye disease study; ARMD age related macular degeneration; POAG primary open angle glaucoma; EBMD epithelial/anterior basement membrane dystrophy; ACIOL anterior chamber intraocular lens; IOL intraocular lens; PCIOL posterior chamber intraocular lens; Phaco/IOL phacoemulsification with intraocular lens placement; [place] photorefractive keratectomy; LASIK laser assisted in situ keratomileusis; HTN hypertension; DM diabetes mellitus; COPD chronic obstructive pulmonary disease

## 2022-05-13 ENCOUNTER — Encounter (INDEPENDENT_AMBULATORY_CARE_PROVIDER_SITE_OTHER): Payer: Self-pay | Admitting: Ophthalmology

## 2022-05-13 ENCOUNTER — Ambulatory Visit (INDEPENDENT_AMBULATORY_CARE_PROVIDER_SITE_OTHER): Payer: 59 | Admitting: Ophthalmology

## 2022-05-13 DIAGNOSIS — H43813 Vitreous degeneration, bilateral: Secondary | ICD-10-CM

## 2022-05-13 DIAGNOSIS — H25813 Combined forms of age-related cataract, bilateral: Secondary | ICD-10-CM | POA: Diagnosis not present

## 2022-05-13 DIAGNOSIS — H35371 Puckering of macula, right eye: Secondary | ICD-10-CM

## 2022-05-13 DIAGNOSIS — H33312 Horseshoe tear of retina without detachment, left eye: Secondary | ICD-10-CM

## 2022-05-13 DIAGNOSIS — Z9889 Other specified postprocedural states: Secondary | ICD-10-CM

## 2022-05-13 DIAGNOSIS — H35341 Macular cyst, hole, or pseudohole, right eye: Secondary | ICD-10-CM

## 2022-05-14 ENCOUNTER — Encounter (INDEPENDENT_AMBULATORY_CARE_PROVIDER_SITE_OTHER): Payer: Self-pay | Admitting: Ophthalmology

## 2022-05-30 ENCOUNTER — Encounter: Payer: Self-pay | Admitting: Internal Medicine

## 2022-06-01 NOTE — Progress Notes (Unsigned)
Assessment and Plan: Amanda Salazar was seen today for acute visit.  Diagnoses and all orders for this visit:  Age-related osteoporosis without current pathological fracture Continue weight bearing exercises, Vit D supplementation, add calcium supplement [phone] daily -     alendronate (FOSAMAX) 70 MG tablet; Take 1 tablet (70 mg total) by mouth every 7 (seven) days. Take with a full glass of water on an empty stomach. Sit upright for 30 minutes  Hair loss Continue to follow with dermatology and follow their recommendation of saw palmetto and minoxidil topical      Further disposition pending results of labs. Discussed med's effects and SE's.   Over 30 minutes of exam, counseling, chart review, and critical decision making was performed.   Future Appointments  Date Time Provider [place]  07/28/2022  9:10 AM Pieter Partridge, DO [facility] None  09/26/2022  9:30 AM Alycia Rossetti, NP [facility] None  03/24/2023  9:00 AM Alycia Rossetti, NP [facility] None  05/15/2023  8:30 AM [name], MD [facility] None    ------------------------------------------------------------------------------------------------------------------   HPI BP 118/72   Pulse 86   Temp 97.7 F (36.5 C)   Ht '5\' 2"'$  (1.575 m)   Wt 116 lb (52.6 kg)   LMP 05/11/2012   SpO2 99%   BMI 21.22 kg/m    Amanda y.o.Salazar presents for discussion of osteoporosis T Score -3.5. Amanda Salazar is not eating dairy because of her high cholesterol.   Amanda Salazar has been losing more hair, has been seen by dermatology. Was recommended to use Saw Palmetto. Also recommended use of minoxidil.   BMI is Body mass index is 21.22 kg/m., Amanda Salazar has not been working on diet and exercise. Wt Readings from Last 3 Encounters:  06/02/22 116 lb (52.6 kg)  03/23/22 115 lb 3.2 oz (52.3 kg)  09/22/21 128 lb 6.4 oz (58.2 kg)     Past Medical History:  Diagnosis Date   ADD (attention deficit disorder)    Atypical nevus 01/11/1995   Medial Lower  Abdomen - Slight   Basal cell carcinoma 09/07/2020   nod- left temporal scalp   BCC (basal cell carcinoma of skin) 04/30/2008   Right Neck   Blurred vision    Cataract    Mixed form OU   Hemangioma    on liver   Seasonal allergies      Allergies  Allergen Reactions   Iodinated Contrast Media Anaphylaxis   Erythromycin Nausea And Vomiting    Current Outpatient Medications on File Prior to Visit  Medication Sig   Cholecalciferol 125 MCG (5000 UT) capsule Take 5,000 Units by mouth daily.   Cyanocobalamin (VITAMIN B 12 PO) Take by mouth daily.   cycloSPORINE (RESTASIS) 0.05 % ophthalmic emulsion Place 1 drop into both eyes 2 (two) times daily.   EQ ALLERGY RELIEF, CETIRIZINE, 10 MG tablet TAKE 1 TABLET BY MOUTH ONCE DAILY FOR ALLERGIES   hydroquinone 4 % cream APPLY TOPICALLY TWICE A DAY   meclizine (ANTIVERT) 25 MG tablet 1/2-1 pill up to 3 times daily for motion sickness/dizziness   OVER THE COUNTER MEDICATION Turmeric daily   OVER THE COUNTER MEDICATION Nutrafol for hair loss   rosuvastatin (CRESTOR) 5 MG tablet Take 5 mg by mouth daily.   tretinoin (RETIN-A) 0.05 % cream APPLY TO AFFECTED AREA EVERY DAY AT BEDTIME   triamcinolone (NASACORT) 55 MCG/ACT AERO nasal inhaler SPRAY 2 SPRAYS NASALLY DAILY   Biotin 10 MG TABS Take by mouth. (Patient not taking: Reported on 06/02/2022)  No current facility-administered medications on file prior to visit.    ROS: all negative except above.   Physical Exam:  BP 118/72   Pulse 86   Temp 97.7 F (36.5 C)   Ht '5\' 2"'$  (1.575 m)   Wt 116 lb (52.6 kg)   LMP 05/11/2012   SpO2 99%   BMI 21.22 kg/m   General Appearance: Very thin, pleasant Salazar, in no apparent distress. Eyes: PERRLA, EOMs, conjunctiva no swelling or erythema Sinuses: No Frontal/maxillary tenderness ENT/Mouth: Ext aud canals clear, TMs without erythema, bulging. No erythema, swelling, or exudate on post pharynx.  Tonsils not swollen or erythematous. Hearing  normal.  Neck: Supple, thyroid normal.  Respiratory: Respiratory effort normal, BS equal bilaterally without rales, rhonchi, wheezing or stridor.  Cardio: RRR with no MRGs. Brisk peripheral pulses without edema.  Abdomen: Soft, + BS.  Non tender, no guarding, rebound, hernias, masses. Lymphatics: Non tender without lymphadenopathy.  Musculoskeletal: Full ROM, 5/5 strength, normal gait.  Skin: Warm, dry without rashes, lesions, ecchymosis. Hair is thin, can see scalp, no patches of baldness Neuro: Cranial nerves intact. Normal muscle tone, no cerebellar symptoms. Sensation intact.  Psych: Awake and oriented X 3, normal affect, Insight and Judgment appropriate.     Alycia Rossetti, NP 10:16 AM [facility]

## 2022-06-02 ENCOUNTER — Encounter: Payer: Self-pay | Admitting: Nurse Practitioner

## 2022-06-02 ENCOUNTER — Ambulatory Visit (INDEPENDENT_AMBULATORY_CARE_PROVIDER_SITE_OTHER): Payer: 59 | Admitting: Nurse Practitioner

## 2022-06-02 VITALS — BP 118/72 | HR 86 | Temp 97.7°F | Ht 62.0 in | Wt 116.0 lb

## 2022-06-02 DIAGNOSIS — L659 Nonscarring hair loss, unspecified: Secondary | ICD-10-CM | POA: Diagnosis not present

## 2022-06-02 DIAGNOSIS — M810 Age-related osteoporosis without current pathological fracture: Secondary | ICD-10-CM

## 2022-06-02 MED ORDER — ALENDRONATE SODIUM 70 MG PO TABS: 70.0000 mg | ORAL_TABLET | ORAL | 11 refills | Status: DC

## 2022-06-02 NOTE — Patient Instructions (Addendum)
Calcium supplement [phone] mg daily Vit D3 5000 units daily Begin Fosamax 1 tab once a week, take first things in the morning and avoid other medications for 30 minutes , sit upright for 30 minutes  Weight bearing exercises- walking , squats, lunges, (weight vest 8 pounds on [employer]) Light arm weights 2-5 pounds 24 reps x 3 per day Pilates is very good for helping improve bone mass/muscle mass and increasing flexibility and balance  Osteoporosis  Osteoporosis is when the bones get thin and weak. This can cause your bones to break (fracture) more easily. What are the causes? The exact cause of this condition is not known. What increases the risk? Having family members with this condition. Not eating enough healthy foods. Taking certain medicines. Being female. Being age 62 or older. Smoking or using other products that contain nicotine or tobacco, such as e-cigarettes or chewing tobacco. Not exercising. Being of European or Asian ancestry. Having a small body frame. What are the signs or symptoms? A broken bone might be the first sign, especially if the break results from a fall or injury that usually would not cause a bone to break. Other signs and symptoms include: Pain in the neck or low back. Being hunched over (stooped posture). Getting shorter. How is this treated? Eating more foods with more calcium and vitamin D in them. Doing exercises. Stopping tobacco use. Limiting how much alcohol you drink. Taking medicines to slow bone loss or help make the bones stronger. Taking supplements of calcium and vitamin D every day. Taking medicines to replace chemicals in the body (hormone replacement medicines). Monitoring your levels of calcium and vitamin D. The goal of treatment is to strengthen your bones and lower your risk for a bone break. Follow these instructions at home: Eating and drinking Eat plenty of calcium and vitamin D. These nutrients are good for your bones. Good  sources of calcium and vitamin D include: Some fish, such as salmon and tuna. Foods that have calcium and vitamin D added to them (fortified foods), such as some breakfast cereals. Egg yolks. Cheese. Liver.  Activity Do exercises as told by your doctor. Ask your doctor what exercises are safe for you. You should do: Exercises that make your muscles work to hold your body weight up (weight-bearing exercises). These include tai chi, yoga, and walking. Exercises to make your muscles stronger. One example is lifting weights. Lifestyle Do not drink alcohol if: Your doctor tells you not to drink. You are pregnant, may be pregnant, or are planning to become pregnant. If you drink alcohol: Limit how much you use to: 0-1 drink a day for women. 0-2 drinks a day for men. Know how much alcohol is in your drink. In the [place] one drink equals one 12 oz bottle of beer (355 mL), one 5 oz glass of wine (148 mL), or one 1 oz glass of hard liquor (44 mL). Do not smoke or use any products that contain nicotine or tobacco. If you need help quitting, ask your doctor. Preventing falls Use tools to help you move around (mobility aids) as needed. These include canes, walkers, scooters, and crutches. Keep rooms well-lit. Put away things on the floor that could make you trip. These include cords and rugs. Install safety rails on stairs. Install grab bars in bathrooms. Use rubber mats in slippery areas, like bathrooms. Wear shoes that: Fit you well. Support your feet. Have closed toes. Have rubber soles or low heels. Tell your doctor about all of  the medicines you are taking. Some medicines can make you more likely to fall. General instructions Take over-the-counter and prescription medicines only as told by your doctor. Keep all follow-up visits. Contact a doctor if: You have not been tested (screened) for osteoporosis and you are: A woman who is age 65 or older. A man who is age 62 or older. Get help  right away if: You fall. You get hurt. Summary Osteoporosis happens when your bones get thin and weak. Weak bones can break (fracture) more easily. Eat plenty of calcium and vitamin D. These are good for your bones. Tell your doctor about all of the medicines that you take. This information is not intended to replace advice given to you by your health care provider. Make sure you discuss any questions you have with your health care provider. Document Revised: 01/09/2020 Document Reviewed: 01/09/2020 Elsevier Patient Education  [place].

## 2022-06-22 ENCOUNTER — Telehealth: Payer: Self-pay | Admitting: Nurse Practitioner

## 2022-06-22 NOTE — Telephone Encounter (Signed)
Patient is requesting a refill on Rosuvastatin to [facility] on [address]

## 2022-07-04 ENCOUNTER — Telehealth: Payer: Self-pay | Admitting: Nurse Practitioner

## 2022-07-04 ENCOUNTER — Other Ambulatory Visit: Payer: Self-pay

## 2022-07-04 MED ORDER — ROSUVASTATIN CALCIUM 5 MG PO TABS: 5.0000 mg | ORAL_TABLET | Freq: Every day | ORAL | 2 refills | Status: DC

## 2022-07-04 NOTE — Telephone Encounter (Signed)
Patient called last week for a refill on Rosuvastatin but pharmacy says they never received anything from [place].Marland KitchenMarland Kitchen

## 2022-07-04 NOTE — Telephone Encounter (Signed)
Sent in

## 2022-07-11 ENCOUNTER — Ambulatory Visit: Payer: Managed Care, Other (non HMO) | Admitting: Neurology

## 2022-07-13 NOTE — Progress Notes (Signed)
NEUROLOGY CONSULTATION NOTE  Amanda Salazar MRN: [id number] DOB: 02/04/1961  Referring provider: [name], MD Primary care provider: [name], MD  Reason for consult:  dizziness  Assessment/Plan:   Benign paroxysmal positional vertigo Blurred vision, subjective  I do not suspect primary neurologic etiology.  Chronic symptoms.  Prior workup with brain MRI in 2018 normal.  She is without vertigo now.  If she should have a recurrence that does not abate, I would recommend vestibular rehab.  Follow up as needed.   Subjective:  Amanda Salazar is a 61 year old right-handed female with hypertension, hyperlipidemia, ADD, generalized anxiety disorder and history of concussion in 2013 who presents for dizziness.  History supplemented by PCP's note.  Patient has history of dizziness off and on for several years, as well as blurred vision.  MRI of brain from 11/14/2016 to evaluate these symptoms was normal.  She began having vertigo several months ago.  When she would be laying in bed and turn over (primarily to the left), she would experience spinning for several seconds.  She was prescribed meclizine but never actually tried it.  Sometimes when she walks, she feels off balance, like she is veering to the side.  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  Vision testing has been okay but she still endorses blurred vision.     PAST MEDICAL HISTORY: Past Medical History:  Diagnosis Date   ADD (attention deficit disorder)    Atypical nevus 01/11/1995   Medial Lower Abdomen - Slight   Basal cell carcinoma 09/07/2020   nod- left temporal scalp   BCC (basal cell carcinoma of skin) 04/30/2008   Right Neck   Blurred vision    Cataract    Mixed form OU   Hemangioma    on liver   Seasonal allergies     PAST SURGICAL HISTORY: Past Surgical History:  Procedure Laterality Date   BREAST EXCISIONAL BIOPSY Bilateral    BREAST  SURGERY     biopsy x 3   CESAREAN SECTION     x 4   EYE SURGERY      MEDICATIONS: Current Outpatient Medications on File Prior to Visit  Medication Sig Dispense Refill   alendronate (FOSAMAX) 70 MG tablet Take 1 tablet (70 mg total) by mouth every 7 (seven) days. Take with a full glass of water on an empty stomach. Sit upright for 30 minutes 4 tablet 11   Biotin 10 MG TABS Take by mouth. (Patient not taking: Reported on 06/02/2022)     Cholecalciferol 125 MCG (5000 UT) capsule Take 5,000 Units by mouth daily.     Cyanocobalamin (VITAMIN B 12 PO) Take by mouth daily.     cycloSPORINE (RESTASIS) 0.05 % ophthalmic emulsion Place 1 drop into both eyes 2 (two) times daily.     EQ ALLERGY RELIEF, CETIRIZINE, 10 MG tablet TAKE 1 TABLET BY MOUTH ONCE DAILY FOR ALLERGIES 90 tablet 1   hydroquinone 4 % cream APPLY TOPICALLY TWICE A DAY 28.35 g 3   meclizine (ANTIVERT) 25 MG tablet 1/2-1 pill up to 3 times daily for motion sickness/dizziness 30 tablet 0   OVER THE COUNTER MEDICATION Turmeric daily     OVER THE COUNTER MEDICATION Nutrafol for hair loss     rosuvastatin (CRESTOR) 5 MG tablet Take 1 tablet (5 mg total) by mouth daily. Take 5 mg by mouth daily. 90 tablet 2   tretinoin (RETIN-A) 0.05 % cream APPLY  TO AFFECTED AREA EVERY DAY AT BEDTIME 45 g 5   triamcinolone (NASACORT) 55 MCG/ACT AERO nasal inhaler SPRAY 2 SPRAYS NASALLY DAILY 50.7 each 3   No current facility-administered medications on file prior to visit.    ALLERGIES: Allergies  Allergen Reactions   Iodinated Contrast Media Anaphylaxis   Erythromycin Nausea And Vomiting    FAMILY HISTORY: Family History  Problem Relation Age of Onset   Hypertension Mother     Objective:  Height '5\' 3"'$  (1.6 m), weight 116 lb (52.6 kg), last menstrual period 05/11/2012, SpO2 98 %. General: Anxious but no acute distress.  Patient appears well-groomed.   Head:  Normocephalic/atraumatic Eyes:  fundi examined but not visualized Neck: supple,  no paraspinal tenderness, full range of motion Back: No paraspinal tenderness Heart: regular rate and rhythm Lungs: Clear to auscultation bilaterally. Vascular: No carotid bruits. Neurological Exam: Mental status: alert and oriented to person, place, and time, speech fluent and not dysarthric, language intact. Cranial nerves: CN I: not tested CN II: pupils equal, round and reactive to light, visual fields intact CN III, IV, VI:  full range of motion, no nystagmus, no ptosis CN V: facial sensation intact. CN VII: upper and lower face symmetric CN VIII: hearing intact CN IX, X: gag intact, uvula midline CN XI: sternocleidomastoid and trapezius muscles intact CN XII: tongue midline Bulk & Tone: normal, no fasciculations. Motor:  muscle strength 5/5 throughout Sensation:  Temperature and vibratory sensation intact. Deep Tendon Reflexes:  2+ throughout,  toes downgoing.   Finger to nose testing:  Without dysmetria.   Heel to shin:  Without dysmetria.   Gait:  Normal station and stride.  Able to turn.  Able to tandem walk.  Romberg negative.    Thank you for allowing me to take part in the care of this patient.  Metta Clines, DO  CC: [name], MD

## 2022-07-15 ENCOUNTER — Encounter: Payer: Self-pay | Admitting: Neurology

## 2022-07-15 ENCOUNTER — Ambulatory Visit (INDEPENDENT_AMBULATORY_CARE_PROVIDER_SITE_OTHER): Payer: 59 | Admitting: Neurology

## 2022-07-15 VITALS — Ht 63.0 in | Wt 116.0 lb

## 2022-07-15 DIAGNOSIS — H8110 Benign paroxysmal vertigo, unspecified ear: Secondary | ICD-10-CM | POA: Diagnosis not present

## 2022-07-15 DIAGNOSIS — H538 Other visual disturbances: Secondary | ICD-10-CM

## 2022-07-28 ENCOUNTER — Ambulatory Visit: Payer: 59 | Admitting: Neurology

## 2022-09-21 ENCOUNTER — Encounter: Payer: Self-pay | Admitting: Internal Medicine

## 2022-09-21 ENCOUNTER — Ambulatory Visit: Payer: 59 | Attending: Internal Medicine | Admitting: Internal Medicine

## 2022-09-21 VITALS — BP 114/70 | HR 78 | Ht 62.0 in | Wt 118.4 lb

## 2022-09-21 DIAGNOSIS — I2584 Coronary atherosclerosis due to calcified coronary lesion: Secondary | ICD-10-CM | POA: Diagnosis not present

## 2022-09-21 DIAGNOSIS — I2510 Atherosclerotic heart disease of native coronary artery without angina pectoris: Secondary | ICD-10-CM | POA: Diagnosis not present

## 2022-09-21 NOTE — Progress Notes (Signed)
Cardiology Office Note:    Date:  09/21/2022  ID:  Amanda Salazar, DOB 03/25/1961, MRN [id number]  PCP:  Amanda Pinto, MD  Cardiologist:  Amanda Munroe, MD  Electrophysiologist:  None   Referring MD: Amanda Pinto, MD   Chief Complaint/Reason for Referral: Altered consciousness, heart murmur  History of Present Illness:    Amanda Salazar is a 62 y.o. female with HLD and no other significant past cardiovascular history here for the evaluation of an episode of altered consciousness and heart murmur referred by Amanda Pinto, MD.  She saw Amanda Lamas, NP on 05/24/2021. She reported feeling acutely fatigued, nauseated, and dizzy, prompting her to lie down. Also had some left hand numbness that resolved prior to EMT arrival. Pulse and blood pressure were low, other vitals normal. On-site EKG was normal. She was noted to have a murmur on exam, and referral to cardiology placed.   At her last visit on 05/27/2021,  she noted feeling SOB with talking. Over that weekend she felt very sleepy, felt faint and had nausea. She experienced chest tightness and presyncope. 4 years ago she recalled an episode of presyncope after getting an injection. She had history of fainting episodes in the past.   Today, she reports that she had been well in 2023. She reports having had 1 syncopal episode since her last visit. She continues to experience vertigo regularly. She has not been taking Meclizine due to her feeling that the vertigo has not been interrupting her general quality of life.   We discussed her recent lipid panel: LDL 105, Trig 75, HDL 73. She reports that she has not been exercising regularly. She also has not been eating as healthy as she would like to.   She has been taking Minoxidil.  She is compliant with Rosuvastatin 5 mg.  She denies any palpitations, chest pain, or peripheral edema. No lightheadedness, headaches, orthopnea, or PND.  Past Medical History:  Diagnosis Date    ADD (attention deficit disorder)    Atypical nevus 01/11/1995   Medial Lower Abdomen - Slight   Basal cell carcinoma 09/07/2020   nod- left temporal scalp   BCC (basal cell carcinoma of skin) 04/30/2008   Right Neck   Blurred vision    Cataract    Mixed form OU   Hemangioma    on liver   Seasonal allergies     Past Surgical History:  Procedure Laterality Date   BREAST EXCISIONAL BIOPSY Bilateral    BREAST SURGERY     biopsy x 3   CESAREAN SECTION     x 4   EYE SURGERY      Current Medications: Current Meds  Medication Sig   alendronate (FOSAMAX) 70 MG tablet Take 1 tablet (70 mg total) by mouth every 7 (seven) days. Take with a full glass of water on an empty stomach. Sit upright for 30 minutes   amoxicillin (AMOXIL) 500 MG capsule Take 500 mg by mouth 3 (three) times daily.   Cholecalciferol 125 MCG (5000 UT) capsule Take 5,000 Units by mouth daily.   Cyanocobalamin (VITAMIN B 12 PO) Take by mouth daily.   cycloSPORINE (RESTASIS) 0.05 % ophthalmic emulsion Place 1 drop into both eyes 2 (two) times daily.   EQ ALLERGY RELIEF, CETIRIZINE, 10 MG tablet TAKE 1 TABLET BY MOUTH ONCE DAILY FOR ALLERGIES   hydroquinone 4 % cream APPLY TOPICALLY TWICE A DAY   ketoconazole (NIZORAL) 2 % shampoo Apply 1 Application topically 2 (two) times a  week.   minoxidil (ROGAINE) 2 % external solution Apply topically 2 (two) times daily.   OVER THE COUNTER MEDICATION Turmeric daily   rosuvastatin (CRESTOR) 5 MG tablet Take 1 tablet (5 mg total) by mouth daily. Take 5 mg by mouth daily.   tretinoin (RETIN-A) 0.05 % cream APPLY TO AFFECTED AREA EVERY DAY AT BEDTIME   triamcinolone (NASACORT) 55 MCG/ACT AERO nasal inhaler SPRAY 2 SPRAYS NASALLY DAILY   [DISCONTINUED] Biotin 10 MG TABS Take by mouth. (Patient not taking: Reported on 09/26/2022)   [DISCONTINUED] OVER THE COUNTER MEDICATION Nutrafol for hair loss     Allergies:   Iodinated contrast media and Erythromycin   Social History    Tobacco Use   Smoking status: Never   Smokeless tobacco: Never  Vaping Use   Vaping Use: Never used  Substance Use Topics   Alcohol use: Yes    Comment: glass wine most nights   Drug use: No     Family History: The patient's family history includes Hypertension in her mother.  ROS:   Please see the history of present illness.    (+) Vertigo  (+) Syncopal episode (1 episode since last visit in 05/2021) All other systems reviewed and are negative.  EKGs/Labs/Other Studies Reviewed:    The following studies were reviewed today:  Echo 06/11/2021: IMPRESSIONS   1. Left ventricular ejection fraction, by estimation, is 60 to 65%. The  left ventricle has normal function. The left ventricle has no regional  wall motion abnormalities. Left ventricular diastolic parameters were  normal. The average left ventricular  global longitudinal strain is 21.4 %. The global longitudinal strain is  normal.   2. Right ventricular systolic function is normal. The right ventricular  size is normal. Tricuspid regurgitation signal is inadequate for assessing  PA pressure.   3. The mitral valve is normal in structure. No evidence of mitral valve  regurgitation.   4. The aortic valve was not well visualized. Aortic valve regurgitation  is not visualized. No aortic stenosis is present.   5. The inferior vena cava is normal in size with greater than 50%  respiratory variability, suggesting right atrial pressure of 3 mmHg.   Coronary Calcium Score ([facility]) 07/07/2020: Findings:  Calcium score: 39 LM: 16 LAD: 23 Cx: 0 RCA: 0  Impression: Definite at least mild calcified coronary artery plaque. Mild or minimal coronary narrowings are likely.   EKG:  EKG is personally reviewed.  09/21/2022: Sinus rhythm. Rate 78 bpm. 05/27/2021: Sinus rhythm. Rate 79 bpm.  Imaging studies that I have independently reviewed today: n/a  Recent Labs: 03/23/2022: TSH 2.72 09/26/2022: ALT 15; BUN 10; Creat  0.64; Hemoglobin 13.4; Magnesium 1.9; Platelets 257; Potassium 4.2; Sodium 140  Recent Lipid Panel    Component Value Date/Time   CHOL 142 09/26/2022 1015   TRIG 88 09/26/2022 1015   HDL 66 09/26/2022 1015   CHOLHDL 2.2 09/26/2022 1015   VLDL 27 03/17/2017 1201   LDLCALC 59 09/26/2022 1015   LDLDIRECT 151.2 01/03/2008 1015    Physical Exam:    VS:  BP 114/70   Pulse 78   Ht 5\' 2"  (1.575 m)   Wt 118 lb 6.4 oz (53.7 kg)   LMP 05/11/2012   SpO2 99%   BMI 21.66 kg/m     Wt Readings from Last 5 Encounters:  09/26/22 118 lb 3.2 oz (53.6 kg)  09/21/22 118 lb 6.4 oz (53.7 kg)  07/15/22 116 lb (52.6 kg)  06/02/22 116 lb (52.6  kg)  03/23/22 115 lb 3.2 oz (52.3 kg)    Constitutional: No acute distress Eyes: sclera non-icteric, normal conjunctiva and lids ENMT: normal dentition, moist mucous membranes Cardiovascular: regular rhythm, normal rate, no murmur. S1 and S2 normal. No jugular venous distention.  Respiratory: clear to auscultation bilaterally GI : normal bowel sounds, soft and nontender. No distention.   MSK: extremities warm, well perfused. No edema.  NEURO: grossly nonfocal exam, moves all extremities. PSYCH: alert and oriented x 3, normal mood and affect.   ASSESSMENT:    1. Coronary artery calcification     PLAN:    1. SOB (shortness of breath) 2. Pre-syncope -reviewed that echo was grossly normal. No definite cardiac cause for symptoms. Pt deferred telemetry monitor. Follows with neurology, who recommended vestibular rehab if sx recur.  3. Pure hypercholesterolemia 4. Coronary artery calcifications - continue crestor 5 mg daily. Would consider uptitration in setting of cor cals and elevated LDL. She will work on diet.    Total time of encounter: 22 minutes total time of encounter, including 15 minutes spent in face-to-face patient care on the date of this encounter. This time includes coordination of care and counseling regarding above mentioned problem  list. Remainder of non-face-to-face time involved reviewing chart documents/testing relevant to the patient encounter and documentation in the medical record. I have independently reviewed documentation from referring provider.   Cherlynn Kaiser, MD, [place]    Shared Decision Making/Informed Consent:       Medication Adjustments/Labs and Tests Ordered: Current medicines are reviewed at length with the patient today.  Concerns regarding medicines are outlined above.   Orders Placed This Encounter  Procedures   EKG 12-Lead   No orders of the defined types were placed in this encounter.  Patient Instructions  Medication Instructions:  No Changes In Medications at this time.  *If you need a refill on your cardiac medications before your next appointment, please call your pharmacy*  Follow-Up: At [facility], you and your health needs are our priority.  As part of our continuing mission to provide you with exceptional heart care, we have created designated Provider Care Teams.  These Care Teams include your primary Cardiologist (physician) and Advanced Practice Providers (APPs -  Physician Assistants and Nurse Practitioners) who all work together to provide you with the care you need, when you need it.  Your next appointment:   1 year(s)  Provider:   Elouise Munroe, MD      I,Rachel Rivera,acting as a scribe for Amanda Munroe, MD.,have documented all relevant documentation on the behalf of Amanda Munroe, MD,as directed by  Amanda Munroe, MD while in the presence of Amanda Munroe, MD.  I, Amanda Munroe, MD, have reviewed all documentation for the visit on 09/21/2022. The documentation on today's date of service for the exam, diagnosis, procedures, and orders are all accurate and complete.

## 2022-09-21 NOTE — Patient Instructions (Signed)
Medication Instructions:  No Changes In Medications at this time.  *If you need a refill on your cardiac medications before your next appointment, please call your pharmacy*  Follow-Up: At [facility], you and your health needs are our priority.  As part of our continuing mission to provide you with exceptional heart care, we have created designated Provider Care Teams.  These Care Teams include your primary Cardiologist (physician) and Advanced Practice Providers (APPs -  Physician Assistants and Nurse Practitioners) who all work together to provide you with the care you need, when you need it.  Your next appointment:   1 year(s)  Provider:   Elouise Munroe, MD

## 2022-09-22 NOTE — Progress Notes (Signed)
FOLLOW UP  Assessment and Plan:   Diagnoses and all orders for this visit:  Attention deficit disorder, unspecified hyperactivity presence  Hyperlipidemia, unspecified hyperlipidemia type Currently on Rosuvastatin 5 mg daily, if LDL is not close to 70 will plan to increase to 10 mg daily Continue diet and exercise -     CBC with Differential/Platelet -     COMPLETE METABOLIC PANEL WITH GFR -     Lipid panel  Generalized anxiety disorder Continue relaxation techniques, good sleep hygiene -     Magnesium  Hair Loss Currently on Rogaine and is using a prescription shampoo Continue to follow with dermatology  Vertigo Symptoms are intermittent Has seen neurology and recommended vestibular rehab if symptoms worsen Continue to monitor  Medication management -     CBC with Differential/Platelet -     COMPLETE METABOLIC PANEL WITH GFR -     Magnesium -     Lipid panel    Continue diet and meds as discussed. Further disposition pending results of labs. Discussed med's effects and SE's.   Over 30 minutes of exam, counseling, chart review, and critical decision making was performed.   Future Appointments  Date Time Provider [place]  03/24/2023  9:00 AM Alycia Rossetti, NP [id number] None  05/15/2023  1:30 PM [name], MD [facility] None    ----------------------------------------------------------------------------------------------------------------------  HPI 62 y.o. female  presents for 6 month follow up   She does have a history of vertigo and has been evaluated by Dr. Tomi Likens neurology who recommended if symptoms return and persist she should do vestibular rehab. Currently symptoms are intermittent.   She had strep throat treated at Urgent Care and is on last day of 10 day course of Amoxicillin.    Patient Active Problem List   Diagnosis Date Noted   ADD (attention deficit disorder) 12/21/2015   Generalized anxiety disorder 04/30/2015   Dense breast  tissue 04/29/2014   Basal cell carcinoma 04/29/2014   Hyperlipidemia 01/08/2008   GERD 01/08/2008   Allergic rhinitis 08/20/2007    She has been to dermatology for hair loss and is currently on Rogaine and prescription shampoo.   BMI is Body mass index is 21.62 kg/m., she has been working on diet and exercise. Wt Readings from Last 3 Encounters:  09/26/22 118 lb 3.2 oz (53.6 kg)  09/21/22 118 lb 6.4 oz (53.7 kg)  07/15/22 116 lb (52.6 kg)    Her blood pressure has been controlled at home, today their BP is BP: 112/62 BP Readings from Last 3 Encounters:  09/26/22 112/62  09/21/22 114/70  06/02/22 118/72   She does workout. She denies chest pain, shortness of breath, dizziness.   She is on cholesterol medication, Rosuvastatin 5 mg daily  Her cholesterol is not at goal. She also was reevaluated by Dr Margaretann Loveless- she again recommends control of cholesterol, LDL < 70. The cholesterol last visit was:   Lab Results  Component Value Date   CHOL 194 03/23/2022   HDL 73 03/23/2022   LDLCALC 105 (H) 03/23/2022   LDLDIRECT 151.2 01/03/2008   TRIG 75 03/23/2022   CHOLHDL 2.7 03/23/2022   Pt does have generalized anxiety disorder. She is currently not taking any medication. Does have occasional episodes of increased anxiety related to high level of stress.  She does have ADD but is not taking medication for symptoms, trying to control with behavior modification.      Current Medications:  Current Outpatient Medications on File Prior to Visit  Medication Sig   alendronate (FOSAMAX) 70 MG tablet Take 1 tablet (70 mg total) by mouth every 7 (seven) days. Take with a full glass of water on an empty stomach. Sit upright for 30 minutes   amoxicillin (AMOXIL) 500 MG capsule Take 500 mg by mouth 3 (three) times daily.   Cholecalciferol 125 MCG (5000 UT) capsule Take 5,000 Units by mouth daily.   Cyanocobalamin (VITAMIN B 12 PO) Take by mouth daily.   cycloSPORINE (RESTASIS) 0.05 % ophthalmic  emulsion Place 1 drop into both eyes 2 (two) times daily.   EQ ALLERGY RELIEF, CETIRIZINE, 10 MG tablet TAKE 1 TABLET BY MOUTH ONCE DAILY FOR ALLERGIES   hydroquinone 4 % cream APPLY TOPICALLY TWICE A DAY   ketoconazole (NIZORAL) 2 % shampoo Apply 1 Application topically 2 (two) times a week.   minoxidil (ROGAINE) 2 % external solution Apply topically 2 (two) times daily.   OVER THE COUNTER MEDICATION Turmeric daily   rosuvastatin (CRESTOR) 5 MG tablet Take 1 tablet (5 mg total) by mouth daily. Take 5 mg by mouth daily.   tretinoin (RETIN-A) 0.05 % cream APPLY TO AFFECTED AREA EVERY DAY AT BEDTIME   triamcinolone (NASACORT) 55 MCG/ACT AERO nasal inhaler SPRAY 2 SPRAYS NASALLY DAILY   Biotin 10 MG TABS Take by mouth. (Patient not taking: Reported on 09/26/2022)   meclizine (ANTIVERT) 25 MG tablet 1/2-1 pill up to 3 times daily for motion sickness/dizziness (Patient not taking: Reported on 09/21/2022)   No current facility-administered medications on file prior to visit.     Allergies:  Allergies  Allergen Reactions   Iodinated Contrast Media Anaphylaxis   Erythromycin Nausea And Vomiting     Medical History:  Past Medical History:  Diagnosis Date   ADD (attention deficit disorder)    Atypical nevus 01/11/1995   Medial Lower Abdomen - Slight   Basal cell carcinoma 09/07/2020   nod- left temporal scalp   BCC (basal cell carcinoma of skin) 04/30/2008   Right Neck   Blurred vision    Cataract    Mixed form OU   Hemangioma    on liver   Seasonal allergies    Family history- Reviewed and unchanged Social history- Reviewed and unchanged   Review of Systems:  Review of Systems  Constitutional:  Negative for chills, fever and weight loss.  HENT:  Negative for congestion and hearing loss.   Eyes:  Negative for blurred vision and double vision.  Respiratory:  Negative for cough and shortness of breath.   Cardiovascular:  Negative for chest pain, palpitations, orthopnea and leg  swelling.  Gastrointestinal:  Negative for abdominal pain, constipation, diarrhea, heartburn, nausea and vomiting.  Musculoskeletal:  Negative for falls, joint pain and myalgias.  Skin:  Negative for rash.       Hair loss  Neurological:  Positive for dizziness (vertigo). Negative for tingling, tremors, loss of consciousness, weakness and headaches.  Psychiatric/Behavioral:  Negative for depression, memory loss and suicidal ideas.       Physical Exam: BP 112/62   Pulse 81   Temp (!) 97.5 F (36.4 C)   Wt 118 lb 3.2 oz (53.6 kg)   LMP 05/11/2012   SpO2 99%   BMI 21.62 kg/m  Wt Readings from Last 3 Encounters:  09/26/22 118 lb 3.2 oz (53.6 kg)  09/21/22 118 lb 6.4 oz (53.7 kg)  07/15/22 116 lb (52.6 kg)   General Appearance: Well nourished, in no apparent distress. Eyes: PERRLA, EOMs, conjunctiva no swelling or  erythema Sinuses: No Frontal/maxillary tenderness ENT/Mouth: Ext aud canals clear, TMs without erythema, bulging. No erythema, swelling, or exudate on post pharynx.  Tonsils not swollen or erythematous. Hearing normal.  Neck: Supple, thyroid normal.  Respiratory: Respiratory effort normal, BS equal bilaterally without rales, rhonchi, wheezing or stridor.  Cardio: RRR , faint midsystolic murmur heard.  Brisk peripheral pulses without edema.  Abdomen: Soft, + BS.  Non tender, no guarding, rebound, hernias, masses. Lymphatics: Non tender without lymphadenopathy.  Musculoskeletal: Full ROM, 5/5 strength, Normal gait Skin: Warm, dry without rashes, lesions, ecchymosis. Thinning of hair is noted Neuro: Cranial nerves intact. No cerebellar symptoms.  Psych: Awake and oriented X 3, normal affect, Insight and Judgment appropriate.    Alycia Rossetti, NP 9:57 AM [facility]

## 2022-09-26 ENCOUNTER — Ambulatory Visit (INDEPENDENT_AMBULATORY_CARE_PROVIDER_SITE_OTHER): Payer: 59 | Admitting: Nurse Practitioner

## 2022-09-26 ENCOUNTER — Encounter: Payer: Self-pay | Admitting: Nurse Practitioner

## 2022-09-26 VITALS — BP 112/62 | HR 81 | Temp 97.5°F | Wt 118.2 lb

## 2022-09-26 DIAGNOSIS — Z79899 Other long term (current) drug therapy: Secondary | ICD-10-CM

## 2022-09-26 DIAGNOSIS — K219 Gastro-esophageal reflux disease without esophagitis: Secondary | ICD-10-CM

## 2022-09-26 DIAGNOSIS — L659 Nonscarring hair loss, unspecified: Secondary | ICD-10-CM

## 2022-09-26 DIAGNOSIS — R42 Dizziness and giddiness: Secondary | ICD-10-CM | POA: Diagnosis not present

## 2022-09-26 DIAGNOSIS — F411 Generalized anxiety disorder: Secondary | ICD-10-CM | POA: Diagnosis not present

## 2022-09-26 DIAGNOSIS — E785 Hyperlipidemia, unspecified: Secondary | ICD-10-CM

## 2022-09-26 DIAGNOSIS — F988 Other specified behavioral and emotional disorders with onset usually occurring in childhood and adolescence: Secondary | ICD-10-CM

## 2022-09-26 DIAGNOSIS — E559 Vitamin D deficiency, unspecified: Secondary | ICD-10-CM

## 2022-09-26 DIAGNOSIS — R69 Illness, unspecified: Secondary | ICD-10-CM | POA: Diagnosis not present

## 2022-09-26 NOTE — Patient Instructions (Signed)

## 2022-09-27 LAB — COMPLETE METABOLIC PANEL WITH GFR
AG Ratio: 1.8 (calc) (ref 1.0–2.5)
ALT: 15 U/L (ref 6–29)
AST: 20 U/L (ref 10–35)
Albumin: 4.2 g/dL (ref 3.6–5.1)
Alkaline phosphatase (APISO): 73 U/L (ref 37–153)
BUN: 10 mg/dL (ref 7–25)
CO2: 26 mmol/L (ref 20–32)
Calcium: 9.4 mg/dL (ref 8.6–10.4)
Chloride: 106 mmol/L (ref 98–110)
Creat: 0.64 mg/dL (ref 0.50–1.05)
Globulin: 2.4 g/dL (calc) (ref 1.9–3.7)
Glucose, Bld: 94 mg/dL (ref 65–99)
Potassium: 4.2 mmol/L (ref 3.5–5.3)
Sodium: 140 mmol/L (ref 135–146)
Total Bilirubin: 0.3 mg/dL (ref 0.2–1.2)
Total Protein: 6.6 g/dL (ref 6.1–8.1)
eGFR: 99 mL/min/{1.73_m2} (ref >=60)

## 2022-09-27 LAB — LIPID PANEL
Cholesterol: 142 mg/dL (ref <200)
HDL: 66 mg/dL (ref >=50)
LDL Cholesterol (Calc): 59 mg/dL (calc)
Non-HDL Cholesterol (Calc): 76 mg/dL (calc) (ref <130)
Total CHOL/HDL Ratio: 2.2 (calc) (ref <5.0)
Triglycerides: 88 mg/dL (ref <150)

## 2022-09-27 LAB — CBC WITH DIFFERENTIAL/PLATELET
Absolute Monocytes: 470 cells/uL (ref 200–950)
Basophils Absolute: 12 cells/uL (ref 0–200)
Basophils Relative: 0.2 %
Eosinophils Absolute: 197 cells/uL (ref 15–500)
Eosinophils Relative: 3.4 %
HCT: 38.1 % (ref 35.0–45.0)
Hemoglobin: 13.4 g/dL (ref 11.7–15.5)
Lymphs Abs: 1960 cells/uL (ref 850–3900)
MCH: 31.6 pg (ref 27.0–33.0)
MCHC: 35.2 g/dL (ref 32.0–36.0)
MCV: 89.9 fL (ref 80.0–100.0)
MPV: 10.9 fL (ref 7.5–12.5)
Monocytes Relative: 8.1 %
Neutro Abs: 3161 cells/uL (ref 1500–7800)
Neutrophils Relative %: 54.5 %
Platelets: 257 10*3/uL (ref 140–400)
RBC: 4.24 10*6/uL (ref 3.80–5.10)
RDW: 12.5 % (ref 11.0–15.0)
Total Lymphocyte: 33.8 %
WBC: 5.8 10*3/uL (ref 3.8–10.8)

## 2022-09-27 LAB — MAGNESIUM: Magnesium: 1.9 mg/dL (ref 1.5–2.5)

## 2022-09-27 NOTE — Progress Notes (Signed)
[facility] Note  09/28/2022     CHIEF COMPLAINT Patient presents for Retina Evaluation    HISTORY OF PRESENT ILLNESS: Amanda Salazar is a 62 y.o. female who presents to the clinic today for:   HPI     Retina Evaluation   In right eye.  Associated Symptoms Floaters.  I, the attending physician,  performed the HPI with the patient and updated documentation appropriately.        Comments   Patient here for Retina Evaluation. Patient states vision not good. Has floaters. No eye pain. Uses Restasis BID OU. Added Manoxidil OTC generic for topical rogain.      Last edited by [name], MD on 09/30/2022  2:00 AM.    Pt feels like for the past couple of weeks her vision has not been good, she feels like the floaters are worse in both eyes, but especially her right eye, she is using Restasis  Referring physician: [name], MD [place],  [place] [address]  HISTORICAL INFORMATION:   Selected notes from the [facility] Referred by Dr. Zigmund Daniel for possible RD LEE- 04.27.18 (JDM) [BCVA OD: 20/20-1 OS: 20/20-1]   CURRENT MEDICATIONS: Current Outpatient Medications (Ophthalmic Drugs)  Medication Sig   cycloSPORINE (RESTASIS) 0.05 % ophthalmic emulsion Place 1 drop into both eyes 2 (two) times daily.   No current facility-administered medications for this visit. (Ophthalmic Drugs)   Current Outpatient Medications (Other)  Medication Sig   alendronate (FOSAMAX) 70 MG tablet Take 1 tablet (70 mg total) by mouth every 7 (seven) days. Take with a full glass of water on an empty stomach. Sit upright for 30 minutes   amoxicillin (AMOXIL) 500 MG capsule Take 500 mg by mouth 3 (three) times daily.   Cholecalciferol 125 MCG (5000 UT) capsule Take 5,000 Units by mouth daily.   Cyanocobalamin (VITAMIN B 12 PO) Take by mouth daily.   EQ ALLERGY RELIEF, CETIRIZINE, 10 MG tablet TAKE 1 TABLET BY MOUTH ONCE DAILY FOR  ALLERGIES   hydroquinone 4 % cream APPLY TOPICALLY TWICE A DAY   ketoconazole (NIZORAL) 2 % shampoo Apply 1 Application topically 2 (two) times a week.   minoxidil (ROGAINE) 2 % external solution Apply topically 2 (two) times daily.   OVER THE COUNTER MEDICATION Turmeric daily   rosuvastatin (CRESTOR) 5 MG tablet Take 1 tablet (5 mg total) by mouth daily. Take 5 mg by mouth daily.   tretinoin (RETIN-A) 0.05 % cream APPLY TO AFFECTED AREA EVERY DAY AT BEDTIME   triamcinolone (NASACORT) 55 MCG/ACT AERO nasal inhaler SPRAY 2 SPRAYS NASALLY DAILY   meclizine (ANTIVERT) 25 MG tablet 1/2-1 pill up to 3 times daily for motion sickness/dizziness (Patient not taking: Reported on 09/21/2022)   No current facility-administered medications for this visit. (Other)   REVIEW OF SYSTEMS: ROS   Positive for: Gastrointestinal, Eyes, Psychiatric, Heme/Lymph Negative for: Constitutional, Neurological, Skin, Genitourinary, Musculoskeletal, HENT, Endocrine, Cardiovascular, Respiratory, Allergic/Imm Last edited by Theodore Demark, COA on 09/28/2022  8:42 AM.     ALLERGIES Allergies  Allergen Reactions   Iodinated Contrast Media Anaphylaxis   Erythromycin Nausea And Vomiting   PAST MEDICAL HISTORY Past Medical History:  Diagnosis Date   ADD (attention deficit disorder)    Atypical nevus 01/11/1995   Medial Lower Abdomen - Slight   Basal cell carcinoma 09/07/2020   nod- left temporal scalp   BCC (basal cell carcinoma of skin) 04/30/2008   Right  Neck   Blurred vision    Cataract    Mixed form OU   Hemangioma    on liver   Seasonal allergies    Past Surgical History:  Procedure Laterality Date   BREAST EXCISIONAL BIOPSY Bilateral    BREAST SURGERY     biopsy x 3   CESAREAN SECTION     x 4   EYE SURGERY     FAMILY HISTORY Family History  Problem Relation Age of Onset   Hypertension Mother    SOCIAL HISTORY Social History   Tobacco Use   Smoking status: Never   Smokeless tobacco:  Never  Vaping Use   Vaping Use: Never used  Substance Use Topics   Alcohol use: Yes    Comment: glass wine most nights   Drug use: No       OPHTHALMIC EXAM:  Base Eye Exam     Visual Acuity (Snellen - Linear)       Right Left   Dist [place] 20/50 -1 20/40   Dist cc 20/25 -2 20/20 -1   Dist ph [place] 20/30 -1 20/25   Dist ph cc NI NI    Correction: Glasses  Did vision with and without driving glasses.        Tonometry (Tonopen, 8:36 AM)       Right Left   Pressure 18 17         Pupils       Dark Light Shape React APD   Right 3 2 Round Brisk None   Left 3 2 Round Brisk None         Visual Fields (Counting fingers)       Left Right    Full Full         Extraocular Movement       Right Left    Full, Ortho Full, Ortho         Neuro/Psych     Oriented x3: Yes   Mood/Affect: Normal         Dilation     Both eyes: 1.0% Mydriacyl, 2.5% Phenylephrine @ 8:36 AM           Slit Lamp and Fundus Exam     Slit Lamp Exam       Right Left   Lids/Lashes Dermatochalasis - upper lid Dermatochalasis - upper lid   Conjunctiva/Sclera White and quiet White and quiet   Cornea Arcus, 4 RK scars at 1200, 0300, 0600, and 0900, 1-2+ Punctate epithelial erosions, tear film debris Arcus, 1+ Punctate epithelial erosions, 4 RK scars at 1100, 0200, 0500, 0800   Anterior Chamber deep and clear Deep and quiet   Iris Round and dilated Round and dilated   Lens 2+ Nuclear sclerosis, 2-3+ Cortical cataract 2+ Nuclear sclerosis, 2-3+ Cortical cataract   Anterior Vitreous Vitreous syneresis, Posterior vitreous detachment, Weiss ring, no pigment, vitreous condensations Vitreous syneresis, Posterior vitreous detachment, Weiss ring, vitreous condensations         Fundus Exam       Right Left   Disc Pink and Sharp, Compact Pink and Sharp, Compact   C/D Ratio 0.3 0.4   Macula Flat, good foveal reflex, nasal epiretinal membrane with striae and mild central thickening - stable  from prior, No heme Flat, Good foveal reflex, mild Retinal pigment epithelial mottling, No heme or edema   Vessels mild attenuation, mild tortuosity mild attenuation, mild tortuosity, mild AV crossing changes   Periphery Attached, small hole with good laser  surrounding at 1100, No new RT/RD, No heme Attached, small horseshoe tear at 0900 - good laser changes surrounding, No new RT/RD           Refraction     Wearing Rx       Sphere Cylinder Axis Add   Right +1.50 +0.75 064 +0.00   Left +1.75 +0.50 034 +0.00            IMAGING AND PROCEDURES  Imaging and Procedures for 11/17/17  OCT, Retina - OU - Both Eyes       Right Eye Quality was good. Central Foveal Thickness: 463. Progression has been stable. Findings include no IRF, no SRF, abnormal foveal contour, epiretinal membrane, macular pucker, preretinal fibrosis (Focal ERM nasal macula and fovea with pucker and PRF, thickening of nasal fovea -- stable from prior).   Left Eye Quality was good. Central Foveal Thickness: 250. Progression has been stable. Findings include normal foveal contour, no IRF, no SRF.   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 CME - cystoid macular edema. PED - pigment epithelial detachment. IRF - intraretinal fluid. SRF - subretinal fluid. EZ - ellipsoid zone. ERM - epiretinal membrane. ORA - outer retinal atrophy. ORT - outer retinal tubulation. SRHM - subretinal hyper-reflective material             ASSESSMENT/PLAN:    ICD-10-CM   1. Epiretinal membrane (ERM) of right eye  H35.371 OCT, Retina - OU - Both Eyes    2. Retinal tear of left eye  H33.312     3. History of repair of retinal tear by laser photocoagulation  Z98.890     4. Posterior vitreous detachment of both eyes   H43.813     5. Combined forms of age-related cataract of both eyes  H25.813     6. 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 Epiretinal membrane, OD -- stable - BCVA 20/25 -- stable - OCT shows stable nasal ERM with pucker, fibrosis and progression of central retinal thickening - no indication for surgery at this time - continue to monitor - f/u as scheduled, sooner prn  2. retinal tear, OS    - small horseshoe tear located at 0900 -- no SRF  - S/P laser retinopexy OS (03.11.19) - good laser changes  - no new tears or detachment on repeat 360 peripheral exam  - monitor  3. History of retinal tear OD  - 1100 oclock position  - s/p laser retinopexy with Dr. Zigmund Daniel  - stable  - continue to monitor  4. PVD OU  - prominent floaters OU (OS > OD)  - Discussed findings and prognosis  - history of RT OU as above - No new RT or RD on 360 exam  - Reviewed s/s of RT/RD  - Strict return precautions for any such RT/RD  signs/symptoms  5. Combined form age-related cataract OU   - The symptoms of cataract, surgical options, and treatments and risks were discussed with patient.  - discussed diagnosis and progression  - under the expert management of Dr. Lucita Ferrara  - clear from a retina standpoint to proceed with cataract surgery when pt and surgeon are ready   Ophthalmic Meds Ordered this visit:  No orders of the defined types were placed in this encounter.    Return for as scheduled in October.  There are no Patient Instructions on file for this visit.  This document serves as a record of services  personally performed by Gardiner Sleeper, MD, PhD. It was created on their behalf by Orvan Falconer, an ophthalmic technician. The creation of this record is the provider's dictation and/or activities during the visit.    Electronically signed by: Orvan Falconer, OA, 09/30/22  2:01 AM  This document serves as a record of services personally performed by Gardiner Sleeper, MD, PhD. It was created on their behalf by San Jetty. Owens Shark, OA an ophthalmic technician. The creation of this record is the provider's dictation and/or activities during the visit.    Electronically signed by: San Jetty. Owens Shark, [place] 02.21.2024 2:01 AM  Gardiner Sleeper, M.D., Ph.D. Diseases & Surgery of the Retina and Vitreous [facility]  I have reviewed the above documentation for accuracy and completeness, and I agree with the above. Gardiner Sleeper, M.D., Ph.D. 09/30/22 2:07 AM  Abbreviations: M myopia (nearsighted); A astigmatism; H hyperopia (farsighted); P presbyopia; Mrx spectacle prescription;  CTL contact lenses; OD right eye; OS left eye; OU both eyes  XT exotropia; ET esotropia; PEK punctate epithelial keratitis; PEE punctate epithelial erosions; DES dry eye syndrome; MGD meibomian gland dysfunction; ATs artificial tears; PFAT's preservative free artificial tears; [place] nuclear sclerotic cataract; PSC posterior subcapsular cataract; ERM epi-retinal membrane; PVD posterior vitreous detachment; RD retinal detachment; DM diabetes mellitus; DR diabetic retinopathy; NPDR non-proliferative diabetic retinopathy; PDR proliferative diabetic retinopathy; CSME clinically significant macular edema; DME diabetic macular edema; dbh dot blot hemorrhages; CWS cotton wool spot; POAG primary open angle glaucoma; C/D cup-to-disc ratio; HVF humphrey visual field; GVF goldmann visual field; OCT optical coherence tomography; IOP intraocular pressure; BRVO Branch retinal vein occlusion; CRVO central retinal vein occlusion;  CRAO central retinal artery occlusion; BRAO branch retinal artery occlusion; RT retinal tear; SB scleral buckle; PPV pars plana vitrectomy; VH Vitreous hemorrhage; PRP panretinal laser photocoagulation; IVK intravitreal kenalog; VMT vitreomacular traction; MH Macular hole;  NVD neovascularization of the disc; NVE neovascularization elsewhere; AREDS age related eye disease study; ARMD age related macular degeneration; POAG primary open angle glaucoma; EBMD epithelial/anterior basement membrane dystrophy; ACIOL anterior chamber intraocular lens; IOL intraocular lens; PCIOL posterior chamber intraocular lens; Phaco/IOL phacoemulsification with intraocular lens placement; [place] photorefractive keratectomy; LASIK laser assisted in situ keratomileusis; HTN hypertension; DM diabetes mellitus; COPD chronic obstructive pulmonary disease

## 2022-09-28 ENCOUNTER — Encounter (INDEPENDENT_AMBULATORY_CARE_PROVIDER_SITE_OTHER): Payer: Self-pay | Admitting: Ophthalmology

## 2022-09-28 ENCOUNTER — Ambulatory Visit (INDEPENDENT_AMBULATORY_CARE_PROVIDER_SITE_OTHER): Payer: 59 | Admitting: Ophthalmology

## 2022-09-28 DIAGNOSIS — H25813 Combined forms of age-related cataract, bilateral: Secondary | ICD-10-CM

## 2022-09-28 DIAGNOSIS — H33312 Horseshoe tear of retina without detachment, left eye: Secondary | ICD-10-CM

## 2022-09-28 DIAGNOSIS — H35371 Puckering of macula, right eye: Secondary | ICD-10-CM | POA: Diagnosis not present

## 2022-09-28 DIAGNOSIS — H43813 Vitreous degeneration, bilateral: Secondary | ICD-10-CM

## 2022-09-28 DIAGNOSIS — Z9889 Other specified postprocedural states: Secondary | ICD-10-CM | POA: Diagnosis not present

## 2022-09-28 DIAGNOSIS — H35341 Macular cyst, hole, or pseudohole, right eye: Secondary | ICD-10-CM

## 2022-09-30 ENCOUNTER — Encounter (INDEPENDENT_AMBULATORY_CARE_PROVIDER_SITE_OTHER): Payer: Self-pay | Admitting: Ophthalmology

## 2022-12-08 DIAGNOSIS — D2272 Melanocytic nevi of left lower limb, including hip: Secondary | ICD-10-CM | POA: Diagnosis not present

## 2022-12-08 DIAGNOSIS — L814 Other melanin hyperpigmentation: Secondary | ICD-10-CM | POA: Diagnosis not present

## 2022-12-08 DIAGNOSIS — D2271 Melanocytic nevi of right lower limb, including hip: Secondary | ICD-10-CM | POA: Diagnosis not present

## 2022-12-08 DIAGNOSIS — L821 Other seborrheic keratosis: Secondary | ICD-10-CM | POA: Diagnosis not present

## 2022-12-08 DIAGNOSIS — L738 Other specified follicular disorders: Secondary | ICD-10-CM | POA: Diagnosis not present

## 2022-12-08 DIAGNOSIS — L648 Other androgenic alopecia: Secondary | ICD-10-CM | POA: Diagnosis not present

## 2022-12-23 ENCOUNTER — Other Ambulatory Visit: Payer: Self-pay | Admitting: Internal Medicine

## 2022-12-23 DIAGNOSIS — Z1231 Encounter for screening mammogram for malignant neoplasm of breast: Secondary | ICD-10-CM

## 2023-01-13 DIAGNOSIS — N3020 Other chronic cystitis without hematuria: Secondary | ICD-10-CM | POA: Diagnosis not present

## 2023-01-20 ENCOUNTER — Ambulatory Visit
Admission: RE | Admit: 2023-01-20 | Discharge: 2023-01-20 | Disposition: A | Discharge status: Home / Self Care | Payer: 59 | Source: Ambulatory Visit | Attending: Internal Medicine | Admitting: Internal Medicine

## 2023-01-20 DIAGNOSIS — Z1231 Encounter for screening mammogram for malignant neoplasm of breast: Secondary | ICD-10-CM | POA: Diagnosis not present

## 2023-01-23 DIAGNOSIS — N952 Postmenopausal atrophic vaginitis: Secondary | ICD-10-CM | POA: Diagnosis not present

## 2023-01-23 DIAGNOSIS — N3020 Other chronic cystitis without hematuria: Secondary | ICD-10-CM | POA: Diagnosis not present

## 2023-01-24 DIAGNOSIS — H31003 Unspecified chorioretinal scars, bilateral: Secondary | ICD-10-CM | POA: Diagnosis not present

## 2023-01-26 ENCOUNTER — Ambulatory Visit: Payer: 59 | Admitting: Nurse Practitioner

## 2023-01-27 ENCOUNTER — Ambulatory Visit (INDEPENDENT_AMBULATORY_CARE_PROVIDER_SITE_OTHER): Payer: 59 | Admitting: Nurse Practitioner

## 2023-01-27 ENCOUNTER — Encounter: Payer: Self-pay | Admitting: Nurse Practitioner

## 2023-01-27 VITALS — BP 114/68 | HR 85 | Temp 97.7°F | Ht 61.75 in | Wt 117.6 lb

## 2023-01-27 DIAGNOSIS — R0781 Pleurodynia: Secondary | ICD-10-CM | POA: Diagnosis not present

## 2023-01-27 DIAGNOSIS — N644 Mastodynia: Secondary | ICD-10-CM | POA: Diagnosis not present

## 2023-01-27 DIAGNOSIS — R0789 Other chest pain: Secondary | ICD-10-CM | POA: Diagnosis not present

## 2023-01-27 NOTE — Progress Notes (Signed)
Assessment and Plan:  CARIN ASKELAND was seen today for an episodic visit.  Diagnoses and all order for this visit:  Mid-sternal chest pain Most likely costochondritis Continue anti-inflammatory Obtain chest x-ray to a evaluate for any rib fracture considering Osteopenic, last T-Score -3.5. Follows Cardiology - review of ECHO grossly normal. EKG 09/2022 NSR  Breast pain, left Review of mammogram 01/2023 normal Continue anti-inflammatory as directed Assess for possible rib fracture   Rib pain Chest and Rib X-ray  Report to ER for any increase in stroke like symptoms, including HA, N/V, paralysis, difficulty speaking, trouble walking, confusion, vision changes, CP, heart palpitations, SOB, diaphoresis.   Notify office for further evaluation and treatment, questions or concerns if s/s fail to improve. The risks and benefits of my recommendations, as well as other treatment options were discussed with the patient today. Questions were answered.  Further disposition pending results of labs. Discussed med's effects and SE's.    Over 25 minutes of exam, counseling, chart review, and critical decision making was performed.   Future Appointments  Date Time Provider Department Center  04/04/2023  9:00 AM Raynelle Dick, NP [id number] None  05/15/2023  1:30 PM Rennis Chris, MD [facility] None    ------------------------------------------------------------------------------------------------------------------   HPI BP 114/68   Pulse 85   Temp 97.7 F (36.5 C)   Ht 5' 1.75" (1.568 m)   Wt 117 lb 9.6 oz (53.3 kg)   LMP 05/11/2012   SpO2 96%   BMI 21.68 kg/m   62 y.o.female presents for evaluation of mid-sternal chest pain.  States this pain has been present intermittently for the last several months.  Has been treated in the past for costochondritis pain which she feels may be similar. Associated SOB.  She had a recent mammogram 01/2023 which was negative.  She has followed with  Cardiology in the past, most recent visit 09/2022 where ECHO was grossly normal and EKG revealed NSR.  Discussion was had for a holter monitor at that time but was deferred.  She denies any recent fall or injury the the area.   Past Medical History:  Diagnosis Date   ADD (attention deficit disorder)    Atypical nevus 01/11/1995   Medial Lower Abdomen - Slight   Basal cell carcinoma 09/07/2020   nod- left temporal scalp   BCC (basal cell carcinoma of skin) 04/30/2008   Right Neck   Blurred vision    Cataract    Mixed form OU   Hemangioma    on liver   Seasonal allergies      Allergies  Allergen Reactions   Iodinated Contrast Media Anaphylaxis   Erythromycin Nausea And Vomiting    Current Outpatient Medications on File Prior to Visit  Medication Sig   alendronate (FOSAMAX) 70 MG tablet Take 1 tablet (70 mg total) by mouth every 7 (seven) days. Take with a full glass of water on an empty stomach. Sit upright for 30 minutes   Cholecalciferol 125 MCG (5000 UT) capsule Take 5,000 Units by mouth daily.   Cyanocobalamin (VITAMIN B 12 PO) Take by mouth daily.   cycloSPORINE (RESTASIS) 0.05 % ophthalmic emulsion Place 1 drop into both eyes 2 (two) times daily.   hydroquinone 4 % cream APPLY TOPICALLY TWICE A DAY   ketoconazole (NIZORAL) 2 % shampoo Apply 1 Application topically 2 (two) times a week.   minoxidil (ROGAINE) 2 % external solution Apply topically 2 (two) times daily.   OVER THE COUNTER MEDICATION Turmeric daily  rosuvastatin (CRESTOR) 5 MG tablet Take 1 tablet (5 mg total) by mouth daily. Take 5 mg by mouth daily.   tretinoin (RETIN-A) 0.05 % cream APPLY TO AFFECTED AREA EVERY DAY AT BEDTIME   triamcinolone (NASACORT) 55 MCG/ACT AERO nasal inhaler SPRAY 2 SPRAYS NASALLY DAILY   amoxicillin (AMOXIL) 500 MG capsule Take 500 mg by mouth 3 (three) times daily. (Patient not taking: Reported on 01/27/2023)   EQ ALLERGY RELIEF, CETIRIZINE, 10 MG tablet TAKE 1 TABLET BY MOUTH ONCE  DAILY FOR ALLERGIES (Patient not taking: Reported on 01/27/2023)   meclizine (ANTIVERT) 25 MG tablet 1/2-1 pill up to 3 times daily for motion sickness/dizziness (Patient not taking: Reported on 09/21/2022)   No current facility-administered medications on file prior to visit.    ROS: all negative except what is noted in the HPI.   Physical Exam:  BP 114/68   Pulse 85   Temp 97.7 F (36.5 C)   Ht 5' 1.75" (1.568 m)   Wt 117 lb 9.6 oz (53.3 kg)   LMP 05/11/2012   SpO2 96%   BMI 21.68 kg/m   General Appearance: NAD.  Awake, conversant and cooperative. Eyes: PERRLA, EOMs intact.  Sclera white.  Conjunctiva without erythema. Sinuses: No frontal/maxillary tenderness.  No nasal discharge. Nares patent.  ENT/Mouth: Ext aud canals clear.  Bilateral TMs w/DOL and without erythema or bulging. Hearing intact.  Posterior pharynx without swelling or exudate.  Tonsils without swelling or erythema.  Neck: Supple.  No masses, nodules or thyromegaly. Respiratory: Tender to palpation along left sternal border. Effort is regular with non-labored breathing. Breath sounds are equal bilaterally without rales, rhonchi, wheezing or stridor.  Cardio: RRR with no MRGs. Brisk peripheral pulses without edema.  Abdomen: Active BS in all four quadrants.  Soft and non-tender without guarding, rebound tenderness, hernias or masses. Lymphatics: Non tender without lymphadenopathy.  Musculoskeletal: Full ROM, 5/5 strength, normal ambulation.  No clubbing or cyanosis. Skin: Appropriate color for ethnicity. Warm without rashes, lesions, ecchymosis, ulcers.  Neuro: CN II-XII grossly normal. Normal muscle tone without cerebellar symptoms and intact sensation.   Psych: AO X 3,  appropriate mood and affect, insight and judgment.    EKG:  NSR  Adela Glimpse, NP 12:14 PM [facility]

## 2023-01-30 ENCOUNTER — Encounter: Payer: Self-pay | Admitting: Nurse Practitioner

## 2023-01-30 ENCOUNTER — Ambulatory Visit
Admission: RE | Admit: 2023-01-30 | Discharge: 2023-01-30 | Disposition: A | Discharge status: Home / Self Care | Payer: 59 | Source: Ambulatory Visit | Attending: Nurse Practitioner | Admitting: Nurse Practitioner

## 2023-01-30 ENCOUNTER — Other Ambulatory Visit: Payer: Self-pay | Admitting: Nurse Practitioner

## 2023-01-30 DIAGNOSIS — N644 Mastodynia: Secondary | ICD-10-CM

## 2023-01-30 DIAGNOSIS — R0781 Pleurodynia: Secondary | ICD-10-CM

## 2023-01-30 DIAGNOSIS — R0789 Other chest pain: Secondary | ICD-10-CM

## 2023-02-06 ENCOUNTER — Encounter: Payer: Self-pay | Admitting: Nurse Practitioner

## 2023-02-08 ENCOUNTER — Other Ambulatory Visit: Payer: Self-pay | Admitting: Nurse Practitioner

## 2023-03-07 DIAGNOSIS — N3020 Other chronic cystitis without hematuria: Secondary | ICD-10-CM | POA: Diagnosis not present

## 2023-03-24 ENCOUNTER — Encounter: Payer: 59 | Admitting: Nurse Practitioner

## 2023-04-03 NOTE — Progress Notes (Unsigned)
Complete Physical  Assessment and Plan: Amanda Salazar was seen today for annual exam.  Diagnoses and all orders for this visit:  Encounter for general adult medical examination with abnormal findings       - EKG       - Due annually Mammogram negative 01/20/23 DEXA osteoporosis T score - 3.5 04/28/22 Colonoscopy 05/07/21 due 2032  Osteoporosis Currently on Fosamax 70 mg once a week Continue Vit D and weight bearing exercises.   Hyperlipidemia, unspecified hyperlipidemia type       -  Continue diet and exercise and Crestor 5 mg  -     COMPLETE METABOLIC PANEL WITH GFR -     Lipid panel  Attention deficit disorder, unspecified hyperactivity presence       - Currently not on medication, controlled with behavior modification  Generalized anxiety disorder  Currently controlled with diet, exercise and behavior modification  BMI 21 Continue diet and exercise- encourage increased lean protein and weights  Gastroesophageal reflux disease, unspecified whether esophagitis present - Denies current symptoms and is not on medication.  Will continue to monitor - Magnesium  Basal cell carcinoma (BCC), unspecified site       -  Follow with Dermatology  Emphysema (found on xray 01/2023)(hcc) Asymptomatic Will monitor  Vitamin D deficiency Continue Vit D supplementation to maintain value in therapeutic level of 60-100  -     VITAMIN D 25 Hydroxy (Vit-D Deficiency, Fractures)  Breast Pain left Cardiology work up and X rays have been negative Pain on exam is in area where underwire of bra is hitting.  Advised to stop wearing underwire bras.  Notify office if symptoms do not resolve  Recurrent UTI Followed by [facility] Uses Macrobid as needed Uses vaginal estrogen cream for atrophy.   Medication management -     CBC with Differential/Platelet -     COMPLETE METABOLIC PANEL WITH GFR -     Lipid panel -     TSH -     Hemoglobin A1c -     VITAMIN D 25 Hydroxy (Vit-D Deficiency,  Fractures) -     Magnesium -     EKG 12-Lead -     Urinalysis, Routine w reflex microscopic -     Microalbumin / creatinine urine ratio   Screening for hematuria or proteinuria -     Urinalysis, Routine w reflex microscopic -     Microalbumin / creatinine urine ratio  Screening for diabetes mellitus -     Hemoglobin A1c  Screening for thyroid disorder -     TSH  Screening for ischemic heart disease -     EKG 12-Lead  Screening for AAA - U/S ABD Retroperitoneal LTD   Future Appointments  Date Time Provider Department Center  05/15/2023  1:30 PM Rennis Chris, MD [facility] None  04/03/2024  9:00 AM Raynelle Dick, NP [facility] None    Discussed med's effects and SE's. Screening labs and tests as requested with regular follow-up as recommended.  HPI 62 y.o. female  presents for a complete physical, has Hyperlipidemia; Allergic rhinitis; GERD; Dense breast tissue; Basal cell carcinoma; Generalized anxiety disorder; ADD (attention deficit disorder); Age-related osteoporosis without current pathological fracture; Vitamin D deficiency; Medication management; and Vertigo on their problem list.    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 She may be going full time for this position.   Emphysema on rib xray 01/30/23- no lung symptoms noticed.  Never worked around Proofreader, never smoked, and parents never smoked.   She will get occasionally have pain in left breast but resolves spontaneously.  Has been evaluated by cardio and xray's, mammogram all were normal. She has continued to notice a pain on lateral side of left breast, occurs intermittently.   She has noticed a small raised are on vulva that is nontender . Has felt x several months when applying estrogen vaginal cream  [facility] for [facility]TI-has Macrobid to use as needed for flares.  BMI is Body mass index is 21.97 kg/m.,.She has not been doing as much  exercise. Wt Readings from Last 3 Encounters:  04/04/23 118 lb 3.2 oz (53.6 kg)  01/27/23 117 lb 9.6 oz (53.3 kg)  09/26/22 118 lb 3.2 oz (53.6 kg)    Her blood pressure has been controlled at home, today their BP is BP: 98/66  BP Readings from Last 3 Encounters:  04/04/23 98/66  01/27/23 114/68  09/26/22 112/62  She does not workout. She denies chest pain, shortness of breath, dizziness.   She is on cholesterol medication, crestor 5 mg was started after last visit and denies myalgias. Her cholesterol is not at goal. The cholesterol last visit was:   Lab Results  Component Value Date   CHOL 142 09/26/2022   HDL 66 09/26/2022   LDLCALC 59 09/26/2022   LDLDIRECT 151.2 01/03/2008   TRIG 88 09/26/2022   CHOLHDL 2.2 09/26/2022   Last Z6X in the office was:  Lab Results  Component Value Date   HGBA1C 4.9 03/23/2022   Patient is on Vitamin D supplement 5000 units daily.  Lab Results  Component Value Date   VD25OH 80 03/23/2022   Vertigo is occurring intermittent. Not taking Meclizine.      Current Medications:   Current Outpatient Medications (Endocrine & Metabolic):    alendronate (FOSAMAX) 70 MG tablet, Take 1 tablet (70 mg total) by mouth every 7 (seven) days. Take with a full glass of water on an empty stomach. Sit upright for 30 minutes  Current Outpatient Medications (Cardiovascular):    rosuvastatin (CRESTOR) 5 MG tablet, TAKE 1 TABLET BY MOUTH EVERY DAY  Current Outpatient Medications (Respiratory):    EQ ALLERGY RELIEF, CETIRIZINE, 10 MG tablet, TAKE 1 TABLET BY MOUTH ONCE DAILY FOR ALLERGIES (Patient not taking: Reported on 01/27/2023)   triamcinolone (NASACORT) 55 MCG/ACT AERO nasal inhaler, SPRAY 2 SPRAYS NASALLY DAILY   Current Outpatient Medications (Hematological):    Cyanocobalamin (VITAMIN B 12 PO), Take by mouth daily.  Current Outpatient Medications (Other):    amoxicillin (AMOXIL) 500 MG capsule, Take 500 mg by mouth 3 (three) times daily. (Patient  not taking: Reported on 01/27/2023)   Cholecalciferol 125 MCG (5000 UT) capsule, Take 5,000 Units by mouth daily.   cycloSPORINE (RESTASIS) 0.05 % ophthalmic emulsion, Place 1 drop into both eyes 2 (two) times daily.   hydroquinone 4 % cream, APPLY TOPICALLY TWICE A DAY   ketoconazole (NIZORAL) 2 % shampoo, Apply 1 Application topically 2 (two) times a week.   meclizine (ANTIVERT) 25 MG tablet, 1/2-1 pill up to 3 times daily for motion sickness/dizziness (Patient not taking: Reported on 09/21/2022)   minoxidil (ROGAINE) 2 % external solution, Apply topically 2 (two) times daily.   OVER THE COUNTER MEDICATION, Turmeric daily   tretinoin (RETIN-A) 0.05 % cream, APPLY TO AFFECTED AREA EVERY DAY AT BEDTIME  Health Maintenance:   Immunization History  Administered Date(s) Administered   Influenza  Inj Mdck Quad Pf 09/29/2016   PFIZER(Purple Top)SARS-COV-2 Vaccination 11/07/2019, 12/02/2019   Tdap 04/16/2012     Medical History:  Past Medical History:  Diagnosis Date   ADD (attention deficit disorder)    Atypical nevus 01/11/1995   Medial Lower Abdomen - Slight   Basal cell carcinoma 09/07/2020   nod- left temporal scalp   BCC (basal cell carcinoma of skin) 04/30/2008   Right Neck   Blurred vision    Cataract    Mixed form OU   Hemangioma    on liver   Seasonal allergies    Allergies Allergies  Allergen Reactions   Iodinated Contrast Media Anaphylaxis   Erythromycin Nausea And Vomiting    SURGICAL HISTORY She  has a past surgical history that includes Breast surgery; Cesarean section; Eye surgery; and Breast excisional biopsy (Bilateral).   FAMILY HISTORY Her family history includes Hypertension in her mother.   SOCIAL HISTORY She  reports that she has never smoked. She has never used smokeless tobacco. She reports current alcohol use. She reports that she does not use drugs.  Review of Systems  Constitutional: Negative.  Negative for chills and fever.  HENT: Negative.   Negative for congestion, hearing loss, sinus pain, sore throat and tinnitus.   Eyes: Negative.  Negative for blurred vision and double vision.  Respiratory: Negative.  Negative for cough, hemoptysis, sputum production, shortness of breath and wheezing.   Cardiovascular: Negative.  Negative for chest pain, palpitations and leg swelling.  Gastrointestinal: Negative.  Negative for abdominal pain, constipation, diarrhea, heartburn, nausea and vomiting.  Genitourinary: Negative.  Negative for dysuria and urgency.  Musculoskeletal: Negative.  Negative for back pain, falls, joint pain, myalgias and neck pain.       Pain lateral side of left breast intermittent  Skin: Negative.  Negative for rash.  Neurological:  Positive for dizziness (when rolls over when lying flat). Negative for tingling, tremors, sensory change, speech change, focal weakness, seizures, loss of consciousness, weakness and headaches.  Endo/Heme/Allergies: Negative.  Does not bruise/bleed easily.  Psychiatric/Behavioral:  Negative for depression, hallucinations, memory loss, substance abuse and suicidal ideas. The patient is nervous/anxious and has insomnia.      Physical Exam: Estimated body mass index is 21.97 kg/m as calculated from the following:   Height as of this encounter: 5' 1.5" (1.562 m).   Weight as of this encounter: 118 lb 3.2 oz (53.6 kg). BP 98/66   Temp 97.9 F (36.6 C)   Ht 5' 1.5" (1.562 m)   Wt 118 lb 3.2 oz (53.6 kg)   LMP 05/11/2012   BMI 21.97 kg/m  General Appearance: Well nourished, in no apparent distress. Eyes: PERRLA, EOMs, conjunctiva no swelling or erythema Sinuses: No Frontal/maxillary tenderness ENT/Mouth: Ext aud canals clear, normal light reflex with TMs without erythema, bulging.  Good dentition. No erythema, swelling, or exudate on post pharynx. Tonsils not swollen or erythematous. Hearing normal.  Neck: Supple, thyroid normal. No bruits Respiratory: Respiratory effort normal, BS equal  bilaterally without rales, rhonchi, wheezing or stridor. Cardio: RRR without murmurs, rubs or gallops. Brisk peripheral pulses without edema.  Chest: symmetric, with normal excursions and percussion. Breasts: breasts appear normal, no suspicious masses, no skin or nipple changes or axillary nodes. Tenderness lateral left breast, no mass- where underwire of bra sits Abdomen: Soft, +BS. Non tender, no guarding, rebound, hernias, masses, or organomegaly. .  Lymphatics: Non tender without lymphadenopathy.  Musculoskeletal: Full ROM all peripheral extremities,5/5 strength, and normal gait. Skin: Warm,  dry without rashes, lesions, ecchymosis.  Neuro: Cranial nerves intact, reflexes equal bilaterally. Normal muscle tone, no cerebellar symptoms. Sensation intact.  Psych: Awake and oriented X 3, normal affect, Insight and Judgment appropriate.  Vulva- small skin tag noted below introitus  EKG:  NSR ,no ST changes AAA: < 3 cm  [facility]  04/04/2023

## 2023-04-04 ENCOUNTER — Ambulatory Visit (INDEPENDENT_AMBULATORY_CARE_PROVIDER_SITE_OTHER): Payer: 59 | Admitting: Nurse Practitioner

## 2023-04-04 ENCOUNTER — Encounter: Payer: Self-pay | Admitting: Nurse Practitioner

## 2023-04-04 VITALS — BP 98/66 | HR 76 | Temp 97.9°F | Ht 61.5 in | Wt 118.2 lb

## 2023-04-04 DIAGNOSIS — Z1329 Encounter for screening for other suspected endocrine disorder: Secondary | ICD-10-CM | POA: Diagnosis not present

## 2023-04-04 DIAGNOSIS — M810 Age-related osteoporosis without current pathological fracture: Secondary | ICD-10-CM | POA: Insufficient documentation

## 2023-04-04 DIAGNOSIS — Z136 Encounter for screening for cardiovascular disorders: Secondary | ICD-10-CM | POA: Diagnosis not present

## 2023-04-04 DIAGNOSIS — F411 Generalized anxiety disorder: Secondary | ICD-10-CM

## 2023-04-04 DIAGNOSIS — Z6821 Body mass index (BMI) 21.0-21.9, adult: Secondary | ICD-10-CM

## 2023-04-04 DIAGNOSIS — I700 Atherosclerosis of aorta: Secondary | ICD-10-CM

## 2023-04-04 DIAGNOSIS — F988 Other specified behavioral and emotional disorders with onset usually occurring in childhood and adolescence: Secondary | ICD-10-CM

## 2023-04-04 DIAGNOSIS — I10 Essential (primary) hypertension: Secondary | ICD-10-CM | POA: Diagnosis not present

## 2023-04-04 DIAGNOSIS — R42 Dizziness and giddiness: Secondary | ICD-10-CM | POA: Insufficient documentation

## 2023-04-04 DIAGNOSIS — Z131 Encounter for screening for diabetes mellitus: Secondary | ICD-10-CM | POA: Diagnosis not present

## 2023-04-04 DIAGNOSIS — Z0000 Encounter for general adult medical examination without abnormal findings: Secondary | ICD-10-CM

## 2023-04-04 DIAGNOSIS — E785 Hyperlipidemia, unspecified: Secondary | ICD-10-CM

## 2023-04-04 DIAGNOSIS — C4491 Basal cell carcinoma of skin, unspecified: Secondary | ICD-10-CM

## 2023-04-04 DIAGNOSIS — K219 Gastro-esophageal reflux disease without esophagitis: Secondary | ICD-10-CM

## 2023-04-04 DIAGNOSIS — N390 Urinary tract infection, site not specified: Secondary | ICD-10-CM

## 2023-04-04 DIAGNOSIS — E559 Vitamin D deficiency, unspecified: Secondary | ICD-10-CM | POA: Diagnosis not present

## 2023-04-04 DIAGNOSIS — Z1389 Encounter for screening for other disorder: Secondary | ICD-10-CM | POA: Diagnosis not present

## 2023-04-04 DIAGNOSIS — N644 Mastodynia: Secondary | ICD-10-CM

## 2023-04-04 DIAGNOSIS — Z79899 Other long term (current) drug therapy: Secondary | ICD-10-CM | POA: Insufficient documentation

## 2023-04-04 DIAGNOSIS — J438 Other emphysema: Secondary | ICD-10-CM

## 2023-04-04 DIAGNOSIS — Z0001 Encounter for general adult medical examination with abnormal findings: Secondary | ICD-10-CM

## 2023-04-04 NOTE — Patient Instructions (Signed)

## 2023-04-05 LAB — COMPLETE METABOLIC PANEL WITH GFR
AG Ratio: 1.8 (calc) (ref 1.0–2.5)
ALT: 19 U/L (ref 6–29)
AST: 20 U/L (ref 10–35)
Albumin: 4.4 g/dL (ref 3.6–5.1)
Alkaline phosphatase (APISO): 74 U/L (ref 37–153)
BUN: 13 mg/dL (ref 7–25)
CO2: 27 mmol/L (ref 20–32)
Calcium: 9.3 mg/dL (ref 8.6–10.4)
Chloride: 105 mmol/L (ref 98–110)
Creat: 0.7 mg/dL (ref 0.50–1.05)
Globulin: 2.5 g/dL (ref 1.9–3.7)
Glucose, Bld: 93 mg/dL (ref 65–99)
Potassium: 4.3 mmol/L (ref 3.5–5.3)
Sodium: 141 mmol/L (ref 135–146)
Total Bilirubin: 0.5 mg/dL (ref 0.2–1.2)
Total Protein: 6.9 g/dL (ref 6.1–8.1)
eGFR: 97 mL/min/{1.73_m2} (ref >=60)

## 2023-04-05 LAB — URINALYSIS, ROUTINE W REFLEX MICROSCOPIC
Bilirubin Urine: NEGATIVE
Glucose, UA: NEGATIVE
Hgb urine dipstick: NEGATIVE
Ketones, ur: NEGATIVE
Leukocytes,Ua: NEGATIVE
Nitrite: NEGATIVE
Protein, ur: NEGATIVE
Specific Gravity, Urine: 1.005 (ref 1.001–1.035)
pH: 6.5 (ref 5.0–8.0)

## 2023-04-05 LAB — CBC WITH DIFFERENTIAL/PLATELET
Absolute Monocytes: 388 cells/uL (ref 200–950)
Basophils Absolute: 20 cells/uL (ref 0–200)
Basophils Relative: 0.4 %
Eosinophils Absolute: 158 {cells}/uL (ref 15–500)
Eosinophils Relative: 3.1 %
HCT: 40.2 % (ref 35.0–45.0)
Hemoglobin: 13.6 g/dL (ref 11.7–15.5)
Lymphs Abs: 1826 {cells}/uL (ref 850–3900)
MCH: 30.8 pg (ref 27.0–33.0)
MCHC: 33.8 g/dL (ref 32.0–36.0)
MCV: 91.2 fL (ref 80.0–100.0)
MPV: 10.7 fL (ref 7.5–12.5)
Monocytes Relative: 7.6 %
Neutro Abs: 2708 {cells}/uL (ref 1500–7800)
Neutrophils Relative %: 53.1 %
Platelets: 316 10*3/uL (ref 140–400)
RBC: 4.41 10*6/uL (ref 3.80–5.10)
RDW: 12.1 % (ref 11.0–15.0)
Total Lymphocyte: 35.8 %
WBC: 5.1 10*3/uL (ref 3.8–10.8)

## 2023-04-05 LAB — MICROALBUMIN / CREATININE URINE RATIO
Creatinine, Urine: 31 mg/dL (ref 20–275)
Microalb, Ur: <0.2 mg/dL

## 2023-04-05 LAB — HEMOGLOBIN A1C W/OUT EAG: Hgb A1c MFr Bld: 5.3 %{Hb} (ref <5.7)

## 2023-04-05 LAB — LIPID PANEL
Cholesterol: 176 mg/dL (ref <200)
HDL: 64 mg/dL (ref >=50)
LDL Cholesterol (Calc): 95 mg/dL
Non-HDL Cholesterol (Calc): 112 mg/dL (ref <130)
Total CHOL/HDL Ratio: 2.8 (calc) (ref <5.0)
Triglycerides: 82 mg/dL (ref <150)

## 2023-04-05 LAB — MAGNESIUM: Magnesium: 2.2 mg/dL (ref 1.5–2.5)

## 2023-04-05 LAB — TSH: TSH: 1.81 m[IU]/L (ref 0.40–4.50)

## 2023-04-05 LAB — VITAMIN D 25 HYDROXY (VIT D DEFICIENCY, FRACTURES): Vit D, 25-Hydroxy: 78 ng/mL (ref 30–100)

## 2023-04-06 ENCOUNTER — Encounter: Payer: Self-pay | Admitting: Internal Medicine

## 2023-04-06 DIAGNOSIS — E7800 Pure hypercholesterolemia, unspecified: Secondary | ICD-10-CM

## 2023-05-15 ENCOUNTER — Encounter (INDEPENDENT_AMBULATORY_CARE_PROVIDER_SITE_OTHER): Payer: 59 | Admitting: Ophthalmology

## 2023-05-18 NOTE — Progress Notes (Signed)
[facility] - Clinic Note  05/22/2023     CHIEF COMPLAINT Patient presents for Retina Follow Up    HISTORY OF PRESENT ILLNESS: Amanda BLASCHKE is a 62 y.o. female who presents to the clinic today for:   HPI     Retina Follow Up   In right eye.  This started 1 year ago.  Duration of 1 year.  Since onset it is gradually worsening.  I, the attending physician,  performed the HPI with the patient and updated documentation appropriately.        Comments   1 year retina follow up ERM OD pt is reporting vision is not as sharp pt saw [place] in the summer she got new glasses but she had trouble with seeing and had to go back for check she was given a new RX but has not had it made yet she has be having dryness using tyrvaya and systane she having floaters denies any flashes       Last edited by Rennis Chris, MD on 05/22/2023  3:57 PM.    Pt feels like her vision is always kind of greyish and splotchy, she states Dr. Delaney Meigs is ready to do cataract sx at any time, but she is a little nervous to do the surgery do to her RK, she states she saw Dr. [place] who told her she had very dry eye, she is using Tryvaya and Systane, she states she still sees floaters  Referring physician: Lucky Cowboy, MD [address] [place],  [place] [address]  HISTORICAL INFORMATION:   Selected notes from the MEDICAL RECORD NUMBER Referred by Dr. Ashley Royalty for possible RD LEE- 04.27.18 (JDM) [BCVA OD: 20/20-1 OS: 20/20-1]   CURRENT MEDICATIONS: Current Outpatient Medications (Ophthalmic Drugs)  Medication Sig   cycloSPORINE (RESTASIS) 0.05 % ophthalmic emulsion Place 1 drop into both eyes 2 (two) times daily.   No current facility-administered medications for this visit. (Ophthalmic Drugs)   Current Outpatient Medications (Other)  Medication Sig   alendronate (FOSAMAX) 70 MG tablet Take 1 tablet (70 mg total) by mouth every 7 (seven) days. Take with a full glass  of water on an empty stomach. Sit upright for 30 minutes   Cholecalciferol 125 MCG (5000 UT) capsule Take 5,000 Units by mouth daily.   Cyanocobalamin (VITAMIN B 12 PO) Take by mouth daily.   estradiol (ESTRACE) 0.1 MG/GM vaginal cream Place vaginally.   hydroquinone 4 % cream APPLY TOPICALLY TWICE A DAY (Patient taking differently: In Fall)   ketoconazole (NIZORAL) 2 % shampoo Apply 1 Application topically 2 (two) times a week.   minoxidil (ROGAINE) 2 % external solution Apply topically 2 (two) times daily.   nitrofurantoin, macrocrystal-monohydrate, (MACROBID) 100 MG capsule As Needed   OVER THE COUNTER MEDICATION Turmeric daily   OVER THE COUNTER MEDICATION Raw Flora   rosuvastatin (CRESTOR) 5 MG tablet TAKE 1 TABLET BY MOUTH EVERY DAY   tretinoin (RETIN-A) 0.05 % cream APPLY TO AFFECTED AREA EVERY DAY AT BEDTIME (Patient taking differently: In Fall)   triamcinolone (NASACORT) 55 MCG/ACT AERO nasal inhaler SPRAY 2 SPRAYS NASALLY DAILY   No current facility-administered medications for this visit. (Other)   REVIEW OF SYSTEMS: ROS   Positive for: Gastrointestinal, Eyes, Psychiatric, Heme/Lymph Negative for: Constitutional, Neurological, Skin, Genitourinary, Musculoskeletal, HENT, Endocrine, Cardiovascular, Respiratory, Allergic/Imm Last edited by Etheleen Mayhew, COT on 05/22/2023  3:13 PM.      ALLERGIES Allergies  Allergen Reactions   Iodinated Contrast  Media Anaphylaxis   Erythromycin Nausea And Vomiting   PAST MEDICAL HISTORY Past Medical History:  Diagnosis Date   ADD (attention deficit disorder)    Atypical nevus 01/11/1995   Medial Lower Abdomen - Slight   Basal cell carcinoma 09/07/2020   nod- left temporal scalp   BCC (basal cell carcinoma of skin) 04/30/2008   Right Neck   Blurred vision    Cataract    Mixed form OU   Hemangioma    on liver   Seasonal allergies    Past Surgical History:  Procedure Laterality Date   BREAST EXCISIONAL BIOPSY Bilateral     BREAST SURGERY     biopsy x 3   CESAREAN SECTION     x 4   EYE SURGERY     FAMILY HISTORY Family History  Problem Relation Age of Onset   Hypertension Mother    SOCIAL HISTORY Social History   Tobacco Use   Smoking status: Never   Smokeless tobacco: Never  Vaping Use   Vaping status: Never Used  Substance Use Topics   Alcohol use: Yes    Comment: glass wine most nights   Drug use: No       OPHTHALMIC EXAM:  Base Eye Exam     Visual Acuity (Snellen - Linear)       Right Left   Dist cc 20/25 -1 20/25   Dist ph cc NI NI    Correction: Glasses         Tonometry (Tonopen, 3:17 PM)       Right Left   Pressure 14 16         Pupils       Pupils Dark Light Shape React APD   Right PERRL 3 2 Round Brisk None   Left PERRL 3 2 Round Brisk None         Visual Fields       Left Right    Full Full         Extraocular Movement       Right Left    Full, Ortho Full, Ortho         Neuro/Psych     Oriented x3: Yes   Mood/Affect: Normal         Dilation     Both eyes: 2.5% Phenylephrine @ 3:17 PM           Slit Lamp and Fundus Exam     Slit Lamp Exam       Right Left   Lids/Lashes Dermatochalasis - upper lid Dermatochalasis - upper lid   Conjunctiva/Sclera White and quiet White and quiet   Cornea Arcus, 4 RK scars at 1200, 0300, 0600, and 0900, 1+ Punctate epithelial erosions, tear film debris Arcus, 1+ Punctate epithelial erosions, 4 RK scars at 1100, 0200, 0500, 0800   Anterior Chamber deep, clear, narrow temporal angle clear, narrow temporal angle   Iris Round and dilated Round and dilated   Lens 2+ Nuclear sclerosis, 2-3+ Cortical cataract 2+ Nuclear sclerosis, 2-3+ Cortical cataract   Anterior Vitreous Vitreous syneresis, Posterior vitreous detachment, Weiss ring, no pigment, vitreous condensations Vitreous syneresis, Posterior vitreous detachment, Weiss ring, vitreous condensations         Fundus Exam       Right Left    Disc Pink and Sharp, Compact Pink and Sharp, Compact   C/D Ratio 0.3 0.4   Macula Flat, good foveal reflex, nasal epiretinal membrane with striae and mild central thickening - stable  from prior, No heme Flat, Good foveal reflex, mild Retinal pigment epithelial mottling, No heme or edema   Vessels attenuated, mild tortuosity attenuated, Tortuous   Periphery Attached, small hole with good laser surrounding at 1100, No new RT/RD, No heme Attached, small horseshoe tear at 0900 - good laser changes surrounding, No new RT/RD           Refraction     Wearing Rx       Sphere Cylinder Axis Add   Right +1.50 +0.75 064 +0.00   Left +1.75 +0.50 034 +0.00            IMAGING AND PROCEDURES  Imaging and Procedures for 11/17/17  OCT, Retina - OU - Both Eyes       Right Eye Quality was good. Central Foveal Thickness: 472. Progression has been stable. 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   Left Eye Quality was good. Central Foveal Thickness: 245. Progression has been stable. Findings include normal foveal contour, no IRF, no SRF.   Notes *Images captured and stored on drive  Diagnosis / Impression:  OD: Focal ERM nasal macula and fovea with pucker and PRF, thickening centrally and nasal fovea -- stable from prior OS: NFP, No IRF/SRF  Clinical management:  See below  Abbreviations: NFP - Normal foveal profile. CME - cystoid macular edema. PED - pigment epithelial detachment. IRF - intraretinal fluid. SRF - subretinal fluid. EZ - ellipsoid zone. ERM - epiretinal membrane. ORA - outer retinal atrophy. ORT - outer retinal tubulation. SRHM - subretinal hyper-reflective material             ASSESSMENT/PLAN:    ICD-10-CM   1. Epiretinal membrane (ERM) of right eye  H35.371 OCT, Retina - OU -  Both Eyes    2. Retinal tear of left eye  H33.312     3. History of repair of retinal tear by laser photocoagulation  Z98.890     4. Posterior vitreous detachment of both eyes  H43.813     5. Combined forms of age-related cataract of both eyes  H25.813     6. Dry eyes  H04.123      1. Epiretinal membrane, OD -- stable - BCVA 20/25 -- stable - OCT shows Focal ERM nasal macula and fovea with pucker and PRF, thickening centrally and nasal fovea -- stable from prior  - no indication for surgery at this time - continue to monitor - f/u 1 yr, sooner prn -- DFE/OCT  2. retinal tear, OS    - small horseshoe tear located at 0900 -- no SRF  - S/P laser retinopexy OS (03.11.19) - good laser changes  - no new tears or detachment on repeat 360 peripheral exam  - monitor  3. History of retinal tear OD  - 1100 oclock position  - s/p laser retinopexy with Dr. Ashley Royalty  - stable  - continue to monitor  4. PVD OU  - prominent floaters OU (OS > OD)  - Discussed findings and prognosis  - history of RT OU as above - No new RT or RD on 360 exam  - Reviewed s/s of RT/RD  - strict return precaution for any such RT/RD symptoms  5. Combined form age-related cataract OU   - The symptoms of cataract, surgical options, and treatments and risks were discussed with patient.  -  discussed diagnosis and progression  - under the expert management of Dr. Delaney Meigs  - clear from a retina standpoint to proceed with cataract surgery when both patient and surgeon are ready  6. Dry eyes  - under the expert management of Dr. Delaney Meigs and Dr. [place]  Ophthalmic Meds Ordered this visit:  No orders of the defined types were placed in this encounter.    Return in about 1 year (around 05/21/2024) for f/u ERM OD, DFE, OCT.  There are no Patient Instructions on file for this visit.  This document serves as a record of services personally performed by Karie Chimera, MD, PhD. It was created on their  behalf by Annalee Genta, COMT. The creation of this record is the provider's dictation and/or activities during the visit.  Electronically signed by: Annalee Genta, COMT 05/22/23 4:09 PM  This document serves as a record of services personally performed by Karie Chimera, MD, PhD. It was created on their behalf by Glee Arvin. Manson Passey, OA an ophthalmic technician. The creation of this record is the provider's dictation and/or activities during the visit.    Electronically signed by: Glee Arvin. Manson Passey, OA 05/22/23 4:09 PM  Karie Chimera, M.D., Ph.D. Diseases & Surgery of the Retina and Vitreous [facility]  I have reviewed the above documentation for accuracy and completeness, and I agree with the above. Karie Chimera, M.D., Ph.D. 05/22/23 4:09 PM  Abbreviations: M myopia (nearsighted); A astigmatism; H hyperopia (farsighted); P presbyopia; Mrx spectacle prescription;  CTL contact lenses; OD right eye; OS left eye; OU both eyes  XT exotropia; ET esotropia; PEK punctate epithelial keratitis; PEE punctate epithelial erosions; DES dry eye syndrome; MGD meibomian gland dysfunction; ATs artificial tears; PFAT's preservative free artificial tears; NSC nuclear sclerotic cataract; PSC posterior subcapsular cataract; ERM epi-retinal membrane; PVD posterior vitreous detachment; RD retinal detachment; DM diabetes mellitus; DR diabetic retinopathy; NPDR non-proliferative diabetic retinopathy; PDR proliferative diabetic retinopathy; CSME clinically significant macular edema; DME diabetic macular edema; dbh dot blot hemorrhages; CWS cotton wool spot; POAG primary open angle glaucoma; C/D cup-to-disc ratio; HVF humphrey visual field; GVF goldmann visual field; OCT optical coherence tomography; IOP intraocular pressure; BRVO Branch retinal vein occlusion; CRVO central retinal vein occlusion; CRAO central retinal artery occlusion; BRAO branch retinal artery occlusion; RT retinal tear; SB scleral  buckle; PPV pars plana vitrectomy; VH Vitreous hemorrhage; PRP panretinal laser photocoagulation; IVK intravitreal kenalog; VMT vitreomacular traction; MH Macular hole;  NVD neovascularization of the disc; NVE neovascularization elsewhere; AREDS age related eye disease study; ARMD age related macular degeneration; POAG primary open angle glaucoma; EBMD epithelial/anterior basement membrane dystrophy; ACIOL anterior chamber intraocular lens; IOL intraocular lens; PCIOL posterior chamber intraocular lens; Phaco/IOL phacoemulsification with intraocular lens placement; PRK photorefractive keratectomy; LASIK laser assisted in situ keratomileusis; HTN hypertension; DM diabetes mellitus; COPD chronic obstructive pulmonary disease

## 2023-05-22 ENCOUNTER — Encounter (INDEPENDENT_AMBULATORY_CARE_PROVIDER_SITE_OTHER): Payer: Self-pay | Admitting: Ophthalmology

## 2023-05-22 ENCOUNTER — Ambulatory Visit (INDEPENDENT_AMBULATORY_CARE_PROVIDER_SITE_OTHER): Payer: BC Managed Care – PPO | Admitting: Ophthalmology

## 2023-05-22 DIAGNOSIS — H43813 Vitreous degeneration, bilateral: Secondary | ICD-10-CM | POA: Diagnosis not present

## 2023-05-22 DIAGNOSIS — H33312 Horseshoe tear of retina without detachment, left eye: Secondary | ICD-10-CM | POA: Diagnosis not present

## 2023-05-22 DIAGNOSIS — H25813 Combined forms of age-related cataract, bilateral: Secondary | ICD-10-CM

## 2023-05-22 DIAGNOSIS — Z9889 Other specified postprocedural states: Secondary | ICD-10-CM

## 2023-05-22 DIAGNOSIS — H35371 Puckering of macula, right eye: Secondary | ICD-10-CM | POA: Diagnosis not present

## 2023-05-22 DIAGNOSIS — H04123 Dry eye syndrome of bilateral lacrimal glands: Secondary | ICD-10-CM

## 2023-06-06 ENCOUNTER — Other Ambulatory Visit: Payer: Self-pay | Admitting: Nurse Practitioner

## 2023-06-06 DIAGNOSIS — M810 Age-related osteoporosis without current pathological fracture: Secondary | ICD-10-CM

## 2023-07-19 ENCOUNTER — Encounter (HOSPITAL_COMMUNITY): Payer: Self-pay

## 2023-07-19 ENCOUNTER — Emergency Department (HOSPITAL_COMMUNITY)
Admission: EM | Admit: 2023-07-19 | Discharge: 2023-07-19 | Disposition: A | Discharge status: Home / Self Care | Payer: BC Managed Care – PPO | Attending: Emergency Medicine | Admitting: Emergency Medicine

## 2023-07-19 ENCOUNTER — Other Ambulatory Visit: Payer: Self-pay

## 2023-07-19 ENCOUNTER — Emergency Department (HOSPITAL_COMMUNITY): Payer: BC Managed Care – PPO

## 2023-07-19 DIAGNOSIS — R509 Fever, unspecified: Secondary | ICD-10-CM | POA: Diagnosis not present

## 2023-07-19 DIAGNOSIS — R109 Unspecified abdominal pain: Secondary | ICD-10-CM | POA: Insufficient documentation

## 2023-07-19 DIAGNOSIS — Z79899 Other long term (current) drug therapy: Secondary | ICD-10-CM | POA: Diagnosis not present

## 2023-07-19 DIAGNOSIS — R0602 Shortness of breath: Secondary | ICD-10-CM | POA: Diagnosis not present

## 2023-07-19 DIAGNOSIS — R079 Chest pain, unspecified: Secondary | ICD-10-CM | POA: Diagnosis not present

## 2023-07-19 DIAGNOSIS — M545 Low back pain, unspecified: Secondary | ICD-10-CM | POA: Insufficient documentation

## 2023-07-19 DIAGNOSIS — M546 Pain in thoracic spine: Secondary | ICD-10-CM | POA: Insufficient documentation

## 2023-07-19 DIAGNOSIS — R1013 Epigastric pain: Secondary | ICD-10-CM | POA: Diagnosis not present

## 2023-07-19 DIAGNOSIS — M549 Dorsalgia, unspecified: Secondary | ICD-10-CM

## 2023-07-19 LAB — BASIC METABOLIC PANEL
Anion gap: 11 (ref 5–15)
BUN: 10 mg/dL (ref 8–23)
CO2: 22 mmol/L (ref 22–32)
Calcium: 10.1 mg/dL (ref 8.9–10.3)
Chloride: 105 mmol/L (ref 98–111)
Creatinine, Ser: 0.69 mg/dL (ref 0.44–1.00)
GFR, Estimated: >60 mL/min (ref >60)
Glucose, Bld: 104 mg/dL — ABNORMAL HIGH (ref 70–99)
Potassium: 4.2 mmol/L (ref 3.5–5.1)
Sodium: 138 mmol/L (ref 135–145)

## 2023-07-19 LAB — CBC
HCT: 43.4 % (ref 36.0–46.0)
Hemoglobin: 14.8 g/dL (ref 12.0–15.0)
MCH: 31.1 pg (ref 26.0–34.0)
MCHC: 34.1 g/dL (ref 30.0–36.0)
MCV: 91.2 fL (ref 80.0–100.0)
Platelets: 264 10*3/uL (ref 150–400)
RBC: 4.76 MIL/uL (ref 3.87–5.11)
RDW: 12.5 % (ref 11.5–15.5)
WBC: 7.3 10*3/uL (ref 4.0–10.5)
nRBC: 0 % (ref 0.0–0.2)

## 2023-07-19 LAB — TROPONIN I (HIGH SENSITIVITY)
Troponin I (High Sensitivity): <2 ng/L (ref <18)
Troponin I (High Sensitivity): <2 ng/L (ref <18)

## 2023-07-19 LAB — HEPATIC FUNCTION PANEL
ALT: 22 U/L (ref 0–44)
AST: 26 U/L (ref 15–41)
Albumin: 4.1 g/dL (ref 3.5–5.0)
Alkaline Phosphatase: 57 U/L (ref 38–126)
Bilirubin, Direct: 0.1 mg/dL (ref 0.0–0.2)
Indirect Bilirubin: 0.4 mg/dL (ref 0.3–0.9)
Total Bilirubin: 0.5 mg/dL (ref <1.2)
Total Protein: 7 g/dL (ref 6.5–8.1)

## 2023-07-19 LAB — LIPASE, BLOOD: Lipase: 39 U/L (ref 11–51)

## 2023-07-19 MED ORDER — METHOCARBAMOL 500 MG PO TABS
500.0000 mg | ORAL_TABLET | Freq: Once | ORAL | Status: AC
Start: 2023-07-19 — End: 2023-07-19
  Administered 2023-07-19: 500 mg via ORAL
  Filled 2023-07-19: qty 1

## 2023-07-19 MED ORDER — METHOCARBAMOL 500 MG PO TABS: 500.0000 mg | ORAL_TABLET | Freq: Two times a day (BID) | ORAL | 0 refills | Status: AC | PRN

## 2023-07-19 NOTE — ED Provider Triage Note (Cosign Needed Addendum)
Emergency Medicine Provider Triage Evaluation Note  Amanda Salazar , a 62 y.o. female  was evaluated in triage.  Pt complains of chest pain. Report acute onset of sharp intense pain starting in her back and radiates to her chest and abdomen approximately 1 hr ago.  Report lightheadedness, no nausea but endorse sob.  Sts " I think I have a heart attack".  No hx of AAA.  No smoking hx  Review of Systems  Positive: As above Negative: As above  Physical Exam  BP 121/87   Pulse 80   Temp 98 F (36.7 C)   Resp 20   Ht 5\' 2"  (1.575 m)   Wt 54.4 kg   LMP 05/11/2012   SpO2 100%   BMI 21.95 kg/m  Gen:   Awake, appears anxious Resp:  Normal effort  MSK:   Moves extremities without difficulty  Other:  Intact distal pulses, no focal deficit  Medical Decision Making  Medically screening exam initiated at 2:12 PM.  Appropriate orders placed.  Amanda Salazar was informed that the remainder of the evaluation will be completed by another provider, this initial triage assessment does not replace that evaluation, and the importance of remaining in the ED until their evaluation is complete.  Need to r/o dissection.  Request chest/abd/pelvis CTA, code medical activated.  Initial EKG reassuring.    Fayrene Helper, PA-C 07/19/23 1421  ADDENDUM: pt has IV contrast allergy listed on her chart.  She report she does not think she has true allergy.  Sts she passed out while having a procedure and they put CM allergy afterward.  Since CXR currently without widening mediastinum and pt is not hypertensive.  I will defer further imaging to next provider.     Fayrene Helper, PA-C 07/19/23 [phone]

## 2023-07-19 NOTE — ED Triage Notes (Signed)
Pt came to ED for back pain in middle of back and chest pressure. C/O [facility] and pt states started 1 hr ago. Axox4. Pt appears anxious.

## 2023-07-19 NOTE — Discharge Instructions (Addendum)
You were seen today for back and belly pain. Your workup here did not show any heart attack, gallbladder, liver, or chest pathology. You may benefit from continued workup outpatient with your regular primary care doctor. I will prescribe you a short course of a medicine called Robaxin, which can help with muscle spasm. This medication can be sedating, so do not take it if you need to drive or need to stay awake for any task.

## 2023-07-19 NOTE — ED Provider Notes (Signed)
[place] EMERGENCY DEPARTMENT AT [facility] Provider Note   CSN: [id number] Arrival date & time: 07/19/23  1402     History  Chief Complaint  Patient presents with   Back Pain   Shortness of Breath    Amanda Salazar is a 62 y.o. female with past medical history of osteoporosis and lower back pain presenting with acute onset back pain rating to the chest and abdomen with associated shortness of breath.  She states that her pain is improved at the time of assessment.  Her pain is difficult to categorize.  As far as her back pain goes it began in the thoracic area and felt different than any lower back pain she has had before.  She says does not describe the pain as burning or tearing.  She states that the pain radiated to her abdomen, she describes this pain as a pulling sensation.  She also stated that the pain radiates to the chest, and she had some associated shortness of breath with that.  Her pain is better with positioning, and if she is able to lay back.  It is worse when sitting for exam.   Back Pain Associated symptoms: abdominal pain, chest pain and fever   Associated symptoms: no dysuria   Shortness of Breath Associated symptoms: abdominal pain, chest pain and fever   Associated symptoms: no vomiting        Home Medications Prior to Admission medications   Medication Sig Start Date End Date Taking? Authorizing Provider  methocarbamol (ROBAXIN) 500 MG tablet Take 1 tablet (500 mg total) by mouth 2 (two) times daily as needed for up to 10 days for muscle spasms. 07/19/23 07/29/23 Yes Lovie Macadamia, MD  alendronate (FOSAMAX) 70 MG tablet TAKE 1 TAB BY MOUTH EVERY 7 DAYS. TAKE WITH A FULL GLASS OF WATER ON AN EMPTY STOMACH. SIT UPRIGHT FOR 30 MINUTES 06/07/23   Raynelle Dick, NP  Cholecalciferol 125 MCG (5000 UT) capsule Take 5,000 Units by mouth daily.    [provider]  Cyanocobalamin (VITAMIN B 12 PO) Take by mouth daily.    [provider]  cycloSPORINE (RESTASIS) 0.05 % ophthalmic emulsion Place 1 drop into both eyes 2 (two) times daily.    [provider]  estradiol (ESTRACE) 0.1 MG/GM vaginal cream Place vaginally. 01/23/23   [provider]  hydroquinone 4 % cream APPLY TOPICALLY TWICE A DAY Patient taking differently: In Fall 12/22/21   Janalyn Harder, MD  ketoconazole (NIZORAL) 2 % shampoo Apply 1 Application topically 2 (two) times a week. 09/08/22   [provider]  minoxidil (ROGAINE) 2 % external solution Apply topically 2 (two) times daily.    [provider]  nitrofurantoin, macrocrystal-monohydrate, (MACROBID) 100 MG capsule As Needed 02/15/23   [provider]  OVER THE COUNTER MEDICATION Turmeric daily    [provider]  OVER THE COUNTER MEDICATION Raw Flora    [provider]  rosuvastatin (CRESTOR) 5 MG tablet TAKE 1 TABLET BY MOUTH EVERY DAY 02/08/23   Raynelle Dick, NP  tretinoin (RETIN-A) 0.05 % cream APPLY TO AFFECTED AREA EVERY DAY AT BEDTIME Patient taking differently: In Fall 12/22/21   Janalyn Harder, MD  triamcinolone (NASACORT) 55 MCG/ACT AERO nasal inhaler SPRAY 2 SPRAYS NASALLY DAILY 04/23/20   Lucky Cowboy, MD      Allergies    Erythromycin and Iodinated contrast media    Review of Systems   Review of Systems  Constitutional:  Positive for chills, fatigue and fever.  Respiratory:  Positive for shortness of breath.   Cardiovascular:  Positive for chest pain. Negative for leg swelling.  Gastrointestinal:  Positive for abdominal pain and nausea. Negative for constipation, diarrhea and vomiting.  Genitourinary:  Negative for decreased urine volume, dysuria, frequency and urgency.  Musculoskeletal:  Positive for back pain.    Physical Exam Updated Vital Signs BP 111/68   Pulse 73   Temp 97.7 F (36.5 C) (Oral)   Resp 18   Ht 5\' 2"  (1.575 m)   Wt 54.4 kg   LMP 05/11/2012   SpO2 100%   BMI 21.95 kg/m  Physical  Exam Constitutional:      Appearance: She is well-developed.  Cardiovascular:     Rate and Rhythm: Normal rate and regular rhythm.     Heart sounds: Normal heart sounds.  Pulmonary:     Effort: Pulmonary effort is normal.     Breath sounds: Normal breath sounds.  Abdominal:     Palpations: Abdomen is soft.     Tenderness: There is abdominal tenderness.  Skin:    General: Skin is warm and dry.  Neurological:     General: No focal deficit present.     Mental Status: She is alert.     ED Results / Procedures / Treatments   Labs (all labs ordered are listed, but only abnormal results are displayed) Labs Reviewed  BASIC METABOLIC PANEL - Abnormal; Notable for the following components:      Result Value   Glucose, Bld 104 (*)    All other components within normal limits  CBC  HEPATIC FUNCTION PANEL  LIPASE, BLOOD  TROPONIN I (HIGH SENSITIVITY)  TROPONIN I (HIGH SENSITIVITY)    EKG EKG Interpretation Date/Time:  Wednesday July 19 2023 14:13:15 EST Ventricular Rate:  81 PR Interval:  128 QRS Duration:  68 QT Interval:  362 QTC Calculation: 420 R Axis:   86  Text Interpretation: Normal sinus rhythm Normal ECG When compared with ECG of 02-Dec-2016 11:12, PREVIOUS ECG IS PRESENT when compared to prior, similar appearnce. No STEMI Confirmed by Theda Belfast ([id number]) on 07/19/2023 8:35:22 PM  Radiology US Abdomen Limited RUQ (LIVER/GB)  Result Date: 07/19/2023 CLINICAL DATA:  Epigastric pain EXAM: ULTRASOUND ABDOMEN LIMITED RIGHT UPPER QUADRANT COMPARISON:  None Available. FINDINGS: Gallbladder: No gallstones or wall thickening visualized. No sonographic Murphy sign noted by sonographer. Common bile duct: Diameter: 4 mm Liver: No focal lesion identified. Within normal limits in parenchymal echogenicity. Portal vein is patent on color Doppler imaging with normal direction of blood flow towards the liver. Other: None. IMPRESSION: 1. Unremarkable right upper quadrant  ultrasound. Electronically Signed   By: Sharlet Salina M.D.   On: 07/19/2023 22:10   DG Chest 2 View  Result Date: 07/19/2023 CLINICAL DATA:  Chest pain. EXAM: CHEST - 2 VIEW COMPARISON:  January 30, 2023. FINDINGS: The heart size and mediastinal contours are within normal limits. Both lungs are clear. The visualized skeletal structures are unremarkable. IMPRESSION: No active cardiopulmonary disease. Electronically Signed   By: Lupita Raider M.D.   On: 07/19/2023 15:42    Procedures Procedures    Medications Ordered in ED Medications  methocarbamol (ROBAXIN) tablet 500 mg (500 mg Oral Given 07/19/23 2245)    ED Course/ Medical Decision Making/ A&P  Medical Decision Making This is a 62 year old female presenting with back pain that radiates to the abdomen and chest.  It is improved since she presented here.  Seems that some of her pain is multifactorial as she states that her abdominal pain felt like indigestion and hunger pain, got better when she ate some food in the waiting room.  Differential diagnosis would include osteoporotic fracture, dissection, pancreatitis, gallstone, nephrolithiasis, SBO, ileus, colitis, MI.  Her workup here has been relatively unremarkable.  Chest x-ray showed no abnormalities.  Serum chemistry is normal.  EKG normal.  Troponin is negative.  Little concern for cardiac pathology at this time given normal EKG and negative troponins.  She did have a allergy listed to contrast-which when interviewed the patient seems like a vasovagal episode rather than a true contrast reaction.  This limits our ability to obtain a CTA study.  Her back pain may be MSK in nature, as it is positional and achy.  She does not have any point tenderness of the spine to make me think she has a compression fracture.  Straight leg test negative.  She may have some thoracolumbar strain at play and did have a bit of increased tone of the paraspinal muscles from the  on exam, though no tenderness to palpation.  She may also have biliary or pancreatic pathology given her abdominal pain and back pain.  We will go ahead and get a CMP, lipase, and right upper quadrant ultrasound to further evaluate.  If this is negative we will likely discharge with short course of Robaxin and PCP follow-up.  Hepatic function panel within normal limits, lipase negative. RUQ [place] negative. Will go ahead and d/c with robaxin, PCP follow up. Discussed OTC analgesia.    Amount and/or Complexity of Data Reviewed Labs: ordered.           Final Clinical Impression(s) / ED Diagnoses Final diagnoses:  Back pain, unspecified back location, unspecified back pain laterality, unspecified chronicity  Abdominal pain, unspecified abdominal location    Rx / DC Orders ED Discharge Orders          Ordered    methocarbamol (ROBAXIN) 500 MG tablet  2 times daily PRN        07/19/23 2222              Lovie Macadamia, MD 07/19/23 2307    Tegeler, Canary Brim, MD 07/19/23 [phone]

## 2023-08-30 IMAGING — MG MM DIGITAL SCREENING BILAT W/ TOMO AND CAD
8 series · 8 of 24 positions shown · non-contrast
Comparison: Previous exam(s).

CLINICAL DATA: Screening.

EXAM:
DIGITAL SCREENING BILATERAL MAMMOGRAM WITH TOMOSYNTHESIS AND CAD
TECHNIQUE: Bilateral screening digital craniocaudal and mediolateral oblique
mammograms were obtained. Bilateral screening digital breast
tomosynthesis was performed. The images were evaluated with
computer-aided detection.

[L MLO synth-2D]
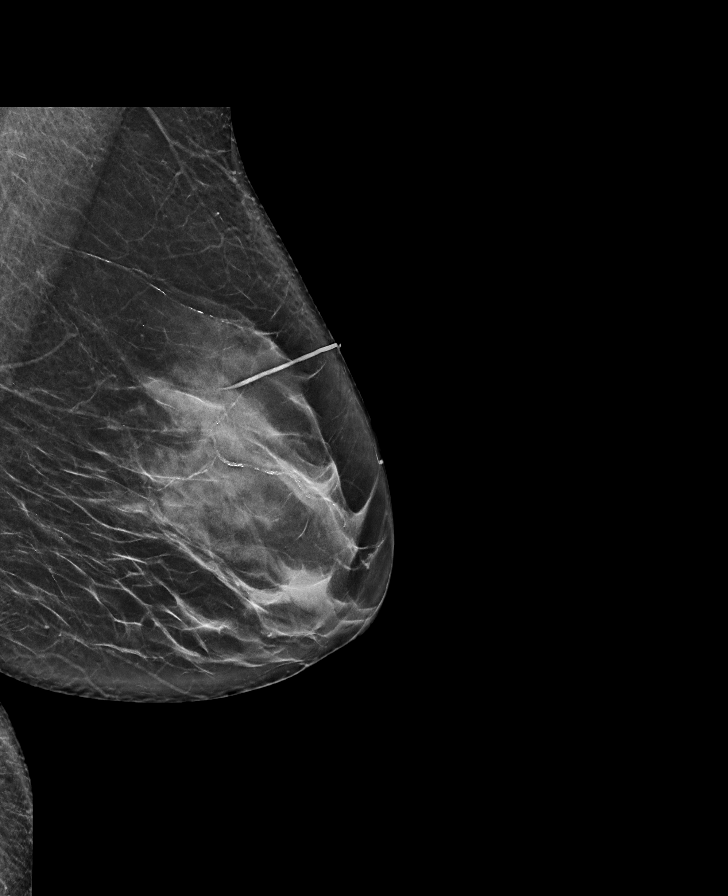

[R CC synth-2D]
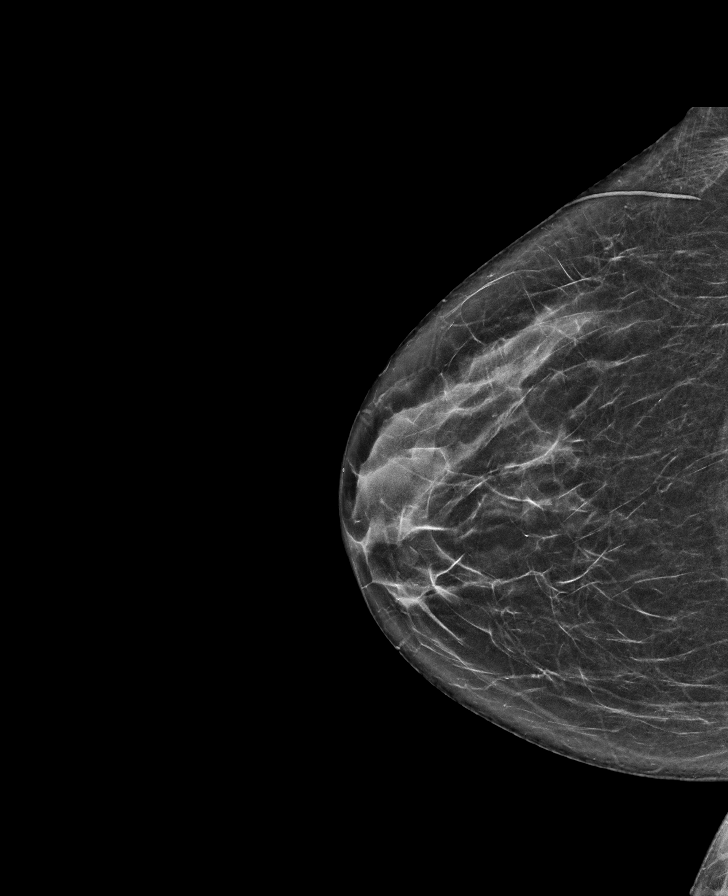

[R MLO synth-2D]
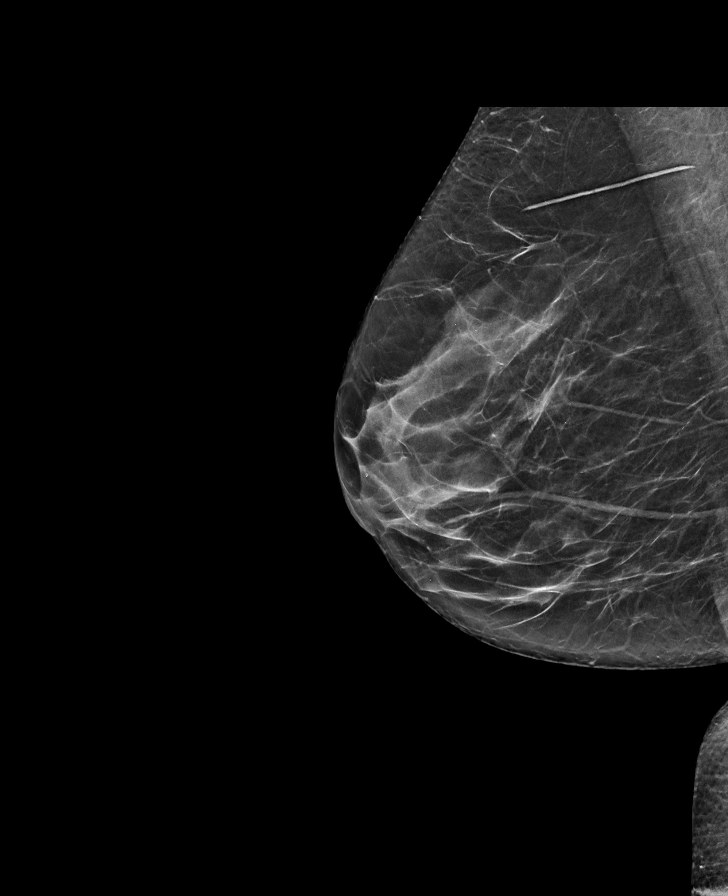

[L CC synth-2D]
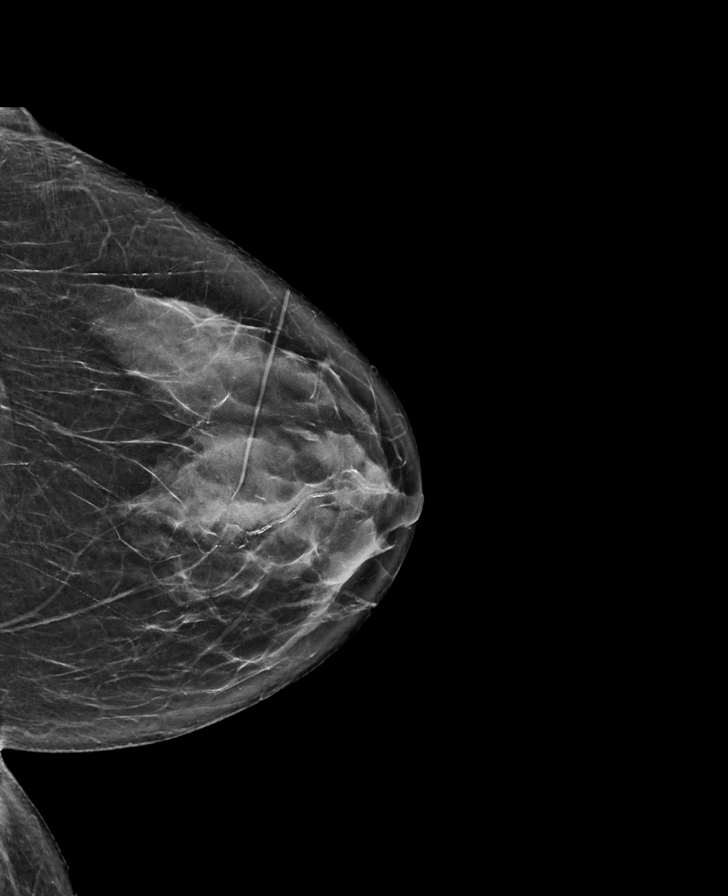

[R CC tomo · tomo slice 31/61.0]
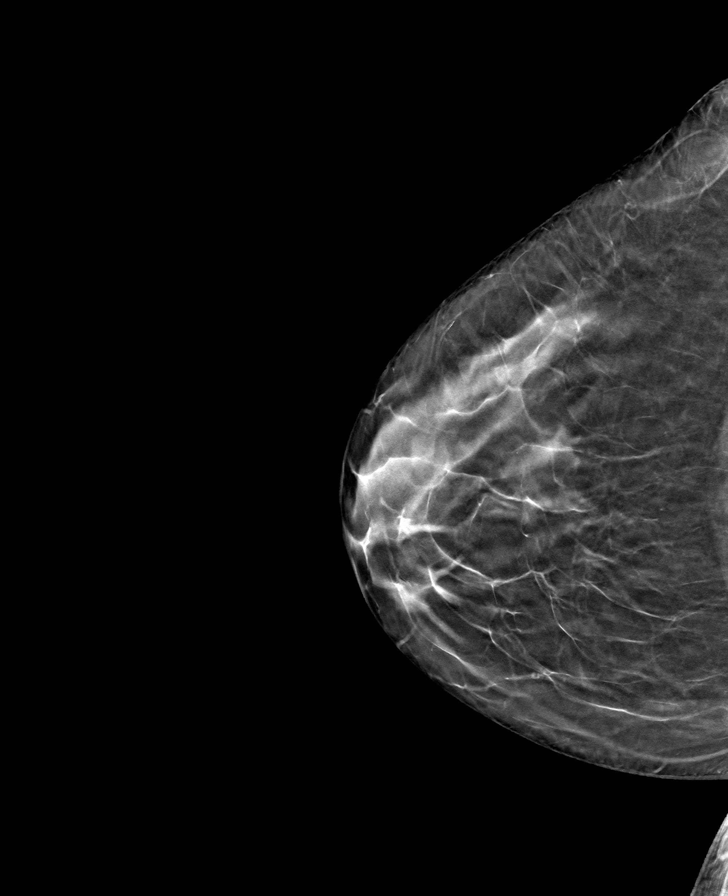

[R MLO tomo · tomo slice 31/61.0]
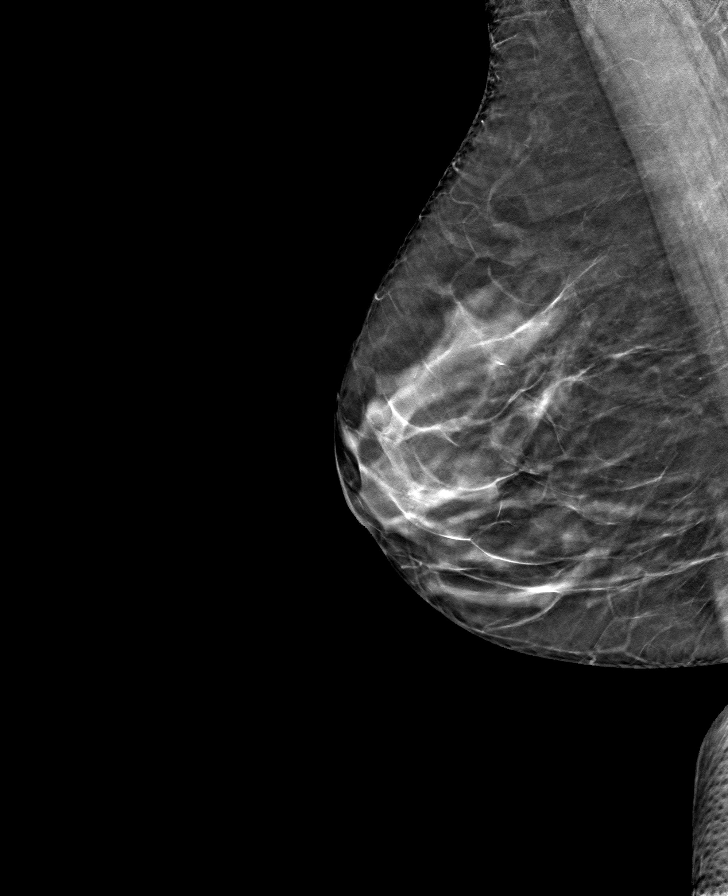

[L MLO tomo · tomo slice 35/68.0]
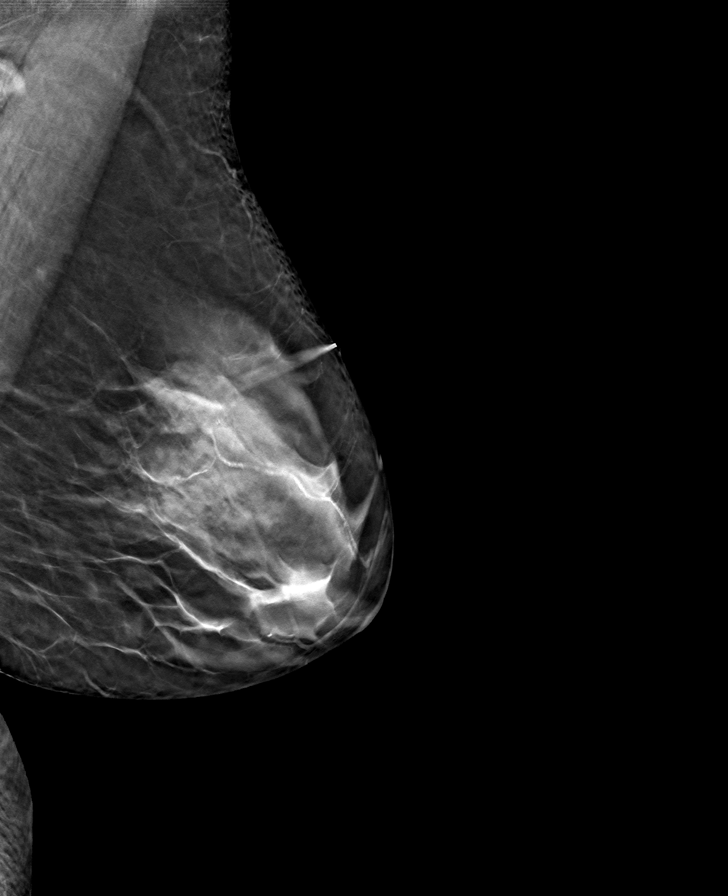

[L CC tomo · tomo slice 32/63.0]
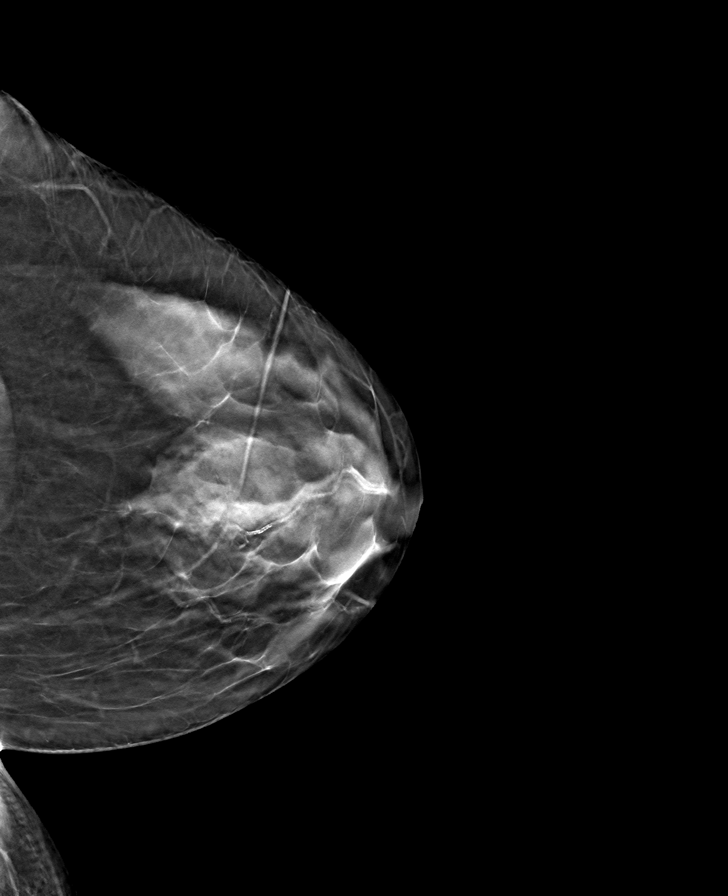

[8 of 24 positions shown; findings below may reference images not displayed]

ACR Breast Density Category c: The breast tissue is heterogeneously
dense, which may obscure small masses.
FINDINGS: There are no findings suspicious for malignancy.
IMPRESSION: No mammographic evidence of malignancy. A result letter of this
screening mammogram will be mailed directly to the patient.

RECOMMENDATION:
Screening mammogram in one year. (Code:Q3-W-BC3)

BI-RADS CATEGORY  1: Negative.

## 2023-09-05 ENCOUNTER — Encounter: Payer: Self-pay | Admitting: Nurse Practitioner

## 2023-09-06 MED ORDER — NABUMETONE 500 MG PO TABS: 500.0000 mg | ORAL_TABLET | Freq: Two times a day (BID) | ORAL | 1 refills | Status: DC | PRN

## 2023-10-12 ENCOUNTER — Encounter (INDEPENDENT_AMBULATORY_CARE_PROVIDER_SITE_OTHER): Admitting: Ophthalmology

## 2023-10-12 DIAGNOSIS — H33303 Unspecified retinal break, bilateral: Secondary | ICD-10-CM | POA: Diagnosis not present

## 2023-10-12 DIAGNOSIS — H43813 Vitreous degeneration, bilateral: Secondary | ICD-10-CM | POA: Diagnosis not present

## 2023-10-12 DIAGNOSIS — H35371 Puckering of macula, right eye: Secondary | ICD-10-CM | POA: Diagnosis not present

## 2023-11-08 ENCOUNTER — Ambulatory Visit (INDEPENDENT_AMBULATORY_CARE_PROVIDER_SITE_OTHER): Payer: BC Managed Care – PPO | Admitting: Family Medicine

## 2023-11-08 ENCOUNTER — Encounter: Payer: Self-pay | Admitting: Family Medicine

## 2023-11-08 VITALS — BP 115/78 | HR 74 | Temp 97.6°F | Resp 16 | Ht 62.0 in | Wt 119.0 lb

## 2023-11-08 DIAGNOSIS — E7800 Pure hypercholesterolemia, unspecified: Secondary | ICD-10-CM | POA: Diagnosis not present

## 2023-11-08 DIAGNOSIS — F411 Generalized anxiety disorder: Secondary | ICD-10-CM

## 2023-11-08 DIAGNOSIS — M810 Age-related osteoporosis without current pathological fracture: Secondary | ICD-10-CM | POA: Diagnosis not present

## 2023-11-08 DIAGNOSIS — F5101 Primary insomnia: Secondary | ICD-10-CM | POA: Diagnosis not present

## 2023-11-08 DIAGNOSIS — L989 Disorder of the skin and subcutaneous tissue, unspecified: Secondary | ICD-10-CM

## 2023-11-08 MED ORDER — ROSUVASTATIN CALCIUM 5 MG PO TABS: 5.0000 mg | ORAL_TABLET | Freq: Every day | ORAL | 1 refills | Status: DC

## 2023-11-08 MED ORDER — ALENDRONATE SODIUM 70 MG PO TABS: 70.0000 mg | ORAL_TABLET | ORAL | 3 refills | Status: AC

## 2023-11-08 MED ORDER — MIRTAZAPINE 15 MG PO TABS: 15.0000 mg | ORAL_TABLET | Freq: Every day | ORAL | 0 refills | Status: DC

## 2023-11-08 NOTE — Patient Instructions (Addendum)
 Welcome to [facility]! It was a pleasure meeting you today.  As discussed, Please schedule a 2-3 wk follow up visit today.  Take the mirtazipine about 11pm, meditation, bed at midnight  Set time to exercise and be consistent Derm, Card, labs   PLEASE NOTE:  If you had any LAB tests please let us know if you have not heard back within a few days. You may see your results on MyChart before we have a chance to review them but we will give you a call once they are reviewed by [place]. If we ordered any REFERRALS today, please let us know if you have not heard from their office within the next week.  Let us know through MyChart if you are needing REFILLS, or have your pharmacy send [place] the request. You can also use MyChart to communicate with me or any office staff.  Please try these tips to maintain a healthy lifestyle:  Eat most of your calories during the day when you are active. Eliminate processed foods including packaged sweets (pies, cakes, cookies), reduce intake of potatoes, white bread, white pasta, and white rice. Look for whole grain options, oat flour or almond flour.  Each meal should contain half fruits/vegetables, one quarter protein, and one quarter carbs (no bigger than a computer mouse).  Cut down on sweet beverages. This includes juice, soda, and sweet tea. Also watch fruit intake, though this is a healthier sweet option, it still contains natural sugar! Limit to 3 servings daily.  Drink at least 1 glass of water with each meal and aim for at least 8 glasses per day  Exercise at least 150 minutes every week.

## 2023-11-08 NOTE — Progress Notes (Signed)
 New Patient Office Visit  Subjective:  Patient ID: Amanda Salazar, female    DOB: February 24, 1961  Age: 63 y.o. MRN: [id number]  CC:  Chief Complaint  Patient presents with   Establish Care    Initial visit to establish care with new pcp Spots on face and shoulder, would like them looked at     HPI Amanda Salazar presents for new pt [facility] Discussed the use of AI scribe software for clinical note transcription with the patient, who gave verbal consent to proceed.  History of Present Illness Amanda Salazar is a 63 year old female who presents for a routine follow-up and management of chronic conditions.xfer from Dr. Oneta Rack  She has a history of hyperlipidemia, initially managed without medication. A few years ago, she started taking a generic form of Crestor, which significantly reduced her cholesterol levels. Her last blood test in August showed cholesterol levels below 100, but she noticed an increase during the summer, possibly due to dietary changes. She has not had a follow-up test since August and plans to schedule one soon. A past calcium score test revealed some deposits, leading to ongoing monitoring by a cardiologist. Family history includes high cholesterol and cardiovascular issues(a fib).  She has osteoporosis with a T-score of -3.5 and has been on a weekly medication for about a year. Her next bone density test is due in September 2025. She engages in weight-bearing exercises to improve bone density.  Her ophthalmologic history includes radial keratotomy surgery years ago, affecting her vision. She experiences dry eyes, retinal issues, and cataracts, and sees multiple specialists for these conditions. Despite corrective glasses, her vision issues are a significant source of stress and frustration. Will be undergoing cataract surgery.  She has a history of urinary tract infections that were frequent in her youth but became rare after childbirth. Last year, she  experienced a few UTIs, initially difficult to diagnose due to negative tests. A urologist ruled out cancer and other issues. She manages potential UTIs with nitrofurantoin, which she finds effective.  She has concerns about her memory and mental capacity, attributing some issues to stress and lack of sleep. She describes herself as a 'bad worrier' and notes difficulty sleeping, which she believes exacerbates her memory problems. She has not taken medication for anxiety or sleep but is considering options to improve her sleep quality.  She has a history of skin checks due to her skin type and past removal of a potentially cancerous lesion. She plans to schedule a follow-up with her dermatologist for a skin check, as she has noticed some concerning spots on her skin.  She describes a history of chest pain experienced last summer. The pain was a dull ache in the left chest, and despite a chest X-ray showing scoliosis and possible emphysema, no definitive cause was identified. The pain resolved on its own after a few days at the beach.  Just retired end of January and struggling to find motivation and a schedule     Current Outpatient Medications:    Cholecalciferol 125 MCG (5000 UT) capsule, Take 5,000 Units by mouth daily., Disp: , Rfl:    Cyanocobalamin (VITAMIN B 12 PO), Take by mouth daily., Disp: , Rfl:    cycloSPORINE (RESTASIS) 0.05 % ophthalmic emulsion, Place 1 drop into both eyes 2 (two) times daily., Disp: , Rfl:    estradiol (ESTRACE) 0.1 MG/GM vaginal cream, Place vaginally., Disp: , Rfl:    hydroquinone 4 % cream, APPLY TOPICALLY  TWICE A DAY (Patient taking differently: In Fall), Disp: 28.35 g, Rfl: 3   ketoconazole (NIZORAL) 2 % shampoo, Apply 1 Application topically 2 (two) times a week., Disp: , Rfl:    minoxidil (ROGAINE) 2 % external solution, Apply topically 2 (two) times daily., Disp: , Rfl:    mirtazapine (REMERON) 15 MG tablet, Take 1 tablet (15 mg total) by mouth at  bedtime., Disp: 30 tablet, Rfl: 0   nabumetone (RELAFEN) 500 MG tablet, Take 1 tablet (500 mg total) by mouth 2 (two) times daily as needed., Disp: 30 tablet, Rfl: 1   nitrofurantoin, macrocrystal-monohydrate, (MACROBID) 100 MG capsule, As Needed, Disp: , Rfl:    OVER THE COUNTER MEDICATION, 500 mg. Turmeric and pepper daily, Disp: , Rfl:    OVER THE COUNTER MEDICATION, Raw Flora, Disp: , Rfl:    tretinoin (RETIN-A) 0.05 % cream, APPLY TO AFFECTED AREA EVERY DAY AT BEDTIME (Patient taking differently: In Fall), Disp: 45 g, Rfl: 5   triamcinolone (NASACORT) 55 MCG/ACT AERO nasal inhaler, SPRAY 2 SPRAYS NASALLY DAILY, Disp: 50.7 each, Rfl: 3   alendronate (FOSAMAX) 70 MG tablet, Take 1 tablet (70 mg total) by mouth once a week. Take with a full glass of water on an empty stomach., Disp: 12 tablet, Rfl: 3   rosuvastatin (CRESTOR) 5 MG tablet, Take 1 tablet (5 mg total) by mouth daily., Disp: 90 tablet, Rfl: 1  Past Medical History:  Diagnosis Date   ADD (attention deficit disorder)    Allergy    Anxiety    Arthritis    Atypical nevus 01/11/1995   Medial Lower Abdomen - Slight   Basal cell carcinoma 09/07/2020   nod- left temporal scalp   BCC (basal cell carcinoma of skin) 04/30/2008   Right Neck   Blurred vision    Cataract    Mixed form OU   Heart murmur    Hemangioma    on liver   Hyperlipidemia    Osteoporosis    Seasonal allergies     Past Surgical History:  Procedure Laterality Date   BREAST EXCISIONAL BIOPSY Bilateral    BREAST SURGERY     biopsy x 3   CESAREAN SECTION     x 4   EYE SURGERY Bilateral    RK, torn retina    Family History  Problem Relation Age of Onset   Hearing loss Mother    Arthritis Mother    Hyperlipidemia Mother    Hypertension Mother    Atrial fibrillation Mother    Arthritis Father    Hyperlipidemia Father    Stroke Father    Depression Sister    Heart disease Brother    Hypertension Brother    Atrial fibrillation Brother      Social History   Socioeconomic History   Marital status: Married    Spouse name: Not on file   Number of children: 4   Years of education: Not on file   Highest education level: Not on file  Occupational History   Not on file  Tobacco Use   Smoking status: Never   Smokeless tobacco: Never  Vaping Use   Vaping status: Never Used  Substance and Sexual Activity   Alcohol use: Yes    Alcohol/week: 8.0 standard drinks of alcohol    Types: 8 Glasses of wine per week    Comment: glass wine most nights   Drug use: Not Currently    Types: Marijuana    Comment: used in the past  Sexual activity: Not Currently  Other Topics Concern   Not on file  Social History Narrative   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   Social Drivers of Health   Financial Resource Strain: Not on file  Food Insecurity: Not on file  Transportation Needs: Not on file  Physical Activity: Not on file  Stress: Not on file  Social Connections: Unknown (12/17/2021)   Received from [facility]   Social Network    Social Network: Not on file  Intimate Partner Violence: Unknown (11/10/2021)   Received from [facility], [facility]   HITS    Physically Hurt: Not on file    Insult or Talk Down To: Not on file    Threaten Physical Harm: Not on file    Scream or Curse: Not on file    ROS  ROS:HPI  Objective:   Today's Vitals: BP 115/78   Pulse 74   Temp 97.6 F (36.4 C) (Temporal)   Resp 16   Ht 5\' 2"  (1.575 m)   Wt 119 lb (54 kg)   LMP 05/11/2012   SpO2 98%   BMI 21.77 kg/m   Physical Exam  Gen: WDWN NAD HEENT: NCAT, conjunctiva not injected, sclera nonicteric NECK:  supple, no thyromegaly, no nodes, no carotid bruits CARDIAC: RRR, S1S2+, no murmur. DP 2+B LUNGS: CTAB. No wheezes ABDOMEN:  BS+, soft, NTND, No HSM, no masses EXT:  no edema MSK: no gross abnormalities.  NEURO: A&O x3.  CN II-XII intact.  PSYCH: normal mood. Good eye contact   Skin-rough patch L temple  area.  Irreg spot R nasal bridge w/some puckering  Assessment & Plan:  Primary insomnia  Age-related osteoporosis without current pathological fracture -     Alendronate Sodium; Take 1 tablet (70 mg total) by mouth once a week. Take with a full glass of water on an empty stomach.  Dispense: 12 tablet; Refill: 3  Pure hypercholesterolemia -     Rosuvastatin Calcium; Take 1 tablet (5 mg total) by mouth daily.  Dispense: 90 tablet; Refill: 1  Generalized anxiety disorder  Skin lesions  Other orders -     Mirtazapine; Take 1 tablet (15 mg total) by mouth at bedtime.  Dispense: 30 tablet; Refill: 0  Assessment and Plan Assessment & Plan Sleep Disturbance   Chronic sleep disturbance is likely exacerbated by stress, anxiety, and lack of routine. She identifies as a night owl and struggles with establishing a sleep schedule. Prescribe mirtazapine to be taken at 11 PM to aid sleep. Encourage a consistent bedtime routine with meditation before bed.  Hyperlipidemia   Hyperlipidemia is managed with generic Crestor. Cholesterol levels, previously reduced, increased in August, possibly due to dietary changes. There is a family history of heart disease. The cardiologist recommends maintaining cholesterol levels below 70 due to previous calcium score findings. Encourage follow-up with the cardiologist for cholesterol re-evaluation and advise scheduling a cholesterol test as recommended.  Osteoporosis   Osteoporosis with a T-score of -3.5 is currently managed with weekly medication. The last bone density test was in September 2023. Schedule the next bone density test for September 2025. Encourage weight-bearing exercises such as walking and light weights.  Chronic Eye Conditions   She has multiple eye conditions, including radial keratotomy, dry eye, and cataracts, which are a significant source of stress.  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 A previous urological evaluation showed no significant findings.  Skin Lesions   Skin lesions on the left temple and right shoulder, with a history of actinic keratosis and seborrheic keratosis, require evaluation. Schedule an appointment with a dermatologist for evaluation of skin lesions.  General Health Maintenance   She is retired and adjusting to a new lifestyle, reporting a lack of exercise and weight gain. There is concern about mental capacity and stress management. Encourage establishing a structured daily routine, including regular exercise. Advise on stress management techniques and the potential benefits of exercise on mental health.    Follow-up: Return in about 2 weeks (around 11/22/2023) for chronic follow-up.   Angelena Sole, MD

## 2023-11-10 DIAGNOSIS — E7800 Pure hypercholesterolemia, unspecified: Secondary | ICD-10-CM | POA: Diagnosis not present

## 2023-11-10 LAB — HEPATIC FUNCTION PANEL
ALT: 18 IU/L (ref 0–32)
AST: 26 IU/L (ref 0–40)
Albumin: 4.5 g/dL (ref 3.9–4.9)
Alkaline Phosphatase: 78 IU/L (ref 44–121)
Bilirubin Total: 0.4 mg/dL (ref 0.0–1.2)
Bilirubin, Direct: 0.14 mg/dL (ref 0.00–0.40)
Total Protein: 6.6 g/dL (ref 6.0–8.5)

## 2023-11-10 LAB — LIPID PANEL
Chol/HDL Ratio: 2.6 ratio (ref 0.0–4.4)
Cholesterol, Total: 179 mg/dL (ref 100–199)
HDL: 70 mg/dL (ref >39)
LDL Chol Calc (NIH): 97 mg/dL (ref 0–99)
Triglycerides: 62 mg/dL (ref 0–149)
VLDL Cholesterol Cal: 12 mg/dL (ref 5–40)

## 2023-11-21 DIAGNOSIS — D2372 Other benign neoplasm of skin of left lower limb, including hip: Secondary | ICD-10-CM | POA: Diagnosis not present

## 2023-11-21 DIAGNOSIS — D485 Neoplasm of uncertain behavior of skin: Secondary | ICD-10-CM | POA: Diagnosis not present

## 2023-11-21 DIAGNOSIS — L814 Other melanin hyperpigmentation: Secondary | ICD-10-CM | POA: Diagnosis not present

## 2023-11-21 DIAGNOSIS — L821 Other seborrheic keratosis: Secondary | ICD-10-CM | POA: Diagnosis not present

## 2023-11-29 ENCOUNTER — Ambulatory Visit: Admitting: Family Medicine

## 2023-12-01 ENCOUNTER — Other Ambulatory Visit: Payer: Self-pay | Admitting: Family Medicine

## 2023-12-01 NOTE — Telephone Encounter (Signed)
 Needs appt

## 2023-12-04 ENCOUNTER — Encounter: Payer: Self-pay | Admitting: Internal Medicine

## 2023-12-25 ENCOUNTER — Ambulatory Visit: Payer: Self-pay | Admitting: Internal Medicine

## 2023-12-25 DIAGNOSIS — E7800 Pure hypercholesterolemia, unspecified: Secondary | ICD-10-CM

## 2024-01-04 ENCOUNTER — Other Ambulatory Visit: Payer: Self-pay | Admitting: Family Medicine

## 2024-01-04 DIAGNOSIS — Z1231 Encounter for screening mammogram for malignant neoplasm of breast: Secondary | ICD-10-CM

## 2024-01-05 ENCOUNTER — Telehealth: Payer: Self-pay | Admitting: *Deleted

## 2024-01-05 DIAGNOSIS — M810 Age-related osteoporosis without current pathological fracture: Secondary | ICD-10-CM

## 2024-01-05 NOTE — Telephone Encounter (Signed)
 Copied from CRM [id number]. Topic: Clinical - Request for Lab/Test Order >> Jan 05, 2024 12:49 PM Deaijah H wrote: Reason for CRM: Patient called in needing an order or referral for Bone density test to be completed at [facility] in September  Referral placed.

## 2024-01-09 DIAGNOSIS — Z789 Other specified health status: Secondary | ICD-10-CM | POA: Diagnosis not present

## 2024-01-09 DIAGNOSIS — D485 Neoplasm of uncertain behavior of skin: Secondary | ICD-10-CM | POA: Diagnosis not present

## 2024-01-09 DIAGNOSIS — L538 Other specified erythematous conditions: Secondary | ICD-10-CM | POA: Diagnosis not present

## 2024-01-09 DIAGNOSIS — L821 Other seborrheic keratosis: Secondary | ICD-10-CM | POA: Diagnosis not present

## 2024-01-09 DIAGNOSIS — R208 Other disturbances of skin sensation: Secondary | ICD-10-CM | POA: Diagnosis not present

## 2024-01-09 DIAGNOSIS — L820 Inflamed seborrheic keratosis: Secondary | ICD-10-CM | POA: Diagnosis not present

## 2024-01-23 ENCOUNTER — Ambulatory Visit
Admission: RE | Admit: 2024-01-23 | Discharge: 2024-01-23 | Disposition: A | Discharge status: Home / Self Care | Source: Ambulatory Visit | Attending: Family Medicine | Admitting: Family Medicine

## 2024-01-23 DIAGNOSIS — E7800 Pure hypercholesterolemia, unspecified: Secondary | ICD-10-CM | POA: Diagnosis not present

## 2024-01-23 DIAGNOSIS — Z1231 Encounter for screening mammogram for malignant neoplasm of breast: Secondary | ICD-10-CM

## 2024-01-24 LAB — LIPID PANEL
Chol/HDL Ratio: 3.1 ratio (ref 0.0–4.4)
Cholesterol, Total: 175 mg/dL (ref 100–199)
HDL: 57 mg/dL (ref >39)
LDL Chol Calc (NIH): 96 mg/dL (ref 0–99)
Triglycerides: 125 mg/dL (ref 0–149)
VLDL Cholesterol Cal: 22 mg/dL (ref 5–40)

## 2024-01-25 ENCOUNTER — Ambulatory Visit: Attending: Internal Medicine | Admitting: Internal Medicine

## 2024-01-25 ENCOUNTER — Other Ambulatory Visit: Payer: Self-pay | Admitting: Nurse Practitioner

## 2024-01-25 ENCOUNTER — Ambulatory Visit: Payer: Self-pay | Admitting: Family Medicine

## 2024-01-25 ENCOUNTER — Encounter: Payer: Self-pay | Admitting: Internal Medicine

## 2024-01-25 VITALS — BP 110/70 | HR 72 | Ht 62.0 in | Wt 119.0 lb

## 2024-01-25 DIAGNOSIS — M810 Age-related osteoporosis without current pathological fracture: Secondary | ICD-10-CM

## 2024-01-25 DIAGNOSIS — I2510 Atherosclerotic heart disease of native coronary artery without angina pectoris: Secondary | ICD-10-CM | POA: Diagnosis not present

## 2024-01-25 DIAGNOSIS — R1011 Right upper quadrant pain: Secondary | ICD-10-CM | POA: Diagnosis not present

## 2024-01-25 DIAGNOSIS — E7800 Pure hypercholesterolemia, unspecified: Secondary | ICD-10-CM

## 2024-01-25 LAB — CBC: RBC: 5 (ref 3.87–5.11)

## 2024-01-25 LAB — COMPREHENSIVE METABOLIC PANEL WITH GFR
Albumin: 4.9 (ref 3.5–5.0)
Calcium: 10.6 (ref 8.7–10.7)
eGFR: 89

## 2024-01-25 LAB — BASIC METABOLIC PANEL WITH GFR
BUN: 13 (ref 4–21)
CO2: 33 — AB (ref 13–22)
Chloride: 102 (ref 99–108)
Creatinine: 0.8 (ref 0.5–1.1)
Glucose: 97
Potassium: 4.5 meq/L (ref 3.5–5.1)
Sodium: 139 (ref 137–147)

## 2024-01-25 LAB — CBC AND DIFFERENTIAL
HCT: 45 (ref 36–46)
Hemoglobin: 15.8 (ref 12.0–16.0)
Platelets: 227 K/uL (ref 150–400)
WBC: 5.3

## 2024-01-25 LAB — HEPATIC FUNCTION PANEL
ALT: 26 U/L (ref 7–35)
AST: 26 (ref 13–35)

## 2024-01-25 NOTE — Patient Instructions (Signed)
 Medication Instructions:  No Changes *If you need a refill on your cardiac medications before your next appointment, please call your pharmacy*  Lab Work: Lipid Panel (FASTING) in about 6 months (Due around 07/26/2024); no appointment needed; go to any [facility] or [facility]  Follow-Up: At [facility] you and your health needs are our priority.  As part of our continuing mission to provide you with exceptional heart care, our providers are all part of one team.  This team includes your primary Cardiologist (physician) and Advanced Practice Providers or APPs (Physician Assistants and Nurse Practitioners) who all work together to provide you with the care you need, when you need it.  Your next appointment:   1 year(s)  Provider:   Gayatri A Acharya, MD   Other Instructions Please call us  or send a MyChart message with any Cardiology related questions/concerns.  [phone].  Thank you!

## 2024-01-25 NOTE — Progress Notes (Signed)
 Cardiology Office Note:  .   Date:  01/25/2024  ID:  Amanda Salazar, DOB 08/27/1960, MRN [id number] PCP: Amanda Jenkins Jansky, MD  [facility] Providers Cardiologist:  Amanda DELENA Merck, MD    History of Present Illness: .   Amanda Salazar is a 63 y.o. female.  Discussed the use of AI scribe software for clinical note transcription with the patient, who gave verbal consent to proceed.  History of Present Illness Amanda Salazar is a 63 year old female with hyperlipidemia who presents for follow-up on cholesterol management.  She is on Crestor  5 mg daily and has made dietary changes to manage her cholesterol. Recent labs show an LDL of 96 mg/dL, HDL decreased to 57 mg/dL, and triglycerides increased to 125 mg/dL. Initially, her LDL improved significantly with statin therapy.  She experienced severe back pain in December, suspecting a heart attack, but ER evaluation showed no cardiac issues. She has a history of shortness of breath and presyncope, with a normal echocardiogram. She deferred telemetry monitoring, and neurology suggested vestibular rehabilitation if symptoms recur.  Since retiring in January, she leads a sedentary lifestyle and struggles with portion control, particularly with high-calorie foods. She notes a change in routine and decreased memory function since retirement.    ROS: negative except per HPI above.  Studies Reviewed: SABRA   EKG Interpretation Date/Time:  Thursday January 25 2024 08:28:04 EDT Ventricular Rate:  72 PR Interval:  144 QRS Duration:  70 QT Interval:  362 QTC Calculation: 396 R Axis:   71  Text Interpretation: Normal sinus rhythm Normal ECG When compared with ECG of 19-Jul-2023 14:13, No significant change was found Confirmed by Salazar Amanda ([id number]) on 01/25/2024 8:35:06 AM    Results LABS LDL: 96 mg/dL (93/82/7974) HDL: 57 mg/dL (93/82/7974) Triglycerides: 125 mg/dL (93/82/7974) LDL: 97 mg/dL (95/95/7974) HDL: 70 mg/dL  (95/95/7974) Triglycerides: 62 mg/dL (95/95/7974) Liver function tests: Normal (11/2023) Liver function tests: Normal (07/2023)  RADIOLOGY Abdominal ultrasound: Normal liver and gallbladder (07/2023)  DIAGNOSTIC Echocardiogram: Normal Risk Assessment/Calculations:       Physical Exam:   VS:  BP 110/70   Pulse 72   Ht 5' 2 (1.575 m)   Wt 119 lb (54 kg)   LMP 05/11/2012   SpO2 96%   BMI 21.77 kg/m    Wt Readings from Last 3 Encounters:  01/25/24 119 lb (54 kg)  11/08/23 119 lb (54 kg)  07/19/23 120 lb (54.4 kg)     Physical Exam GENERAL: Alert, cooperative, well developed, no acute distress. HEENT: Normocephalic, normal oropharynx, moist mucous membranes. CHEST: Clear to auscultation bilaterally, no wheezes, rhonchi, or crackles. CARDIOVASCULAR: Normal heart rate and rhythm, S1 and S2 normal without murmurs. ABDOMEN: Soft, non-tender, non-distended, without organomegaly, normal bowel sounds. EXTREMITIES: No cyanosis or edema. NEUROLOGICAL: Cranial nerves grossly intact, moves all extremities without gross motor or sensory deficit.   ASSESSMENT AND PLAN: .    Assessment and Plan Assessment & Plan Hyperlipidemia Suboptimal lipid management with LDL at 96 mg/dL, decreased HDL, and increased triglycerides. She opted for dietary and exercise focus over medication adjustment. - Continue Crestor  5 mg daily. - Implement dietary modifications to reduce saturated fats and increase protein. - Encourage regular exercise, including walking and light strength training. - Re-evaluate cholesterol levels in 6 months.  Coronary artery calcification Coronary artery calcification present. Managed through cholesterol control via diet and exercise. - Continue current hyperlipidemia management.  Osteoporosis Osteoporosis present. Calcium  intake and exercise emphasized for bone health. -  Encourage regular walking for bone health. - Ensure adequate dietary calcium  intake.  I spent 40  minutes in the care of Amanda Salazar today including reviewing labs (11/10/23 and 01/23/24), reviewing studies (echo 06/11/21), face to face time discussing treatment options (management of cholesterol, cardiac risk counseling, 30 minutes), and documenting in the encounter.    Amanda Merck, MD, FACC

## 2024-01-26 LAB — LAB REPORT - SCANNED: EGFR: 89

## 2024-01-28 ENCOUNTER — Ambulatory Visit: Payer: Self-pay | Admitting: Family Medicine

## 2024-01-28 NOTE — Progress Notes (Signed)
 Please abstract these.  Calcium  a little elevated.  If taking calcium , reduce dose

## 2024-01-29 ENCOUNTER — Ambulatory Visit
Admission: RE | Admit: 2024-01-29 | Discharge: 2024-01-29 | Disposition: A | Discharge status: Home / Self Care | Source: Ambulatory Visit | Attending: Nurse Practitioner | Admitting: Nurse Practitioner

## 2024-01-29 DIAGNOSIS — R1011 Right upper quadrant pain: Secondary | ICD-10-CM | POA: Diagnosis not present

## 2024-02-15 ENCOUNTER — Encounter: Payer: Self-pay | Admitting: Internal Medicine

## 2024-02-15 ENCOUNTER — Encounter: Payer: Self-pay | Admitting: Family Medicine

## 2024-02-15 DIAGNOSIS — F5101 Primary insomnia: Secondary | ICD-10-CM

## 2024-02-15 DIAGNOSIS — R0683 Snoring: Secondary | ICD-10-CM

## 2024-02-15 MED ORDER — ROSUVASTATIN CALCIUM 10 MG PO TABS: 10.0000 mg | ORAL_TABLET | Freq: Every day | ORAL | 3 refills | Status: AC

## 2024-02-27 ENCOUNTER — Encounter: Payer: Self-pay | Admitting: Family Medicine

## 2024-02-27 NOTE — Progress Notes (Unsigned)
 LIPASE: 61 CRP QUANT: <1 CA CORRECTED: 9.84 ANION GAP: 9.1 TOTAL PROTEIN: 7.5

## 2024-03-04 NOTE — Telephone Encounter (Signed)
 Called and spoke to pt; gave her Dr. Laurence recommendations. Per patient's husband, when he walks by her room in the early mornings he does hear her snoring loudly many times. Pt states she will ask her daughters about this because she spends time at the beach with them and they have never mentioned it. She also shares that she does not sleep well, usually. It takes her a long time to fall asleep, and then she does not have a great night's sleep and so this causes her to be tired during the days. She does not take anything to help with sleep.

## 2024-03-07 NOTE — Telephone Encounter (Signed)
 Called and spoke to pt; Dr. Laurence rec's given. She agrees to [facility] test for now. She states she does not feel that she has OSA and that she spoke to her daughters who said they have not heard her snoring. She clarified from previous phone call and states she has had some bad evening habits and this has caused her to not be able to initiate her sleep, but once she is asleep, she feels that she has a good, solid amount of sleep, usually. She wonders if the elevated Hgb is r/t her SOB that she has experienced over the last few years.   Will order [facility]; this will be mailed to her and someone with the Sleep team/coordinators will get in touch with her regarding it (pt requesting someone call her to review set up/initiation).   No other concerns for now.

## 2024-03-07 NOTE — Addendum Note (Signed)
 Addended by: Valicia Rief C on: 03/07/2024 10:32 AM   Modules accepted: Orders

## 2024-03-26 DIAGNOSIS — H35371 Puckering of macula, right eye: Secondary | ICD-10-CM | POA: Diagnosis not present

## 2024-04-03 ENCOUNTER — Encounter: Payer: 59 | Admitting: Nurse Practitioner

## 2024-04-10 ENCOUNTER — Encounter: Admitting: Family Medicine

## 2024-04-16 DIAGNOSIS — H16143 Punctate keratitis, bilateral: Secondary | ICD-10-CM | POA: Diagnosis not present

## 2024-04-24 ENCOUNTER — Telehealth: Payer: Self-pay | Admitting: *Deleted

## 2024-04-24 ENCOUNTER — Ambulatory Visit: Payer: Self-pay

## 2024-04-24 NOTE — Telephone Encounter (Signed)
 Copied from CRM #[id number]. Topic: General - Other >> Apr 24, 2024  1:45 PM Thersia C wrote: Reason for CRM: Patient called in received a missed call from the office would like a callback  Returned call to patient and informed her per pcp: She would need an appt-there would need to be notes, etc for MRI orders.

## 2024-04-24 NOTE — Telephone Encounter (Signed)
 Patient notified. Is it okay to do a virtual visit due to the pain and trying to get dressed? Please advise.

## 2024-04-24 NOTE — Telephone Encounter (Signed)
 FYI Only or Action Required?: Action required by provider: clinical question for provider.  Patient was last seen in primary care on 11/08/2023 by Wendolyn Jenkins Jansky, MD.  Called Nurse Triage reporting Back Pain.  Symptoms began several days ago.  Interventions attempted: Prescription medications: Nabumetone  and Ice/heat application.  Symptoms are: gradually worsening.  Triage Disposition: See PCP When Office is Open (Within 3 Days)  Patient/caregiver understands and will follow disposition?: No, wishes to speak with PCP      Copied from CRM #[id number]. Topic: Clinical - Red Word Triage >> Apr 24, 2024 12:43 PM Mesmerise C wrote: [facility] that prompted transfer to Nurse Triage: Patient stated she's having an issues with her lower back since Friday has limited her mobility feels in her hip and afraid it'll move to her leg states would need an MRI done, states she doesn't feel it when she lays flat and still but when she moves around she feels it      Reason for Disposition  [1] MODERATE back pain (e.g., interferes with normal activities) AND [2] present > 3 days  Answer Assessment - Initial Assessment Questions Patient reached out to her neurosurgeon but is unable to get an appointment until December and was advised to reach out to her PCP for an MRI order. Patient declined an appointment due to her pain but would like to know if she could get an order without being seen. Please advise.       1. ONSET: When did the pain begin? (e.g., minutes, hours, days)     5 days ago  2. LOCATION: Where does it hurt? (upper, mid or lower back)     Lower back pain  3. SEVERITY: How bad is the pain?  (e.g., Scale 1-10; mild, moderate, or severe)     Mild to moderate but has intermittent sharp pain 4. PATTERN: Is the pain constant? (e.g., yes, no; constant, intermittent)      Constant, but has relief when laying flat  5. RADIATION: Does the pain shoot into your legs or somewhere  else?     Bilateral hips, left worse than right  6. CAUSE:  What do you think is causing the back pain?      Unsure but had similar pain about 20 years ago 7. BACK OVERUSE:  Any recent lifting of heavy objects, strenuous work or exercise?     No 8. MEDICINES: What have you taken so far for the pain? (e.g., nothing, acetaminophen , NSAIDS)     Nabumetone   9. NEUROLOGIC SYMPTOMS: Do you have any weakness, numbness, or problems with bowel/bladder control?     No 10. OTHER SYMPTOMS: Do you have any other symptoms? (e.g., fever, abdomen pain, burning with urination, blood in urine)       No  Protocols used: Back Pain-A-AH

## 2024-04-25 ENCOUNTER — Encounter: Payer: Self-pay | Admitting: Family Medicine

## 2024-04-25 ENCOUNTER — Ambulatory Visit

## 2024-04-25 ENCOUNTER — Ambulatory Visit: Admitting: Family Medicine

## 2024-04-25 ENCOUNTER — Ambulatory Visit (INDEPENDENT_AMBULATORY_CARE_PROVIDER_SITE_OTHER): Admitting: Family Medicine

## 2024-04-25 VITALS — BP 112/76 | HR 72 | Temp 97.6°F | Resp 16 | Ht 62.0 in | Wt 118.5 lb

## 2024-04-25 DIAGNOSIS — M5432 Sciatica, left side: Secondary | ICD-10-CM

## 2024-04-25 DIAGNOSIS — M47816 Spondylosis without myelopathy or radiculopathy, lumbar region: Secondary | ICD-10-CM | POA: Diagnosis not present

## 2024-04-25 DIAGNOSIS — M48061 Spinal stenosis, lumbar region without neurogenic claudication: Secondary | ICD-10-CM | POA: Diagnosis not present

## 2024-04-25 DIAGNOSIS — M5431 Sciatica, right side: Secondary | ICD-10-CM | POA: Diagnosis not present

## 2024-04-25 MED ORDER — NABUMETONE 500 MG PO TABS: 500.0000 mg | ORAL_TABLET | Freq: Two times a day (BID) | ORAL | 1 refills | Status: AC | PRN

## 2024-04-25 NOTE — Progress Notes (Addendum)
 Subjective:     Patient ID: Amanda Salazar, female    DOB: 07-12-1961, 63 y.o.   MRN: [id number]  Chief Complaint  Patient presents with   Referral    Requesting referral for MRI due to lower back pain, pain in hips radiating down legs and to abdomen    Abdominal Pain    HPI Amanda Salazar Discussed the use of AI scribe software for clinical note transcription with the patient, who gave verbal consent to proceed.  History of Present Illness Amanda Salazar is a 63 year old female with lumbar disc degeneration who presents with low back pain radiating to the legs.  She has been experiencing low back pain radiating to her buttocks and legs, with the left side being slightly worse. This pain began 21 years ago after the birth of her fourth child, initially causing severe symptoms that required the use of crutches. At that time, she recalls being told by the neurosurgeon that her MRI showed degeneration of the lumbar discs at L4 and L5. She has managed the condition with rest and medication, specifically nabumetone , which she has used intermittently over the years when symptoms flare up.  Recently, she experienced a significant increase in pain, making it difficult to get out of her low-seated car. The pain is described as dull and persistent, with some numbness in the right anterior thigh. She denies any specific injury or fall but notes increased physical activity over the summer, including trips to the beach and moving household items, which may have contributed to the exacerbation of her symptoms.  She has been using a heating pad and has tried to rest by lying flat, although she finds it difficult to sleep on her back. She reports difficulty sitting comfortably and has set up different seating arrangements at home to alleviate discomfort. No bowel or bladder dysfunction, significant weakness, or numbness in the perineal area. Her current medication includes nabumetone , which she uses as needed  for pain management. She has not been eating much due to lack of appetite and difficulty preparing food.    There are no preventive care reminders to display for this patient.  Past Medical History:  Diagnosis Date   ADD (attention deficit disorder)    Allergy    Anxiety    Arthritis    Atypical nevus 01/11/1995   Medial Lower Abdomen - Slight   Basal cell carcinoma 09/07/2020   nod- left temporal scalp   BCC (basal cell carcinoma of skin) 04/30/2008   Right Neck   Blurred vision    Cataract    Mixed form OU   Heart murmur    Hemangioma    on liver   Hyperlipidemia    Osteoporosis    Seasonal allergies     Past Surgical History:  Procedure Laterality Date   BREAST EXCISIONAL BIOPSY Bilateral    BREAST SURGERY     biopsy x 3   CESAREAN SECTION     x 4   EYE SURGERY Bilateral    RK, torn retina     Current Outpatient Medications:    alendronate  (FOSAMAX ) 70 MG tablet, Take 1 tablet (70 mg total) by mouth once a week. Take with a full glass of water on an empty stomach., Disp: 12 tablet, Rfl: 3   Cholecalciferol 125 MCG (5000 UT) TABS, 1 tablet Orally Once a day, Disp: , Rfl:    Cyanocobalamin (VITAMIN B 12 PO), Take by mouth daily., Disp: , Rfl:    cycloSPORINE (  RESTASIS) 0.05 % ophthalmic emulsion, Place 1 drop into both eyes 2 (two) times daily., Disp: , Rfl:    estradiol (ESTRACE) 0.1 MG/GM vaginal cream, Place vaginally., Disp: , Rfl:    hydroquinone  4 % cream, APPLY TOPICALLY TWICE A DAY, Disp: 28.35 g, Rfl: 3   ketoconazole (NIZORAL) 2 % shampoo, Apply 1 Application topically 2 (two) times a week., Disp: , Rfl:    minoxidil (ROGAINE) 2 % external solution, Apply topically 2 (two) times daily., Disp: , Rfl:    nitrofurantoin , macrocrystal-monohydrate, (MACROBID ) 100 MG capsule, As Needed, Disp: , Rfl:    Polyethylene Glycol 400 (BLINK TEARS OP), Apply to eye., Disp: , Rfl:    rosuvastatin  (CRESTOR ) 10 MG tablet, Take 1 tablet (10 mg total) by mouth daily., Disp:  90 tablet, Rfl: 3   tretinoin (RETIN-A) 0.05 % cream, APPLY TO AFFECTED AREA EVERY DAY AT BEDTIME, Disp: 45 g, Rfl: 5   Turmeric 500 MG CAPS, 1-2 capsules Orally daily, Disp: , Rfl:    nabumetone  (RELAFEN ) 500 MG tablet, Take 1 tablet (500 mg total) by mouth 2 (two) times daily as needed., Disp: 90 tablet, Rfl: 1  Allergies  Allergen Reactions   Iodine Anaphylaxis   Red Dye #40 (Allura Red) Anaphylaxis and Other (See Comments)   Erythromycin Nausea And Vomiting and Nausea Only   Penicillins Other (See Comments)   Sulfa Antibiotics Other (See Comments)   Iodinated Contrast Media Other (See Comments)    No true allergy.  Had a syncopal episode while having a procedure   ROS neg/noncontributory except as noted HPI/below      Objective:     BP 112/76   Pulse 72   Temp 97.6 F (36.4 C) (Temporal)   Resp 16   Ht 5' 2 (1.575 m)   Wt 118 lb 8 oz (53.8 kg)   LMP 05/11/2012   SpO2 97%   BMI 21.67 kg/m  Wt Readings from Last 3 Encounters:  04/25/24 118 lb 8 oz (53.8 kg)  01/25/24 119 lb (54 kg)  11/08/23 119 lb (54 kg)    Physical Exam   Gen: WDWN NAD HEENT: NCAT, conjunctiva not injected, sclera nonicteric ABDOMEN:  BS+, soft, NTND, No HSM, no masses EXT:  no edema MSK: no gross abnormalities.  NEURO: A&O x3.  CN II-XII intact.  PSYCH: normal mood. Good eye contact Back:  can stand on heels/toes/1 leg.  Some lumbar TTP lower. + R SI tenderness. No rash.  MS 5/5 BLE.  DTR 2+ BLE.  SLR neg B.  Good ROM - w/some pain in all directions     Assessment & Plan:  Bilateral sciatica -     DG Lumbar Spine 2-3 Views; Future -     MR LUMBAR SPINE WO CONTRAST; Future  Other orders -     Nabumetone ; Take 1 tablet (500 mg total) by mouth 2 (two) times daily as needed.  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 Pain is primarily in the low back and buttocks, radiating to the legs, with the left side more affected. There is concern for potential disc degeneration at L4-L5. Symptoms include dull pain, difficulty sitting, and numbness in the right anterior thigh. No significant weakness, bowel or bladder dysfunction, or saddle anesthesia is present. Differential diagnosis includes lumbar disc pathology, hip pathology, or  osteoporosis-related compression fracture. An MRI is desired to assess the current status, but insurance may require physical therapy first. It was discussed that MRIs may not correlate with symptoms. Order a lumbar spine x-ray to rule out compression fracture.or other abn.  Attempt to authorize an MRI of the lumbar spine. Refer to physical therapy for back pain management(pt wants to hold depending on MRI). Refill nabumetone  prescription for pain management.  Osteoporosis   Osteoporosis is present with concern for its potential impact on current back pain. A bone density test is scheduled to assess the current status. No recent fractures have been reported. Continue osteoporosis management as per the current regimen. Ensure the bone density test is completed as scheduled.   Return for as sch 10/3.  Jenkins CHRISTELLA Carrel, MD

## 2024-04-25 NOTE — Patient Instructions (Addendum)
 It was very nice to see you today!  [facility] at [facility]  [address] on the 1st floor Phone number [phone]    PLEASE NOTE:  If you had any lab tests please let us know if you have not heard back within a few days. You may see your results on MyChart before we have a chance to review them but we will give you a call once they are reviewed by [place]. If we ordered any referrals today, please let us know if you have not heard from their office within the next week.   Please try these tips to maintain a healthy lifestyle:  Eat most of your calories during the day when you are active. Eliminate processed foods including packaged sweets (pies, cakes, cookies), reduce intake of potatoes, white bread, white pasta, and white rice. Look for whole grain options, oat flour or almond flour.  Each meal should contain half fruits/vegetables, one quarter protein, and one quarter carbs (no bigger than a computer mouse).  Cut down on sweet beverages. This includes juice, soda, and sweet tea. Also watch fruit intake, though this is a healthier sweet option, it still contains natural sugar! Limit to 3 servings daily.  Drink at least 1 glass of water with each meal and aim for at least 8 glasses per day  Exercise at least 150 minutes every week.

## 2024-04-29 ENCOUNTER — Ambulatory Visit (HOSPITAL_BASED_OUTPATIENT_CLINIC_OR_DEPARTMENT_OTHER)
Admission: RE | Admit: 2024-04-29 | Discharge: 2024-04-29 | Disposition: A | Discharge status: Home / Self Care | Source: Ambulatory Visit | Attending: Family Medicine | Admitting: Family Medicine

## 2024-04-29 DIAGNOSIS — M810 Age-related osteoporosis without current pathological fracture: Secondary | ICD-10-CM | POA: Diagnosis not present

## 2024-05-01 ENCOUNTER — Ambulatory Visit: Payer: Self-pay | Admitting: Family Medicine

## 2024-05-01 NOTE — Progress Notes (Signed)
 Some mild degenerative changes.  Will see what MRI shows

## 2024-05-03 ENCOUNTER — Ambulatory Visit (HOSPITAL_BASED_OUTPATIENT_CLINIC_OR_DEPARTMENT_OTHER)
Admission: RE | Admit: 2024-05-03 | Discharge: 2024-05-03 | Disposition: A | Discharge status: Home / Self Care | Source: Ambulatory Visit | Attending: Family Medicine | Admitting: Family Medicine

## 2024-05-03 DIAGNOSIS — M5432 Sciatica, left side: Secondary | ICD-10-CM | POA: Diagnosis not present

## 2024-05-03 DIAGNOSIS — M5116 Intervertebral disc disorders with radiculopathy, lumbar region: Secondary | ICD-10-CM | POA: Diagnosis not present

## 2024-05-03 DIAGNOSIS — M5431 Sciatica, right side: Secondary | ICD-10-CM | POA: Insufficient documentation

## 2024-05-03 DIAGNOSIS — M48061 Spinal stenosis, lumbar region without neurogenic claudication: Secondary | ICD-10-CM | POA: Diagnosis not present

## 2024-05-03 DIAGNOSIS — M549 Dorsalgia, unspecified: Secondary | ICD-10-CM | POA: Diagnosis not present

## 2024-05-03 DIAGNOSIS — M4726 Other spondylosis with radiculopathy, lumbar region: Secondary | ICD-10-CM | POA: Diagnosis not present

## 2024-05-05 ENCOUNTER — Ambulatory Visit: Payer: Self-pay | Admitting: Family Medicine

## 2024-05-05 NOTE — Progress Notes (Signed)
 Refer Dr. Haroldine lot of disc disease and pain

## 2024-05-05 NOTE — Progress Notes (Signed)
 Bone density has improved.  Continue meds

## 2024-05-06 NOTE — Telephone Encounter (Signed)
 Spoke with patient about bone density results. Patient acknowledged understanding.

## 2024-05-06 NOTE — Telephone Encounter (Signed)
-----   Message from Jenkins CHRISTELLA Carrel sent at 05/05/2024  5:40 PM EDT ----- Bone density has improved.  Continue meds ----- Message ----- From: Interface, Rad Results In Sent: 04/29/2024  10:51 AM EDT To: Jenkins Earnie Carrel, MD

## 2024-05-07 NOTE — Progress Notes (Signed)
 [facility] - Clinic Note  05/21/2024     CHIEF COMPLAINT Patient presents for Retina Follow Up    HISTORY OF PRESENT ILLNESS: Amanda Salazar is a 63 y.o. female who presents to the clinic today for:   HPI     Retina Follow Up   In right eye.  This started 1 year ago.  Duration of 1 year.  Since onset it is gradually worsening.  I, the attending physician,  performed the HPI with the patient and updated documentation appropriately.        Comments   Pt states she has been working with an optometrist to get her dry eye under control before updating her Mrx. Pt had RK sx and the ruts drink up all the water. Pt has had 3 light treatments and the office has tried massaging her meibomian glands to clear them out. Pt is using a warm mask 10-12 minutes and then massage lower lids-she wants to make sure this will not cause harm to her eye. Pt has been using the bliss supplement for dry eye as well as ATS tid OU and a gel qpm. Pt has follow up tomorrow to see her progress. Pt denies FOL/floaters/pain.      Last edited by Valdemar Rogue, MD on 05/26/2024  4:04 PM.     Pt states  Referring physician: Tonita Fallow, MD No address on file  HISTORICAL INFORMATION:   Selected notes from the MEDICAL RECORD NUMBER Referred by Dr. Alvia for possible RD LEE- 04.27.18 (JDM) [BCVA OD: 20/20-1 OS: 20/20-1]   CURRENT MEDICATIONS: Current Outpatient Medications (Ophthalmic Drugs)  Medication Sig   cycloSPORINE (RESTASIS) 0.05 % ophthalmic emulsion Place 1 drop into both eyes 2 (two) times daily.   Polyethylene Glycol 400 (BLINK TEARS OP) Apply to eye.   No current facility-administered medications for this visit. (Ophthalmic Drugs)   Current Outpatient Medications (Other)  Medication Sig   alendronate  (FOSAMAX ) 70 MG tablet Take 1 tablet (70 mg total) by mouth once a week. Take with a full glass of water on an empty stomach.   Cholecalciferol 125 MCG (5000 UT)  TABS 1 tablet Orally Once a day   Cyanocobalamin (VITAMIN B 12 PO) Take by mouth daily.   estradiol (ESTRACE) 0.1 MG/GM vaginal cream Place vaginally.   hydroquinone  4 % cream APPLY TOPICALLY TWICE A DAY   hydrOXYzine (VISTARIL) 25 MG capsule Take 1 capsule (25 mg total) by mouth at bedtime as needed.   ketoconazole (NIZORAL) 2 % shampoo Apply 1 Application topically 2 (two) times a week.   minoxidil (ROGAINE) 2 % external solution Apply topically 2 (two) times daily.   nabumetone  (RELAFEN ) 500 MG tablet Take 1 tablet (500 mg total) by mouth 2 (two) times daily as needed.   nitrofurantoin , macrocrystal-monohydrate, (MACROBID ) 100 MG capsule As Needed   rosuvastatin  (CRESTOR ) 10 MG tablet Take 1 tablet (10 mg total) by mouth daily.   tretinoin (RETIN-A) 0.05 % cream APPLY TO AFFECTED AREA EVERY DAY AT BEDTIME   Turmeric 500 MG CAPS 1-2 capsules Orally daily   No current facility-administered medications for this visit. (Other)   REVIEW OF SYSTEMS: ROS   Positive for: Gastrointestinal, Eyes, Psychiatric, Heme/Lymph Negative for: Constitutional, Neurological, Skin, Genitourinary, Musculoskeletal, HENT, Endocrine, Cardiovascular, Respiratory, Allergic/Imm Last edited by Elnor Avelina RAMAN, COT on 05/21/2024  3:10 PM.     ALLERGIES Allergies  Allergen Reactions   Iodine Anaphylaxis   Red Dye #40 (Allura Red) Anaphylaxis and Other (  See Comments)   Erythromycin Nausea And Vomiting and Nausea Only   Penicillins Other (See Comments)   Sulfa Antibiotics Other (See Comments)   Iodinated Contrast Media Other (See Comments)    No true allergy.  Had a syncopal episode while having a procedure   PAST MEDICAL HISTORY Past Medical History:  Diagnosis Date   ADD (attention deficit disorder)    Allergy    Anxiety    Arthritis    Atypical nevus 01/11/1995   Medial Lower Abdomen - Slight   Basal cell carcinoma 09/07/2020   nod- left temporal scalp   BCC (basal cell carcinoma of skin) 04/30/2008    Right Neck   Blurred vision    Cataract    Mixed form OU   Heart murmur    Hemangioma    on liver   Hyperlipidemia    Osteoporosis    Seasonal allergies    Past Surgical History:  Procedure Laterality Date   BREAST EXCISIONAL BIOPSY Bilateral    BREAST SURGERY     biopsy x 3   CESAREAN SECTION     x 4   EYE SURGERY Bilateral    RK, torn retina   FAMILY HISTORY Family History  Problem Relation Age of Onset   Hearing loss Mother    Arthritis Mother    Hyperlipidemia Mother    Hypertension Mother    Atrial fibrillation Mother    Arthritis Father    Hyperlipidemia Father    Stroke Father    Depression Sister    Heart disease Brother    Hypertension Brother    Atrial fibrillation Brother    BRCA 1/2 Neg Hx    Breast cancer Neg Hx    SOCIAL HISTORY Social History   Tobacco Use   Smoking status: Never   Smokeless tobacco: Never  Vaping Use   Vaping status: Never Used  Substance Use Topics   Alcohol use: Yes    Alcohol/week: 8.0 standard drinks of alcohol    Types: 8 Glasses of wine per week    Comment: glass wine most nights   Drug use: Not Currently    Types: Marijuana    Comment: used in the past       OPHTHALMIC EXAM:  Base Eye Exam     Visual Acuity (Snellen - Linear)       Right Left   Dist [place] 20/50 -2 20/40   Dist ph [place] 20/30 -2 20/20 -1         Tonometry (Tonopen, 3:20 PM)       Right Left   Pressure 18 16         Pupils       Pupils Dark Light Shape React APD   Right PERRL 3 2 Round Brisk None   Left PERRL 3 2 Round Brisk None         Visual Fields       Left Right    Full Full         Extraocular Movement       Right Left    Full, Ortho Full, Ortho         Neuro/Psych     Oriented x3: Yes   Mood/Affect: Normal         Dilation     Both eyes: 1.0% Mydriacyl, 2.5% Phenylephrine @ 3:20 PM           Slit Lamp and Fundus Exam     Slit Lamp Exam  Right Left   Lids/Lashes Dermatochalasis -  upper lid Dermatochalasis - upper lid   Conjunctiva/Sclera White and quiet White and quiet   Cornea Arcus, 4 RK scars at 1200, 0300, 0600, and 0900, 1-2+ Punctate epithelial erosions, tear film debris Arcus, 1-2+ Punctate epithelial erosions, 4 RK scars at 1100, 0200, 0500, 0800   Anterior Chamber deep, clear, narrow temporal angle clear, narrow temporal angle   Iris Round and dilated Round and dilated   Lens 2+ Nuclear sclerosis, 2-3+ Cortical cataract 2+ Nuclear sclerosis, 2-3+ Cortical cataract   Anterior Vitreous Vitreous syneresis, Posterior vitreous detachment, Weiss ring, no pigment, vitreous condensations Vitreous syneresis, Posterior vitreous detachment, Weiss ring, vitreous condensations         Fundus Exam       Right Left   Disc Pink and Sharp, Compact Pink and Sharp, Compact   C/D Ratio 0.4 0.4   Macula Flat, good foveal reflex, nasal epiretinal membrane with striae and mild central thickening - stable from prior, No heme Flat, Good foveal reflex, mild Retinal pigment epithelial mottling, No heme or edema   Vessels attenuated, mild tortuosity attenuated, Tortuous   Periphery Attached, small hole with good laser surrounding at 1100, No new RT/RD, No heme Attached, small horseshoe tear at 0900 - good laser changes surrounding, No new RT/RD            IMAGING AND PROCEDURES  Imaging and Procedures for 11/17/17  OCT, Retina - OU - Both Eyes       Right Eye Quality was good. Central Foveal Thickness: 488. Progression has been stable. 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   Left Eye Quality was good. Central Foveal Thickness: 243. Progression has been stable. Findings include normal foveal contour, no IRF, no SRF.   Notes *Images captured and stored on drive  Diagnosis /  Impression:  OD: Focal ERM nasal macula and fovea with pucker and PRF, thickening centrally and nasal fovea -- stable from prior OS: NFP, No IRF/SRF  Clinical management:  See below  Abbreviations: NFP - Normal foveal profile. CME - cystoid macular edema. PED - pigment epithelial detachment. IRF - intraretinal fluid. SRF - subretinal fluid. EZ - ellipsoid zone. ERM - epiretinal membrane. ORA - outer retinal atrophy. ORT - outer retinal tubulation. SRHM - subretinal hyper-reflective material              ASSESSMENT/PLAN:    ICD-10-CM   1. Epiretinal membrane (ERM) of right eye  H35.371 OCT, Retina - OU - Both Eyes    2. Retinal tear of left eye  H33.312     3. History of repair of retinal tear by laser photocoagulation  Z98.890     4. Posterior vitreous detachment of both eyes  H43.813     5. Combined forms of age-related cataract of both eyes  H25.813     6. Dry eyes  H04.123       1. Epiretinal membrane, OD -- stable - BCVA 20/30 from 20/25 - OCT shows Focal ERM nasal macula and fovea with pucker and PRF, thickening centrally and nasal fovea -- stable from prior  - no indication for surgery at this time - continue to monitor - f/u 1 yr, sooner prn -- DFE/OCT  2. retinal tear, OS    - small horseshoe tear located at 0900 --  no SRF  - S/P laser retinopexy OS (03.11.19) - good laser changes  - no new tears or detachment on repeat 360 peripheral exam  - monitor  3. History of retinal tear OD  - 1100 oclock position  - s/p laser retinopexy with Dr. Alvia  - stable  - continue to monitor  4. PVD OU  - prominent floaters OU (OS > OD)  - Discussed findings and prognosis  - history of RT OU as above - No new RT or RD on 360 exam  - Reviewed s/s of RT/RD  - strict return precaution for any such RT/RD symptoms  5. Combined form age-related cataract OU   - The symptoms of cataract, surgical options, and treatments and risks were discussed with patient.  -  discussed diagnosis and progression  - under the expert management of Dr. Meridee  - clear from a retina standpoint to proceed with cataract surgery when both patient and surgeon are ready  6. Dry eyes  - under the expert management of Dr. Meridee and Dr. Verlinda reports having three treatments for dry eye OOP.  Ophthalmic Meds Ordered this visit:  No orders of the defined types were placed in this encounter.    Return in about 1 year (around 05/21/2025) for ERM OD, DFE, OCT.  There are no Patient Instructions on file for this visit.  This document serves as a record of services personally performed by Redell JUDITHANN Hans, MD, PhD. It was created on their behalf by Wanda GEANNIE Keens, COT an ophthalmic technician. The creation of this record is the provider's dictation and/or activities during the visit.    Electronically signed by:  Wanda GEANNIE Keens, COT  05/26/24 4:05 PM  This document serves as a record of services personally performed by Redell JUDITHANN Hans, MD, PhD. It was created on their behalf by Almetta Pesa, an ophthalmic technician. The creation of this record is the provider's dictation and/or activities during the visit.    Electronically signed by: Almetta Pesa, OA, 05/26/24  4:05 PM  Redell JUDITHANN Hans, M.D., Ph.D. Diseases & Surgery of the Retina and Vitreous [facility]  I have reviewed the above documentation for accuracy and completeness, and I agree with the above. Redell JUDITHANN Hans, M.D., Ph.D. 05/26/24 4:06 PM   Abbreviations: M myopia (nearsighted); A astigmatism; H hyperopia (farsighted); P presbyopia; Mrx spectacle prescription;  CTL contact lenses; OD right eye; OS left eye; OU both eyes  XT exotropia; ET esotropia; PEK punctate epithelial keratitis; PEE punctate epithelial erosions; DES dry eye syndrome; MGD meibomian gland dysfunction; ATs artificial tears; PFAT's preservative free artificial tears; NSC nuclear sclerotic  cataract; PSC posterior subcapsular cataract; ERM epi-retinal membrane; PVD posterior vitreous detachment; RD retinal detachment; DM diabetes mellitus; DR diabetic retinopathy; NPDR non-proliferative diabetic retinopathy; PDR proliferative diabetic retinopathy; CSME clinically significant macular edema; DME diabetic macular edema; dbh dot blot hemorrhages; CWS cotton wool spot; POAG primary open angle glaucoma; C/D cup-to-disc ratio; HVF humphrey visual field; GVF goldmann visual field; OCT optical coherence tomography; IOP intraocular pressure; BRVO Branch retinal vein occlusion; CRVO central retinal vein occlusion; CRAO central retinal artery occlusion; BRAO branch retinal artery occlusion; RT retinal tear; SB scleral buckle; PPV pars plana vitrectomy; VH Vitreous hemorrhage; PRP panretinal laser photocoagulation; IVK intravitreal kenalog ; VMT vitreomacular traction; MH Macular hole;  NVD neovascularization of the disc; NVE neovascularization elsewhere; AREDS age related eye disease study; ARMD age related macular degeneration; POAG primary open angle glaucoma; EBMD epithelial/anterior basement  membrane dystrophy; ACIOL anterior chamber intraocular lens; IOL intraocular lens; PCIOL posterior chamber intraocular lens; Phaco/IOL phacoemulsification with intraocular lens placement; PRK photorefractive keratectomy; LASIK laser assisted in situ keratomileusis; HTN hypertension; DM diabetes mellitus; COPD chronic obstructive pulmonary disease

## 2024-05-10 ENCOUNTER — Ambulatory Visit: Admitting: Family Medicine

## 2024-05-10 ENCOUNTER — Encounter: Payer: Self-pay | Admitting: Family Medicine

## 2024-05-10 VITALS — BP 110/60 | HR 75 | Temp 97.6°F | Ht 62.0 in | Wt 117.0 lb

## 2024-05-10 DIAGNOSIS — Z0000 Encounter for general adult medical examination without abnormal findings: Secondary | ICD-10-CM

## 2024-05-10 DIAGNOSIS — M810 Age-related osteoporosis without current pathological fracture: Secondary | ICD-10-CM

## 2024-05-10 DIAGNOSIS — E559 Vitamin D deficiency, unspecified: Secondary | ICD-10-CM

## 2024-05-10 DIAGNOSIS — F411 Generalized anxiety disorder: Secondary | ICD-10-CM | POA: Diagnosis not present

## 2024-05-10 DIAGNOSIS — E7800 Pure hypercholesterolemia, unspecified: Secondary | ICD-10-CM

## 2024-05-10 DIAGNOSIS — F5101 Primary insomnia: Secondary | ICD-10-CM

## 2024-05-10 MED ORDER — HYDROXYZINE PAMOATE 25 MG PO CAPS: 25.0000 mg | ORAL_CAPSULE | Freq: Every evening | ORAL | 0 refills | Status: AC | PRN

## 2024-05-10 NOTE — Patient Instructions (Addendum)
 It was very nice to see you today!  Magnesium glycinate near bedtime  Hydroxyzine at bed for 1 wk    PLEASE NOTE:  If you had any lab tests please let us  know if you have not heard back within a few days. You may see your results on MyChart before we have a chance to review them but we will give you a call once they are reviewed by us . If we ordered any referrals today, please let us  know if you have not heard from their office within the next week.   Please try these tips to maintain a healthy lifestyle:  Eat most of your calories during the day when you are active. Eliminate processed foods including packaged sweets (pies, cakes, cookies), reduce intake of potatoes, white bread, white pasta, and white rice. Look for whole grain options, oat flour or almond flour.  Each meal should contain half fruits/vegetables, one quarter protein, and one quarter carbs (no bigger than a computer mouse).  Cut down on sweet beverages. This includes juice, soda, and sweet tea. Also watch fruit intake, though this is a healthier sweet option, it still contains natural sugar! Limit to 3 servings daily.  Drink at least 1 glass of water with each meal and aim for at least 8 glasses per day  Exercise at least 150 minutes every week.

## 2024-05-10 NOTE — Progress Notes (Signed)
 Phone [phone]   Subjective:   Patient is a 63 y.o. female presenting for annual physical.    Chief Complaint  Patient presents with   Annual Exam    Physical;    Annual-not exercising Discussed the use of AI scribe software for clinical note transcription with the patient, who gave verbal consent to proceed.  History of Present Illness Amanda Salazar is a 63 year old female with coronary artery disease who presents for an annual physical exam.  She experiences fatigue and sleep disturbances, which she attributes to assisting her daughter with moving and subsequent lack of sleep. She describes feeling 'worn out' and 'sleepy' rather than physically tired. She reports that a previous test showed calcium  deposits, and her doctor told her this was considered coronary artery disease. She is on rosuvastatin  10 mg, which was increased after her last cardiologist visit in June. No side effects from the medication are reported.  She has back pain associated with recent physical activity related to moving and is cautious about overexerting herself. She also has ongoing knee problems, which she manages with turmeric supplements. Awaiting appt w/Dr. Colon  She reports sleep disturbances, including difficulty maintaining a regular sleep cycle, attributing some issues to anxiety and stress related to family matters and recent life changes, such as retirement. She previously received a prescription for an antidepressant to aid sleep but chose not to take it due to concerns about dependency. She is considering non-pharmacological methods to improve her sleep cycle.  She has a history of dry eyes and vision problems, including issues related to past RK surgery and retinal concerns. She is currently receiving light treatments for her eyes.  In terms of her social history, she recently retired and is experiencing stress related to her children's life choices and global events. She describes herself as  a 'worrier' and is concerned about her cognitive health, fearing memory loss. She engages in activities to stimulate her brain, such as puzzles and spreadsheets.  No headaches, dizziness, syncope, diplopia, blurry vision related to her medication, chest pains, palpitations, cough, wheezing, dyspnea, vomiting, diarrhea, constipation, or urinary issues. She reports anxiety and stress but denies suicidal thoughts.    See problem oriented charting- ROS- ROS: Gen: no fever, chills  Skin: no rash, itching ENT: no ear pain, ear drainage, nasal congestion, rhinorrhea, sinus pressure, sore throat Eyes: no blurry vision, double vision.  Dry eyes Resp: no cough, wheeze,SOB CV: no CP, palpitations, LE edema,  GI: labs from GI in June for liver.   GU: no dysuria, urgency, frequency, hematuria MSK: chronic. Neuro: no dizziness, headache, weakness, vertigo Psych: no  SI.  Worries a lot  The following were reviewed and entered/updated in epic: Past Medical History:  Diagnosis Date   ADD (attention deficit disorder)    Allergy    Anxiety    Arthritis    Atypical nevus 01/11/1995   Medial Lower Abdomen - Slight   Basal cell carcinoma 09/07/2020   nod- left temporal scalp   BCC (basal cell carcinoma of skin) 04/30/2008   Right Neck   Blurred vision    Cataract    Mixed form OU   Heart murmur    Hemangioma    on liver   Hyperlipidemia    Osteoporosis    Seasonal allergies    Patient Active Problem List   Diagnosis Date Noted   Primary insomnia 11/08/2023   Age-related osteoporosis without current pathological fracture 04/04/2023   Vitamin D  deficiency 04/04/2023  Medication management 04/04/2023   Vertigo 04/04/2023   ADD (attention deficit disorder) 12/21/2015   Generalized anxiety disorder 04/30/2015   Dense breast tissue 04/29/2014   Basal cell carcinoma 04/29/2014   Hyperlipidemia 01/08/2008   GERD 01/08/2008   Allergic rhinitis 08/20/2007   Past Surgical History:   Procedure Laterality Date   BREAST EXCISIONAL BIOPSY Bilateral    BREAST SURGERY     biopsy x 3   CESAREAN SECTION     x 4   EYE SURGERY Bilateral    RK, torn retina    Family History  Problem Relation Age of Onset   Hearing loss Mother    Arthritis Mother    Hyperlipidemia Mother    Hypertension Mother    Atrial fibrillation Mother    Arthritis Father    Hyperlipidemia Father    Stroke Father    Depression Sister    Heart disease Brother    Hypertension Brother    Atrial fibrillation Brother    BRCA 1/2 Neg Hx    Breast cancer Neg Hx     Medications- reviewed and updated Current Outpatient Medications  Medication Sig Dispense Refill   alendronate  (FOSAMAX ) 70 MG tablet Take 1 tablet (70 mg total) by mouth once a week. Take with a full glass of water on an empty stomach. 12 tablet 3   Cholecalciferol 125 MCG (5000 UT) TABS 1 tablet Orally Once a day     Cyanocobalamin (VITAMIN B 12 PO) Take by mouth daily.     cycloSPORINE (RESTASIS) 0.05 % ophthalmic emulsion Place 1 drop into both eyes 2 (two) times daily.     estradiol (ESTRACE) 0.1 MG/GM vaginal cream Place vaginally.     hydroquinone  4 % cream APPLY TOPICALLY TWICE A DAY 28.35 g 3   hydrOXYzine (VISTARIL) 25 MG capsule Take 1 capsule (25 mg total) by mouth at bedtime as needed. 10 capsule 0   ketoconazole (NIZORAL) 2 % shampoo Apply 1 Application topically 2 (two) times a week.     minoxidil (ROGAINE) 2 % external solution Apply topically 2 (two) times daily.     nabumetone  (RELAFEN ) 500 MG tablet Take 1 tablet (500 mg total) by mouth 2 (two) times daily as needed. 90 tablet 1   nitrofurantoin , macrocrystal-monohydrate, (MACROBID ) 100 MG capsule As Needed     Polyethylene Glycol 400 (BLINK TEARS OP) Apply to eye.     rosuvastatin  (CRESTOR ) 10 MG tablet Take 1 tablet (10 mg total) by mouth daily. 90 tablet 3   tretinoin (RETIN-A) 0.05 % cream APPLY TO AFFECTED AREA EVERY DAY AT BEDTIME 45 g 5   Turmeric 500 MG CAPS  1-2 capsules Orally daily     No current facility-administered medications for this visit.    Allergies-reviewed and updated Allergies  Allergen Reactions   Iodine Anaphylaxis   Red Dye #40 (Allura Red) Anaphylaxis and Other (See Comments)   Erythromycin Nausea And Vomiting and Nausea Only   Penicillins Other (See Comments)   Sulfa Antibiotics Other (See Comments)   Iodinated Contrast Media Other (See Comments)    No true allergy.  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 DP 2+B LUNGS: CTAB. No wheezes ABDOMEN:  BS+, soft, NTND, No HSM, no masses EXT:  no edema MSK: no gross abnormalities. MS 5/5 all 4 NEURO: A&O x3.  CN II-XII intact.  PSYCH: normal mood. Good eye contact     Assessment and Plan   Health Maintenance counseling: 1. Anticipatory guidance: Patient counseled regarding regular dental exams q6 months, eye exams,  avoiding smoking and second hand smoke, limiting alcohol to 1 beverage per day, no illicit drugs.   2. Risk factor reduction:  Advised patient of need for regular exercise and diet rich and fruits and vegetables to reduce risk of heart attack and stroke. Exercise- encouraged.  Wt Readings from Last 3 Encounters:  05/10/24 117 lb (53.1 kg)  04/25/24 118 lb 8 oz (53.8 kg)  01/25/24 119 lb (54 kg)   3. Immunizations/screenings/ancillary studies Immunization History  Administered Date(s) Administered   Influenza Inj Mdck Quad Pf 09/29/2016   Influenza-Unspecified 09/29/2016   PFIZER(Purple  Top)SARS-COV-2 Vaccination 11/07/2019, 12/02/2019   Tdap 04/16/2012   There are no preventive care reminders to display for this patient.  4. Cervical cancer screening- utd 5. Breast cancer screening-  mammogram utd 6. Colon cancer screening - utd 7. Skin cancer screening- advised regular sunscreen use. Denies worrisome, changing, or new skin lesions.  8. Birth control/STD check- n/a 9. Osteoporosis screening- utd 10. Smoking associated screening - non smoker  Wellness examination -     CBC with Differential/Platelet -     Comprehensive metabolic panel with GFR -     Hemoglobin A1c -     Lipid panel -     TSH -     VITAMIN D  25 Hydroxy (Vit-D Deficiency, Fractures) -     Vitamin B12  Age-related osteoporosis without current pathological fracture  Vitamin D  deficiency -     VITAMIN D  25 Hydroxy (Vit-D Deficiency, Fractures)  Generalized anxiety disorder  Pure hypercholesterolemia -     Lipid panel  Primary insomnia  Other orders -     hydrOXYzine Pamoate; Take 1 capsule (25 mg total) by mouth at bedtime as needed.  Dispense: 10 capsule; Refill: 0   Wellness-anticipatory guidance.  Work on Diet/Exercise  Check CBC,CMP,lipids,TSH, A1C.  F/u 1 yr  Assessment and Plan Assessment & Plan Adult Wellness Visit   Routine wellness visit reveals fatigue and somnolence likely due to stress and sleep deprivation. Encourage regular exercise, a healthy diet, and lifestyle modifications including sleep hygiene.  Insomnia   Chronic insomnia is exacerbated by stress and irregular sleep patterns. She declined antidepressants due to concerns about dependency. Prescribe hydroxyzine for one week and recommend magnesium glycinate 500 mg at bedtime to aid sleep.  Anxiety and Depression   Anxiety and depressive symptoms are related to life stressors and concerns about cognitive decline. She declined medication due to past experiences and prefers non-pharmacological interventions. Encourage  cognitive exercises such as puzzles and spreadsheets, and advise on stress management techniques.  Menopausal Symptoms   Menopausal symptoms contribute to cognitive concerns and stress. Discuss lifestyle modifications and cognitive exercises to manage symptoms.  Degenerative Spine Disease   Back pain is exacerbated by recent physical activity related to moving. Awaiting appt w/Dr. Colon  Knee Pain   Chronic knee pain is managed with turmeric.  Coronary Artery Disease with Coronary Artery Calcification   Coronary artery disease with coronary artery calcification is being actively managed. Recent blood work and statin therapy adjustment were discussed with the cardiologist. No new symptoms reported.  Hyperlipidemia   LDL levels were previously elevated but are now managed with rosuvastatin  10 mg. No adverse effects reported. Agreed to increase the statin dose after liver function was confirmed normal by the gastroenterologist.  Osteoporosis   Osteoporosis raises concerns about calcium  supplementation and its potential risks. Advise dietary sources of calcium  over supplements.  On Fosamax     Recommended follow up: Return in about 1 year (around 05/10/2025) for annual physical.  Lab/Order associations:+ fasting  Jenkins CHRISTELLA Carrel, MD

## 2024-05-11 ENCOUNTER — Ambulatory Visit: Payer: Self-pay | Admitting: Family Medicine

## 2024-05-11 LAB — CBC WITH DIFFERENTIAL/PLATELET
Absolute Lymphocytes: 1763 {cells}/uL (ref 850–3900)
Absolute Monocytes: 390 {cells}/uL (ref 200–950)
Basophils Absolute: 21 {cells}/uL (ref 0–200)
Basophils Relative: 0.4 %
Eosinophils Absolute: 140 {cells}/uL (ref 15–500)
Eosinophils Relative: 2.7 %
HCT: 43.4 % (ref 35.0–45.0)
Hemoglobin: 14.9 g/dL (ref 11.7–15.5)
MCH: 31.6 pg (ref 27.0–33.0)
MCHC: 34.3 g/dL (ref 32.0–36.0)
MCV: 92.1 fL (ref 80.0–100.0)
MPV: 11.2 fL (ref 7.5–12.5)
Monocytes Relative: 7.5 %
Neutro Abs: 2886 {cells}/uL (ref 1500–7800)
Neutrophils Relative %: 55.5 %
Platelets: 239 Thousand/uL (ref 140–400)
RBC: 4.71 Million/uL (ref 3.80–5.10)
RDW: 12.2 % (ref 11.0–15.0)
Total Lymphocyte: 33.9 %
WBC: 5.2 Thousand/uL (ref 3.8–10.8)

## 2024-05-11 LAB — HEMOGLOBIN A1C
Hgb A1c MFr Bld: 5.1 % (ref <5.7)
Mean Plasma Glucose: 100 mg/dL
eAG (mmol/L): 5.5 mmol/L

## 2024-05-11 LAB — LIPID PANEL
Cholesterol: 169 mg/dL (ref <200)
HDL: 79 mg/dL (ref >=50)
LDL Cholesterol (Calc): 76 mg/dL
Non-HDL Cholesterol (Calc): 90 mg/dL (ref <130)
Total CHOL/HDL Ratio: 2.1 (calc) (ref <5.0)
Triglycerides: 49 mg/dL (ref <150)

## 2024-05-11 LAB — COMPREHENSIVE METABOLIC PANEL WITH GFR
AG Ratio: 2 (calc) (ref 1.0–2.5)
ALT: 19 U/L (ref 6–29)
AST: 24 U/L (ref 10–35)
Albumin: 4.8 g/dL (ref 3.6–5.1)
Alkaline phosphatase (APISO): 72 U/L (ref 37–153)
BUN: 11 mg/dL (ref 7–25)
CO2: 25 mmol/L (ref 20–32)
Calcium: 9.3 mg/dL (ref 8.6–10.4)
Chloride: 105 mmol/L (ref 98–110)
Creat: 0.67 mg/dL (ref 0.50–1.05)
Globulin: 2.4 g/dL (ref 1.9–3.7)
Glucose, Bld: 82 mg/dL (ref 65–99)
Potassium: 4.2 mmol/L (ref 3.5–5.3)
Sodium: 139 mmol/L (ref 135–146)
Total Bilirubin: 0.6 mg/dL (ref 0.2–1.2)
Total Protein: 7.2 g/dL (ref 6.1–8.1)
eGFR: 98 mL/min/1.73m2 (ref >=60)

## 2024-05-11 LAB — TSH: TSH: 0.94 m[IU]/L (ref 0.40–4.50)

## 2024-05-11 LAB — VITAMIN B12: Vitamin B-12: >2000 pg/mL — ABNORMAL HIGH (ref 200–1100)

## 2024-05-11 LAB — VITAMIN D 25 HYDROXY (VIT D DEFICIENCY, FRACTURES): Vit D, 25-Hydroxy: 88 ng/mL (ref 30–100)

## 2024-05-11 NOTE — Progress Notes (Signed)
 Forwarding to cardiology to see if they want to adjust meds B12 too high-can stop or cut bac D too high.  Can cut back to twice/week if taking supplements

## 2024-05-17 ENCOUNTER — Telehealth: Payer: Self-pay

## 2024-05-17 NOTE — Telephone Encounter (Signed)
 Ordering provider: Dr. Loni  Associated diagnoses: Primary insomnia [F51.01]; Snoring [R06.83]  Patient NOT notified of PIN (1234) on 05/17/2024  I left voicemail for patient to call us  back  Phone note routed to covering staff for follow-up.

## 2024-05-21 ENCOUNTER — Encounter (INDEPENDENT_AMBULATORY_CARE_PROVIDER_SITE_OTHER): Payer: Self-pay | Admitting: Ophthalmology

## 2024-05-21 ENCOUNTER — Ambulatory Visit (INDEPENDENT_AMBULATORY_CARE_PROVIDER_SITE_OTHER): Payer: BC Managed Care – PPO | Admitting: Ophthalmology

## 2024-05-21 DIAGNOSIS — Z9889 Other specified postprocedural states: Secondary | ICD-10-CM

## 2024-05-21 DIAGNOSIS — H43813 Vitreous degeneration, bilateral: Secondary | ICD-10-CM

## 2024-05-21 DIAGNOSIS — H33312 Horseshoe tear of retina without detachment, left eye: Secondary | ICD-10-CM | POA: Diagnosis not present

## 2024-05-21 DIAGNOSIS — H35371 Puckering of macula, right eye: Secondary | ICD-10-CM

## 2024-05-21 DIAGNOSIS — H25813 Combined forms of age-related cataract, bilateral: Secondary | ICD-10-CM

## 2024-05-21 DIAGNOSIS — H04123 Dry eye syndrome of bilateral lacrimal glands: Secondary | ICD-10-CM

## 2024-05-22 DIAGNOSIS — H16143 Punctate keratitis, bilateral: Secondary | ICD-10-CM | POA: Diagnosis not present

## 2024-05-24 NOTE — Telephone Encounter (Signed)
**Note De-Identified Akon Reinoso Obfuscation** The pt states that she will be coming by the office to pick up a WatchPAT One-HST Device anyday M-F from 8 am until 5 pm. She verbalized understanding and thanked me for calling her.

## 2024-05-26 ENCOUNTER — Encounter (INDEPENDENT_AMBULATORY_CARE_PROVIDER_SITE_OTHER): Payer: Self-pay | Admitting: Ophthalmology

## 2024-07-10 ENCOUNTER — Other Ambulatory Visit: Payer: Self-pay | Admitting: *Deleted

## 2024-07-10 DIAGNOSIS — I2510 Atherosclerotic heart disease of native coronary artery without angina pectoris: Secondary | ICD-10-CM

## 2024-07-10 DIAGNOSIS — E7800 Pure hypercholesterolemia, unspecified: Secondary | ICD-10-CM

## 2024-07-10 DIAGNOSIS — M5416 Radiculopathy, lumbar region: Secondary | ICD-10-CM | POA: Diagnosis not present

## 2024-08-20 LAB — LIPID PANEL
Chol/HDL Ratio: 2.5 ratio (ref 0.0–4.4)
Cholesterol, Total: 162 mg/dL (ref 100–199)
HDL: 66 mg/dL
LDL Chol Calc (NIH): 80 mg/dL (ref 0–99)
Triglycerides: 83 mg/dL (ref 0–149)
VLDL Cholesterol Cal: 16 mg/dL (ref 5–40)

## 2024-08-20 NOTE — Telephone Encounter (Addendum)
 Patient came to the office today to pick her itamar device today. I have provided the patient with the device and the instructions as well.   Ordering provider: LONI Associated diagnoses: Dx: Primary insomnia [F51.01]; Snoring [R06.83]  WatchPAT PA obtained on 08/20/2024 by Joshua Dalton Seip, CMA. Authorization: No; tracking ID   Patient notified of PIN (1234) on 08/20/2024 via Notification Method: in person.

## 2024-09-01 ENCOUNTER — Ambulatory Visit: Payer: Self-pay | Admitting: Internal Medicine

## 2024-09-11 ENCOUNTER — Encounter (INDEPENDENT_AMBULATORY_CARE_PROVIDER_SITE_OTHER): Payer: Self-pay | Admitting: Ophthalmology

## 2025-05-12 ENCOUNTER — Encounter: Admitting: Family Medicine

## 2025-05-21 ENCOUNTER — Encounter (INDEPENDENT_AMBULATORY_CARE_PROVIDER_SITE_OTHER): Admitting: Ophthalmology
